# Patient Record
Sex: Female | Born: 1955 | Race: White | Hispanic: No | Smoking: Former smoker
Health system: Southern US, Community
[De-identification: ages and names within clinical notes are randomized; demographics above are authoritative.]

## PROBLEM LIST (undated history)

## (undated) DIAGNOSIS — M1990 Unspecified osteoarthritis, unspecified site: Secondary | ICD-10-CM

## (undated) DIAGNOSIS — R51 Headache: Secondary | ICD-10-CM

## (undated) DIAGNOSIS — E119 Type 2 diabetes mellitus without complications: Secondary | ICD-10-CM

## (undated) DIAGNOSIS — R011 Cardiac murmur, unspecified: Secondary | ICD-10-CM

## (undated) DIAGNOSIS — I2510 Atherosclerotic heart disease of native coronary artery without angina pectoris: Secondary | ICD-10-CM

## (undated) HISTORY — DX: Type 2 diabetes mellitus without complications: E11.9

## (undated) HISTORY — PX: TUBAL LIGATION: SHX77

## (undated) HISTORY — PX: OTHER SURGICAL HISTORY: SHX169

## (undated) HISTORY — DX: Atherosclerotic heart disease of native coronary artery without angina pectoris: I25.10

---

## 1998-08-07 ENCOUNTER — Other Ambulatory Visit: Admission: RE | Admit: 1998-08-07 | Discharge: 1998-08-07 | Payer: Self-pay | Admitting: Obstetrics and Gynecology

## 2003-07-04 ENCOUNTER — Ambulatory Visit (HOSPITAL_COMMUNITY): Admission: RE | Admit: 2003-07-04 | Discharge: 2003-07-04 | Payer: Self-pay | Admitting: Rheumatology

## 2013-01-04 ENCOUNTER — Other Ambulatory Visit: Payer: Self-pay | Admitting: Gastroenterology

## 2013-01-24 ENCOUNTER — Encounter (HOSPITAL_COMMUNITY): Payer: Self-pay | Admitting: *Deleted

## 2013-01-30 ENCOUNTER — Ambulatory Visit (HOSPITAL_COMMUNITY): Payer: BC Managed Care – PPO | Admitting: Anesthesiology

## 2013-01-30 ENCOUNTER — Ambulatory Visit (HOSPITAL_COMMUNITY)
Admission: RE | Admit: 2013-01-30 | Discharge: 2013-01-30 | Disposition: A | Discharge status: Home / Self Care | Payer: BC Managed Care – PPO | Source: Ambulatory Visit | Attending: Gastroenterology | Admitting: Gastroenterology

## 2013-01-30 ENCOUNTER — Encounter (HOSPITAL_COMMUNITY): Payer: Self-pay | Admitting: Anesthesiology

## 2013-01-30 ENCOUNTER — Encounter (HOSPITAL_COMMUNITY): Payer: Self-pay | Admitting: *Deleted

## 2013-01-30 ENCOUNTER — Encounter (HOSPITAL_COMMUNITY)
Admission: RE | Disposition: A | Discharge status: Home / Self Care | Payer: Self-pay | Source: Ambulatory Visit | Attending: Gastroenterology

## 2013-01-30 DIAGNOSIS — D126 Benign neoplasm of colon, unspecified: Secondary | ICD-10-CM | POA: Insufficient documentation

## 2013-01-30 DIAGNOSIS — K5730 Diverticulosis of large intestine without perforation or abscess without bleeding: Secondary | ICD-10-CM | POA: Insufficient documentation

## 2013-01-30 DIAGNOSIS — Z1211 Encounter for screening for malignant neoplasm of colon: Secondary | ICD-10-CM | POA: Insufficient documentation

## 2013-01-30 HISTORY — DX: Cardiac murmur, unspecified: R01.1

## 2013-01-30 HISTORY — DX: Unspecified osteoarthritis, unspecified site: M19.90

## 2013-01-30 HISTORY — PX: COLONOSCOPY WITH PROPOFOL: SHX5780

## 2013-01-30 HISTORY — DX: Headache: R51

## 2013-01-30 SURGERY — COLONOSCOPY WITH PROPOFOL
Anesthesia: Monitor Anesthesia Care

## 2013-01-30 MED ORDER — SODIUM CHLORIDE 0.9 % IV SOLN: INTRAVENOUS | Status: DC

## 2013-01-30 MED ORDER — KETAMINE HCL 10 MG/ML IJ SOLN
INTRAMUSCULAR | Status: DC | PRN
  Administered 2013-01-30 (×4): 10 mg via INTRAVENOUS

## 2013-01-30 MED ORDER — PROPOFOL 10 MG/ML IV EMUL
INTRAVENOUS | Status: DC | PRN
  Administered 2013-01-30: 100 ug/kg/min via INTRAVENOUS

## 2013-01-30 MED ORDER — MIDAZOLAM HCL 5 MG/5ML IJ SOLN
INTRAMUSCULAR | Status: DC | PRN
  Administered 2013-01-30: 2 mg via INTRAVENOUS
  Administered 2013-01-30 (×2): 1 mg via INTRAVENOUS

## 2013-01-30 MED ORDER — LACTATED RINGERS IV SOLN
INTRAVENOUS | Status: DC
  Administered 2013-01-30: 12:00:00 via INTRAVENOUS

## 2013-01-30 SURGICAL SUPPLY — 21 items

## 2013-01-30 NOTE — Anesthesia Preprocedure Evaluation (Addendum)
Anesthesia Evaluation  Patient identified by MRN, date of birth, ID band Patient awake    Reviewed: Allergy & Precautions, H&P , NPO status , Patient's Chart, lab work & pertinent test results  Airway Mallampati: II TM Distance: >3 FB Neck ROM: Full    Dental  (+) Teeth Intact and Dental Advisory Given   Pulmonary neg pulmonary ROS,  breath sounds clear to auscultation  Pulmonary exam normal       Cardiovascular negative cardio ROS  + Valvular Problems/Murmurs Rhythm:Regular Rate:Normal + Systolic murmurs    Neuro/Psych  Headaches, negative psych ROS   GI/Hepatic negative GI ROS, Neg liver ROS,   Endo/Other  Morbid obesity  Renal/GU negative Renal ROS     Musculoskeletal negative musculoskeletal ROS (+)   Abdominal   Peds  Hematology negative hematology ROS (+)   Anesthesia Other Findings   Reproductive/Obstetrics negative OB ROS                         Anesthesia Physical Anesthesia Plan  ASA: II  Anesthesia Plan: MAC   Post-op Pain Management:    Induction: Intravenous  Airway Management Planned: Nasal Cannula and Simple Face Mask  Additional Equipment:   Intra-op Plan:   Post-operative Plan:   Informed Consent: I have reviewed the patients History and Physical, chart, labs and discussed the procedure including the risks, benefits and alternatives for the proposed anesthesia with the patient or authorized representative who has indicated his/her understanding and acceptance.   Dental advisory given  Plan Discussed with: CRNA  Anesthesia Plan Comments:         Anesthesia Quick Evaluation

## 2013-01-30 NOTE — Anesthesia Postprocedure Evaluation (Signed)
Anesthesia Post Note  Patient: Susan Daniels  Procedure(s) Performed: Procedure(s) (LRB): COLONOSCOPY WITH PROPOFOL (N/A)  Anesthesia type: MAC  Patient location: PACU  Post pain: Pain level controlled  Post assessment: Post-op Vital signs reviewed  Last Vitals: BP 153/75  Pulse 77  Temp(Src) 36.4 C (Oral)  Resp 17  Ht 5\' 1"  (1.549 m)  Wt 217 lb (98.431 kg)  BMI 41.02 kg/m2  SpO2 94%  Post vital signs: Reviewed  Level of consciousness: awake  Complications: No apparent anesthesia complications

## 2013-01-30 NOTE — H&P (Signed)
  Procedure: Baseline screening colonoscopy  History: The patient is a 57 year old female born July 09, 1956 who is scheduled to undergo her first screening colonoscopy polypectomy to prevent colon cancer.  Current medications: Plaquenil. Nizoral cream.  Past medical history: Connective-tissue disease with positive ANA. Osteoarthritis. Ocular migraine syndrome. Allergic rhinitis. Bilateral tubal ligation. D&C following spontaneous abortion.  Habits: The patient has never smoked cigarettes. She consumes alcohol in moderation  Allergies: Aspirin causes increased heart rate. Epinephrine causes increased heart rate and presyncopal type symptoms.  Plan: Proceed with baseline screening colonoscopy with polypectomy to prevent colon cancer.

## 2013-01-30 NOTE — Op Note (Signed)
Procedure: Baseline screening colonoscopy  Endoscopist: Danise Edge  Premedication: Propofol administered by anesthesia  Procedure: The patient was placed in the left lateral decubitus position. Anal inspection and digital rectal exam were normal. The Pentax pediatric colonoscope was introduced into the rectum and advanced to the cecum. A normal-appearing ileocecal valve and appendiceal orifice were identified. Colonic preparation for the exam today was good.  Rectum. Normal. Retroflexed view of the distal rectum normal.  Sigmoid colon and descending colon. At 40 cm from the anal verge a 3 mm sessile polyp was removed with the cold biopsy forceps and submitted for pathologic interpretation. Left colonic diverticulosis noted.  Splenic flexure. Normal.  Transverse colon. Normal.  Hepatic flexure. Normal.  Ascending colon. Normal.  Cecum and ileocecal valve. Normal.  Assessment: From the sigmoid colon at 40 cm from the anal verge a 3 mm sessile polyp was removed with the cold biopsy forceps and submitted for pathologic interpretation. Screening proctocolonoscopy to the cecum was otherwise normal.  Recommendations: If colon polyp returns neoplastic pathologically, the patient should undergo a surveillance colonoscopy in 5 years. If the colon polyp returns nonneoplastic pathologically, the patient should undergo a repeat screening colonoscopy in 10 years.

## 2013-01-30 NOTE — Transfer of Care (Signed)
Immediate Anesthesia Transfer of Care Note  Patient: Susan Daniels  Procedure(s) Performed: Procedure(s): COLONOSCOPY WITH PROPOFOL (N/A)  Patient Location: PACU and Endoscopy Unit  Anesthesia Type:MAC  Level of Consciousness: awake, alert  and patient cooperative  Airway & Oxygen Therapy: Patient Spontanous Breathing and Patient connected to face mask oxygen  Post-op Assessment: Report given to PACU RN and Post -op Vital signs reviewed and stable  Post vital signs: Reviewed and stable  Complications: No apparent anesthesia complications

## 2013-01-31 ENCOUNTER — Encounter (HOSPITAL_COMMUNITY): Payer: Self-pay | Admitting: Gastroenterology

## 2013-07-17 ENCOUNTER — Encounter: Payer: BC Managed Care – PPO | Attending: Internal Medicine

## 2013-07-17 VITALS — Ht 62.0 in | Wt 217.6 lb

## 2013-07-17 DIAGNOSIS — E119 Type 2 diabetes mellitus without complications: Secondary | ICD-10-CM

## 2013-07-17 DIAGNOSIS — Z713 Dietary counseling and surveillance: Secondary | ICD-10-CM | POA: Insufficient documentation

## 2013-07-18 NOTE — Progress Notes (Signed)
Patient was seen on 07/17/13 for the first of a series of three diabetes self-management courses at the [facility].   Current HbA1c: patient states over 11%  The following learning objectives were met by the patient during this course:   Defines the role of glucose and insulin  Identifies type of diabetes and pathophysiology  Defines the diagnostic criteria for diabetes and prediabetes  States the risk factors for Type 2 Diabetes  States the symptoms of Type 2 Diabetes  Defines Type 2 Diabetes treatment goals  Defines Type 2 Diabetes treatment options  States the rationale for glucose monitoring  Identifies A1C, glucose targets, and testing times  Identifies proper sharps disposal  Defines the purpose of a diabetes food plan  Identifies carbohydrate food groups  Defines effects of carbohydrate foods on glucose levels  Identifies carbohydrate choices/grams/food labels  States benefits of physical activity and effect on glucose  Review of suggested activity guidelines  Handouts given during class include:  Type 2 Diabetes: Basics Book  My Food Plan Book  Food and Activity Log  Your patient has identified their diabetes self-care support plan as:  [facility] support group  Continued diabetes education  Follow-Up Plan: Attend core 2 and core 3

## 2013-07-18 NOTE — Patient Instructions (Signed)
Goals:  Follow Diabetes Meal Plan as instructed  Eat 3 meals and 2 snacks, every 3-5 hrs  Limit carbohydrate intake to 30-45 grams carbohydrate/meal  Limit carbohydrate intake to 0-30 grams carbohydrate/snack  Add lean protein foods to meals/snacks  Monitor glucose levels as instructed by your doctor  Aim for 15-30 mins of physical activity daily  Bring food record and glucose log to your next nutrition visit

## 2013-08-07 DIAGNOSIS — E119 Type 2 diabetes mellitus without complications: Secondary | ICD-10-CM

## 2013-08-07 NOTE — Progress Notes (Signed)
  Patient was seen on 08/07/13 for the second of a series of three diabetes self-management courses at the [facility]. The following learning objectives were met by the patient during this course:   Explain basic nutrition maintenance and quality assurance  Describe causes, symptoms and treatment of hypoglycemia and hyperglycemia  Explain how to manage diabetes during illness  Describe the importance of good nutrition for health and healthy eating strategies  List strategies to follow meal plan when dining out  Describe the effects of alcohol on glucose and how to use it safely  Describe problem solving skills for day-to-day glucose challenges  Describe strategies to use when treatment plan needs to change  Identify important factors involved in successful weight loss  Describe ways to remain physically active  Describe the impact of regular activity on insulin resistance   Follow-Up Plan: Patient will attend the final class of the ADA Diabetes Self-Care Education.    

## 2013-09-18 ENCOUNTER — Encounter: Payer: BC Managed Care – PPO | Attending: Internal Medicine

## 2013-09-18 DIAGNOSIS — E119 Type 2 diabetes mellitus without complications: Secondary | ICD-10-CM | POA: Insufficient documentation

## 2013-09-18 DIAGNOSIS — Z713 Dietary counseling and surveillance: Secondary | ICD-10-CM | POA: Insufficient documentation

## 2013-09-19 NOTE — Progress Notes (Signed)
Patient was seen on 09/19/23 for the third of a series of three diabetes self-management courses at the [facility]. The following learning objectives were met by the patient during this class:    State the amount of activity recommended for healthy living   Describe activities suitable for individual needs   Identify ways to regularly incorporate activity into daily life   Identify barriers to activity and ways to over come these barriers  Identify diabetes medications being personally used and their primary action for lowering glucose and possible side effects   Describe role of stress on blood glucose and develop strategies to address psychosocial issues   Identify diabetes complications and ways to prevent them  Explain how to manage diabetes during illness   Evaluate success in meeting personal goal   Establish 2-3 goals that they will plan to diligently work on until they return for the free 51-month follow-up visit  Your patient has established the following 4 month goals in their individualized success plan: I will count my carb choices at most meals and snacks I will increase my activity  20 minutes at least 3 days a week  Your patient has identified these potential barriers to change:  These behavior goals are very important to me on a scale of 10/10  I am moderately confident that I can accomplish them on a scale of 5/10 due to "life"  Your patient has identified their diabetes self-care support plan as  None stated, Support group available

## 2014-01-15 ENCOUNTER — Ambulatory Visit: Payer: BC Managed Care – PPO | Admitting: *Deleted

## 2016-05-24 DIAGNOSIS — E782 Mixed hyperlipidemia: Secondary | ICD-10-CM | POA: Diagnosis not present

## 2016-05-24 DIAGNOSIS — E1165 Type 2 diabetes mellitus with hyperglycemia: Secondary | ICD-10-CM | POA: Diagnosis not present

## 2016-05-24 DIAGNOSIS — Z79899 Other long term (current) drug therapy: Secondary | ICD-10-CM | POA: Diagnosis not present

## 2016-05-24 DIAGNOSIS — E559 Vitamin D deficiency, unspecified: Secondary | ICD-10-CM | POA: Diagnosis not present

## 2016-12-03 DIAGNOSIS — Z6837 Body mass index (BMI) 37.0-37.9, adult: Secondary | ICD-10-CM | POA: Diagnosis not present

## 2016-12-03 DIAGNOSIS — Z124 Encounter for screening for malignant neoplasm of cervix: Secondary | ICD-10-CM | POA: Diagnosis not present

## 2016-12-03 DIAGNOSIS — Z01419 Encounter for gynecological examination (general) (routine) without abnormal findings: Secondary | ICD-10-CM | POA: Diagnosis not present

## 2017-02-02 DIAGNOSIS — L249 Irritant contact dermatitis, unspecified cause: Secondary | ICD-10-CM | POA: Diagnosis not present

## 2017-02-15 DIAGNOSIS — L249 Irritant contact dermatitis, unspecified cause: Secondary | ICD-10-CM | POA: Diagnosis not present

## 2017-03-04 DIAGNOSIS — H524 Presbyopia: Secondary | ICD-10-CM | POA: Diagnosis not present

## 2017-03-07 DIAGNOSIS — E782 Mixed hyperlipidemia: Secondary | ICD-10-CM | POA: Diagnosis not present

## 2017-03-07 DIAGNOSIS — M1711 Unilateral primary osteoarthritis, right knee: Secondary | ICD-10-CM | POA: Diagnosis not present

## 2017-03-07 DIAGNOSIS — Z79899 Other long term (current) drug therapy: Secondary | ICD-10-CM | POA: Diagnosis not present

## 2017-03-07 DIAGNOSIS — E559 Vitamin D deficiency, unspecified: Secondary | ICD-10-CM | POA: Diagnosis not present

## 2017-03-07 DIAGNOSIS — E1165 Type 2 diabetes mellitus with hyperglycemia: Secondary | ICD-10-CM | POA: Diagnosis not present

## 2017-03-07 DIAGNOSIS — Z0000 Encounter for general adult medical examination without abnormal findings: Secondary | ICD-10-CM | POA: Diagnosis not present

## 2017-03-07 DIAGNOSIS — M359 Systemic involvement of connective tissue, unspecified: Secondary | ICD-10-CM | POA: Diagnosis not present

## 2017-03-25 DIAGNOSIS — M1711 Unilateral primary osteoarthritis, right knee: Secondary | ICD-10-CM | POA: Diagnosis not present

## 2017-06-01 NOTE — Progress Notes (Signed)
Office Visit Note  Patient: Susan Daniels             Date of Birth: [birth year]           MRN: [id number]             PCP: Josetta Huddle, MD Referring: Josetta Huddle, MD Visit Date: 06/03/2017 Occupation: _0 @    Subjective:  Pain in joints   History of Present Illness: Susan Daniels is a 61 y.o. female seen in consultation per request of Dr. Josetta Huddle. According to patient her symptoms started age 32 at the time she was under care of Dr. Rockwell Alexandria. He diagnosed her with rheumatoid arthritis at the time she was having right knee joint pain and pain in all other joints. She was started on Plaquenil. Once a retired she was under care of Dr. Lenna Gilford who diagnosed her with autoimmune disease. Plaquenil was continued. After that Dr. Lenna Gilford moved on and patient continued to get refill on Plaquenil through Dr. Inda Merlin. She believes that she is doing quite well on Plaquenil without any joint swelling. She's had some discomfort in her right knee joint for which she had a cortisone injection about a month ago by Dr. Ricki Rodriguez.  Her knee joint is doing better. She has some discomfort in her left CMC joint.  Activities of Daily Living:  Patient reports morning stiffness for 0 minute.   Patient Reports nocturnal pain.  Difficulty dressing/grooming: Denies Difficulty climbing stairs: Denies Difficulty getting out of chair: Denies Difficulty using hands for taps, buttons, cutlery, and/or writing: Denies   Review of Systems  Constitutional: Negative.  Negative for fatigue.  HENT: Negative for mouth dryness.   Eyes: Negative for dryness.  Respiratory: Negative.  Negative for cough, shortness of breath and difficulty breathing.   Cardiovascular: Negative.  Negative for palpitations, hypertension and irregular heartbeat.  Gastrointestinal: Negative.  Negative for blood in stool, constipation and diarrhea.  Endocrine: Negative.   Genitourinary: Negative.  Negative for nocturia.    Musculoskeletal: Positive for arthralgias, joint pain and joint swelling. Negative for myalgias, muscle weakness, morning stiffness and myalgias.  Skin: Positive for hair loss and sensitivity to sunlight. Negative for color change, rash and ulcers.  Neurological: Negative.  Negative for headaches.  Hematological: Negative.  Negative for swollen glands.  Psychiatric/Behavioral: Negative.  Negative for depressed mood and sleep disturbance. The patient is not nervous/anxious.     PMFS History:  Patient Active Problem List   Diagnosis Date Noted  . Autoimmune disease ([place]) 06/03/2017  . High risk medication use 06/03/2017  . Primary osteoarthritis of both hands 06/03/2017  . Primary osteoarthritis of both knees 06/03/2017  . Primary osteoarthritis of both feet 06/03/2017  . Heart murmur 06/03/2017  . History of hypothyroidism 06/03/2017  . Dyslipidemia 06/03/2017  . History of diabetes mellitus 06/03/2017  . Vitamin D deficiency 06/03/2017    Past Medical History:  Diagnosis Date  . Arthritis    Rheumatoid  . Diabetes mellitus without complication ([place])   . Headache(784.0)    migraines  . Heart murmur     History reviewed. No pertinent family history. Past Surgical History:  Procedure Laterality Date  . COLONOSCOPY WITH PROPOFOL N/A 01/30/2013   Procedure: COLONOSCOPY WITH PROPOFOL;  Surgeon: Garlan Fair, MD;  Location: [facility];  Service: Endoscopy;  Laterality: N/A;  . TUBAL LIGATION     Social History   Social History Narrative  . No narrative on file     Objective: Vital  Signs: BP 133/69   Pulse 79   Resp 14   Ht 5' 2" (1.575 m)   Wt 199 lb (90.3 kg)   BMI 36.40 kg/m    Physical Exam  Constitutional: She is oriented to person, place, and time. She appears well-developed and well-nourished.  HENT:  Head: Normocephalic and atraumatic.  Eyes: Conjunctivae and EOM are normal.  Neck: Normal range of motion.  Cardiovascular: Normal rate, regular rhythm,  normal heart sounds and intact distal pulses.   Pulmonary/Chest: Effort normal and breath sounds normal.  Abdominal: Soft. Bowel sounds are normal.  Lymphadenopathy:    She has no cervical adenopathy.  Neurological: She is alert and oriented to person, place, and time.  Skin: Skin is warm and dry. Capillary refill takes less than 2 seconds.  Psychiatric: She has a normal mood and affect. Her behavior is normal.  Nursing note and vitals reviewed.    Musculoskeletal Exam: C-spine and thoracic lumbar spine good range of motion. Shoulder joints elbow joints wrist joints are good range of motion. She is some thickening of CMC PIP/DIP joints with tenderness over left CMC joint. No synovitis was noted. Hip joints are good range of motion. Her right knee joint had limited extension, without effusion. Ankle joints are good range of motion. She has bilateral MTP PIP/DIP thickening consistent with osteoarthritis no synovitis was noted.  CDAI Exam: CDAI Homunculus Exam:   Tenderness:  LUE: carpometacarpal RLE: tibiofemoral  Joint Counts:  CDAI Tender Joint count: 1 CDAI Swollen Joint count: 0  Global Assessments:  Patient Global Assessment: 1 Provider Global Assessment: 1  CDAI Calculated Score: 3    Investigation: Findings:  03/08/2017 CBC normal, CMP normal except GFR 57, hemoglobin A1c 7.6 lipid panel normal, TSH normal, UA positive for bacteria treated with doxycycline vitamin D 34.6,   01/26/2010 ANA positive no titer or pattern available, C3-C4 normal RF negative, anti-CCP negative, ESR normal, CRP normal    Imaging: Xr Foot 2 Views Left  Result Date: 06/03/2017 Left first MTP narrowing and subluxation noted PIP/DIP narrowing was noted that of the other MTP narrowing was noted. No erosive changes were noted. No intertarsal joint space narrowing was noted. A small calcaneal spur was noted. Impression: These findings are consistent with osteoarthritis of the foot.  Xr Foot 2  Views Right  Result Date: 06/03/2017 First MTP narrowing and subluxation was noted. All PIP/DIP narrowing was noted. Possible erosion was noted over right fifth MTP joint. No intertarsal joint space narrowing was noted. A small calcaneal spur was noted. Impression: These findings are consistent with osteoarthritis and possible inflammatory arthritis  Xr Hand 2 View Left  Result Date: 06/03/2017 Left second and third MCP narrowing was noted. Juxta articular osteopenia was noted. She also has PIP/DIP narrowing and left CMC severe narrowing. No erosive changes were noted.  These findings are consistent with inflammatory arthritis and osteoarthritis overlap.  Xr Hand 2 View Right  Result Date: 06/03/2017 Right second and third MCP narrowing was noted. Juxta articular osteopenia was noted. She also has PIP/DIP narrowing and left CMC severe narrowing. No erosive changes were noted.  These findings are consistent with inflammatory arthritis and osteoarthritis overlap   Speciality Comments: No specialty comments available.    Procedures:  No procedures performed Allergies: Aspirin; Epinephrine; and Hydrocodone   Assessment / Plan:     Visit Diagnoses: Autoimmune disease ([place]) - History of rash, arthralgias, positive ANA, for sensitivity, treated by Dr. Trudie Reed from October 2016 to April 2017 Plaquenil -  patient has continued Plaquenil through Dr. Inda Merlin. She states she's been tolerating the medication well and her arthritis is been very well-controlled on Plaquenil. She has no synovitis on examination today. The plan is to continue her on Plaquenil. Her dose will be Plaquenil 200 mg by mouth twice a day Monday to Friday based on her height. I will check following labs to look for any underlying autoimmune antibodies. Plan: Urinalysis, Routine w reflex microscopic, Sedimentation rate, Rheumatoid factor, Cyclic citrul peptide antibody, IgG, 14-3-3 eta Protein, ANA, C3 and C4, [id number] ENA PANEL  High  risk medication use - Plan: Serum protein electrophoresis with reflex, CBC with Differential/Platelet, COMPLETE METABOLIC PANEL WITH GFR. Her next labs will be in October. She should get yearly eye exam. She states her last eye exam was within 3 months. I've given her a form so she can fill and send it to [place].  Pain in both hands - Plan: XR Hand 2 View Right, XR Hand 2 View Left  Primary osteoarthritis of both hands: She is been having chronic pain in her hands. A list of natural anti-inflammatories were given. She's been having discomfort in her left CMC joint. Given her a prescription for Voltaren gel that can be applied topically and also prescription for left CMC brace.  Primary osteoarthritis of both knees: She had increased right knee joint pain and recently which responded to cortisone injections she  has limited extension of her right knee. Patient will bring x-rays of her knee joints next visit.  Pain in both feet - Plan: XR Foot 2 Views Right, XR Foot 2 Views Left  Primary osteoarthritis of both feet: Proper fitting shoes were discussed.  Vitamin D deficiency: She is on supplement.  Her other medical problems are listed as follows:  History of diabetes mellitus  Dyslipidemia  History of hypothyroidism  Heart murmur    Orders: Orders Placed This Encounter  Procedures  . XR Hand 2 View Right  . XR Hand 2 View Left  . XR Foot 2 Views Right  . XR Foot 2 Views Left  . Urinalysis, Routine w reflex microscopic  . Sedimentation rate  . Rheumatoid factor  . Cyclic citrul peptide antibody, IgG  . 14-3-3 eta Protein  . Serum protein electrophoresis with reflex  . ANA  . C3 and C4  . [id number] ENA PANEL  . CBC with Differential/Platelet  . COMPLETE METABOLIC PANEL WITH GFR   Meds ordered this encounter  Medications  . diclofenac sodium (VOLTAREN) 1 % GEL    Sig: Apply 3 gm to 3 large joints up to 3 times a day.Dispense 3 tubes with 3 refills.    Dispense:  3 Tube     Refill:  1    Face-to-face time spent with patient was 50 minutes. Greater than 50% of time was spent in counseling and coordination of care.  Follow-Up Instructions: Return in about 3 months (around 09/03/2017) for Autoimmune disease.   Bo Merino, MD  Note - This record has been created using Editor, commissioning.  Chart creation errors have been sought, but may not always  have been located. Such creation errors do not reflect on  the standard of medical care.

## 2017-06-03 ENCOUNTER — Encounter: Payer: Self-pay | Admitting: Rheumatology

## 2017-06-03 ENCOUNTER — Ambulatory Visit (INDEPENDENT_AMBULATORY_CARE_PROVIDER_SITE_OTHER): Payer: Self-pay

## 2017-06-03 ENCOUNTER — Ambulatory Visit (INDEPENDENT_AMBULATORY_CARE_PROVIDER_SITE_OTHER): Payer: BLUE CROSS/BLUE SHIELD | Admitting: Rheumatology

## 2017-06-03 VITALS — BP 133/69 | HR 79 | Resp 14 | Ht 62.0 in | Wt 199.0 lb

## 2017-06-03 DIAGNOSIS — M79671 Pain in right foot: Secondary | ICD-10-CM

## 2017-06-03 DIAGNOSIS — M19041 Primary osteoarthritis, right hand: Secondary | ICD-10-CM

## 2017-06-03 DIAGNOSIS — Z79899 Other long term (current) drug therapy: Secondary | ICD-10-CM

## 2017-06-03 DIAGNOSIS — M19042 Primary osteoarthritis, left hand: Secondary | ICD-10-CM

## 2017-06-03 DIAGNOSIS — M19071 Primary osteoarthritis, right ankle and foot: Secondary | ICD-10-CM | POA: Diagnosis not present

## 2017-06-03 DIAGNOSIS — M79641 Pain in right hand: Secondary | ICD-10-CM

## 2017-06-03 DIAGNOSIS — D8989 Other specified disorders involving the immune mechanism, not elsewhere classified: Secondary | ICD-10-CM

## 2017-06-03 DIAGNOSIS — Z8639 Personal history of other endocrine, nutritional and metabolic disease: Secondary | ICD-10-CM | POA: Insufficient documentation

## 2017-06-03 DIAGNOSIS — R011 Cardiac murmur, unspecified: Secondary | ICD-10-CM | POA: Diagnosis not present

## 2017-06-03 DIAGNOSIS — E559 Vitamin D deficiency, unspecified: Secondary | ICD-10-CM | POA: Diagnosis not present

## 2017-06-03 DIAGNOSIS — M79642 Pain in left hand: Secondary | ICD-10-CM | POA: Diagnosis not present

## 2017-06-03 DIAGNOSIS — M170 Bilateral primary osteoarthritis of knee: Secondary | ICD-10-CM | POA: Diagnosis not present

## 2017-06-03 DIAGNOSIS — M79672 Pain in left foot: Secondary | ICD-10-CM

## 2017-06-03 DIAGNOSIS — E785 Hyperlipidemia, unspecified: Secondary | ICD-10-CM | POA: Insufficient documentation

## 2017-06-03 DIAGNOSIS — M19072 Primary osteoarthritis, left ankle and foot: Secondary | ICD-10-CM

## 2017-06-03 DIAGNOSIS — M359 Systemic involvement of connective tissue, unspecified: Secondary | ICD-10-CM

## 2017-06-03 MED ORDER — DICLOFENAC SODIUM 1 % TD GEL: TRANSDERMAL | 1 refills | Status: DC

## 2017-06-03 NOTE — Patient Instructions (Signed)
Standing Labs We placed an order today for your standing lab work.    Please come back and get your standing labs in October  We have open lab Monday through Friday from 8:30-11:30 AM and 1:30-4 PM at the office of Dr. Bo Merino.   The office is located at [address], [place], [place], [place] [address] No appointment is necessary.   Labs are drawn by [facility].  You may receive a bill from [facility] for your lab work. If you have any questions regarding directions or hours of operation,  please call [phone].   Natural anti-inflammatories  You can purchase these at [employer], [employer] or online.  . Turmeric (capsules)  . Ginger (ginger root or capsules)  . Omega 3 (Fish, flax seeds, chia seeds, walnuts, almonds)  . Tart cherry (dried or extract)   Patient should be under the care of a physician while taking these supplements. This may not be reproduced without the permission of Dr. Bo Merino.

## 2017-06-04 LAB — URINALYSIS, ROUTINE W REFLEX MICROSCOPIC
Bilirubin Urine: NEGATIVE
Hgb urine dipstick: NEGATIVE
KETONES UR: NEGATIVE
NITRITE: NEGATIVE
PH: 5 (ref 5.0–8.0)
Protein, ur: NEGATIVE
SPECIFIC GRAVITY, URINE: 1.013 (ref 1.001–1.035)

## 2017-06-04 LAB — URINALYSIS, MICROSCOPIC ONLY
Casts: NONE SEEN [LPF]
Crystals: NONE SEEN [HPF]
RBC / HPF: NONE SEEN RBC/HPF (ref <=2)
SQUAMOUS EPITHELIAL / LPF: NONE SEEN [HPF] (ref <=5)
Yeast: NONE SEEN [HPF]

## 2017-06-04 LAB — SEDIMENTATION RATE: Sed Rate: 6 mm/hr (ref 0–30)

## 2017-06-06 LAB — ANTI-NUCLEAR AB-TITER (ANA TITER)

## 2017-06-06 LAB — CP5000020 ENA PANEL
ENA SM Ab Ser-aCnc: <1
Ribonucleic Protein(ENA) Antibody, IgG: <1
SSA (Ro) (ENA) Antibody, IgG: <1
SSB (La) (ENA) Antibody, IgG: <1
Scleroderma (Scl-70) (ENA) Antibody, IgG: <1
ds DNA Ab: <1 IU/mL

## 2017-06-06 LAB — RHEUMATOID FACTOR

## 2017-06-06 LAB — C3 AND C4
C3 Complement: 159 mg/dL (ref 83–193)
C4 COMPLEMENT: 30 mg/dL (ref 15–57)

## 2017-06-06 LAB — CYCLIC CITRUL PEPTIDE ANTIBODY, IGG

## 2017-06-06 LAB — ANA: ANA: POSITIVE — AB

## 2017-06-07 LAB — 14-3-3 ETA PROTEIN: 14-3-3 eta Protein: <0.2 ng/mL (ref <0.2)

## 2017-06-08 LAB — PROTEIN ELECTROPHORESIS, SERUM, WITH REFLEX
ALBUMIN ELP: 4.1 g/dL (ref 3.8–4.8)
Alpha-1-Globulin: 0.3 g/dL (ref 0.2–0.3)
Alpha-2-Globulin: 0.7 g/dL (ref 0.5–0.9)
BETA GLOBULIN: 0.5 g/dL (ref 0.4–0.6)
Beta 2: 0.4 g/dL (ref 0.2–0.5)
Gamma Globulin: 0.8 g/dL (ref 0.8–1.7)
TOTAL PROTEIN, SERUM ELECTROPHOR: 6.6 g/dL (ref 6.1–8.1)

## 2017-06-08 LAB — IFE INTERPRETATION: Immunofix Electr Int: NOT DETECTED

## 2017-06-08 NOTE — Progress Notes (Signed)
Please add anti-cardiolipin, beta-2, lupus anticoagulant

## 2017-06-09 ENCOUNTER — Other Ambulatory Visit: Payer: Self-pay | Admitting: Radiology

## 2017-06-09 ENCOUNTER — Telehealth: Payer: Self-pay

## 2017-06-09 MED ORDER — DICLOFENAC SODIUM 1 % TD GEL: TRANSDERMAL | 3 refills | Status: DC

## 2017-06-09 NOTE — Telephone Encounter (Signed)
Resent Rx per [facility] the sig states 3 refills, but only 1 refill showed in the refill spot. Have resent to correctly reflect the 3 refills indicated on Rx

## 2017-06-09 NOTE — Telephone Encounter (Signed)
Received a prior authorization request for Voltaren Gel though cover my meds. The request asks if patient has tried two different prescription strength generic NSAIDs for her condition. Called patient to verify what she has used in the past. Left message for patient to call back. Will continue with authorization once we hear back from her.   Inocente Krach, [place], CPhT 1:37 PM

## 2017-06-10 ENCOUNTER — Other Ambulatory Visit: Payer: Self-pay | Admitting: *Deleted

## 2017-06-10 DIAGNOSIS — M2550 Pain in unspecified joint: Secondary | ICD-10-CM

## 2017-06-10 NOTE — Telephone Encounter (Signed)
Patient returned call. She states that she has not taken any NSAIDs or Rx strength medications in the past for her OA. Will will contact her once we receive a response. Patient voiced understanding and denied any questions at this time.    The authorization was submitted to her insurance via cover my meds. Will update once we receive a response.  Antigone Crowell, [place], CPhT 4:18 PM

## 2017-06-13 NOTE — Telephone Encounter (Signed)
Received a fax from [place] regarding a prior authorization DENIAL for Voltaren Gel. Patients plan requires her to try two different prescription strength NSAIDs first.    Reference [id number] Phone number: [phone]  Will scan document into epic.  Raelan Burgoon, [place], CPhT 10:35 AM

## 2017-06-14 DIAGNOSIS — L237 Allergic contact dermatitis due to plants, except food: Secondary | ICD-10-CM | POA: Diagnosis not present

## 2017-06-27 ENCOUNTER — Other Ambulatory Visit: Payer: Self-pay | Admitting: Radiology

## 2017-06-27 DIAGNOSIS — Z79899 Other long term (current) drug therapy: Secondary | ICD-10-CM | POA: Diagnosis not present

## 2017-06-27 DIAGNOSIS — M2550 Pain in unspecified joint: Secondary | ICD-10-CM

## 2017-06-27 LAB — CBC WITH DIFFERENTIAL/PLATELET
BASOS ABS: 59 {cells}/uL (ref 0–200)
BASOS PCT: 1 %
EOS ABS: 354 {cells}/uL (ref 15–500)
Eosinophils Relative: 6 %
HEMATOCRIT: 39.4 % (ref 35.0–45.0)
Hemoglobin: 12.9 g/dL (ref 11.7–15.5)
LYMPHS PCT: 23 %
Lymphs Abs: 1357 cells/uL (ref 850–3900)
MCH: 30.3 pg (ref 27.0–33.0)
MCHC: 32.7 g/dL (ref 32.0–36.0)
MCV: 92.5 fL (ref 80.0–100.0)
MONO ABS: 413 {cells}/uL (ref 200–950)
MPV: 11.2 fL (ref 7.5–12.5)
Monocytes Relative: 7 %
NEUTROS PCT: 63 %
Neutro Abs: 3717 cells/uL (ref 1500–7800)
Platelets: 244 10*3/uL (ref 140–400)
RBC: 4.26 MIL/uL (ref 3.80–5.10)
RDW: 13.6 % (ref 11.0–15.0)
WBC: 5.9 10*3/uL (ref 3.8–10.8)

## 2017-06-28 LAB — COMPLETE METABOLIC PANEL WITH GFR
ALT: 17 U/L (ref 6–29)
AST: 12 U/L (ref 10–35)
Albumin: 4.2 g/dL (ref 3.6–5.1)
Alkaline Phosphatase: 53 U/L (ref 33–130)
BUN: 13 mg/dL (ref 7–25)
CALCIUM: 9.3 mg/dL (ref 8.6–10.4)
CHLORIDE: 103 mmol/L (ref 98–110)
CO2: 27 mmol/L (ref 20–32)
CREATININE: 1.03 mg/dL — AB (ref 0.50–0.99)
GFR, Est African American: 68 mL/min (ref >=60)
GFR, Est Non African American: 59 mL/min — ABNORMAL LOW (ref >=60)
GLUCOSE: 260 mg/dL — AB (ref 65–99)
Potassium: 4.4 mmol/L (ref 3.5–5.3)
Sodium: 139 mmol/L (ref 135–146)
TOTAL PROTEIN: 6.4 g/dL (ref 6.1–8.1)
Total Bilirubin: 0.5 mg/dL (ref 0.2–1.2)

## 2017-06-28 LAB — BETA-2 GLYCOPROTEIN ANTIBODIES
Beta-2 Glyco I IgG: <9 SGU (ref <=20)
Beta-2-Glycoprotein I IgM: <9 SMU (ref <=20)

## 2017-06-29 LAB — RFX PTT-LA W/RFX TO HEX PHASE CONF: PTT-LA Screen: 40 s (ref <=40)

## 2017-06-29 LAB — RFX DRVVT SCR W/RFLX CONF 1:1 MIX: DRVVT SCREEN: 44 s (ref <=45)

## 2017-06-29 LAB — LUPUS ANTICOAGULANT PANEL

## 2017-06-30 LAB — CARDIOLIPIN ANTIBODIES, IGG, IGM, IGA

## 2017-06-30 NOTE — Progress Notes (Signed)
Will discuss labs at the follow-up visit

## 2017-07-01 ENCOUNTER — Ambulatory Visit: Payer: Self-pay | Admitting: Rheumatology

## 2017-09-03 NOTE — Progress Notes (Signed)
Office Visit Note  Patient: Susan Daniels             Date of Birth: 1955-12-24           MRN: [id number]             PCP: Josetta Huddle, MD Referring: Josetta Huddle, MD Visit Date: 09/14/2017 Occupation: _0 @    Subjective:  Right knee pain   History of Present Illness: Susan Daniels is a 61 y.o. female with history of autoimmune disease and osteoarthritis. She continues to have some discomfort in her right knee joint. She's been tolerating Plaquenil well. She denies any joint swelling.  Activities of Daily Living:  Patient reports morning stiffness for 2 minutes.   Patient Denies nocturnal pain.  Difficulty dressing/grooming: Denies Difficulty climbing stairs: Reports Difficulty getting out of chair: Denies Difficulty using hands for taps, buttons, cutlery, and/or writing: Denies   Review of Systems  Constitutional: Negative for fatigue, night sweats, weight gain, weight loss and weakness.  HENT: Negative for mouth sores, trouble swallowing, trouble swallowing, mouth dryness and nose dryness.   Eyes: Negative for pain, redness, visual disturbance and dryness.  Respiratory: Negative for cough, shortness of breath and difficulty breathing.   Cardiovascular: Negative for chest pain, palpitations, hypertension, irregular heartbeat and swelling in legs/feet.  Gastrointestinal: Negative for blood in stool, constipation and diarrhea.  Endocrine: Negative for increased urination.  Genitourinary: Negative for vaginal dryness.  Musculoskeletal: Positive for arthralgias, joint pain and morning stiffness. Negative for joint swelling, myalgias, muscle weakness, muscle tenderness and myalgias.  Skin: Negative for color change, rash, hair loss, skin tightness, ulcers and sensitivity to sunlight.  Allergic/Immunologic: Negative for susceptible to infections.  Neurological: Negative for dizziness, memory loss and night sweats.  Hematological: Negative for swollen glands.    Psychiatric/Behavioral: Negative for depressed mood and sleep disturbance. The patient is not nervous/anxious.     PMFS History:  Patient Active Problem List   Diagnosis Date Noted  . Autoimmune disease ([place]) 06/03/2017  . High risk medication use 06/03/2017  . Primary osteoarthritis of both hands 06/03/2017  . Primary osteoarthritis of both knees 06/03/2017  . Primary osteoarthritis of both feet 06/03/2017  . Heart murmur 06/03/2017  . History of hypothyroidism 06/03/2017  . Dyslipidemia 06/03/2017  . History of diabetes mellitus 06/03/2017  . Vitamin D deficiency 06/03/2017    Past Medical History:  Diagnosis Date  . Arthritis    Rheumatoid  . Diabetes mellitus without complication ([place])   . Headache(784.0)    migraines  . Heart murmur     Family History  Problem Relation Age of Onset  . Heart disease Father   . Rheumatic fever Father   . Hypertension Sister   . Rheumatic fever Sister    Past Surgical History:  Procedure Laterality Date  . TUBAL LIGATION     Social History   Social History Narrative  . Not on file     Objective: Vital Signs: BP (!) 141/66 (BP Location: Left Arm, Patient Position: Sitting, Cuff Size: Normal)   Pulse 81   Resp 15   Ht _1  (1.575 m)   Wt 206 lb (93.4 kg)   BMI 37.68 kg/m    Physical Exam  Constitutional: She is oriented to person, place, and time. She appears well-developed and well-nourished.  HENT:  Head: Normocephalic and atraumatic.  Eyes: Conjunctivae and EOM are normal.  Neck: Normal range of motion.  Cardiovascular: Normal rate, regular rhythm, normal heart sounds and  intact distal pulses.  Pulmonary/Chest: Effort normal and breath sounds normal.  Abdominal: Soft. Bowel sounds are normal.  Lymphadenopathy:    She has no cervical adenopathy.  Neurological: She is alert and oriented to person, place, and time.  Skin: Skin is warm and dry. Capillary refill takes less than 2 seconds.  Psychiatric: She has a  normal mood and affect. Her behavior is normal.  Nursing note and vitals reviewed.    Musculoskeletal Exam: c-spine and thoracic lumbar spine good range of motion. Shoulder joints elbow joints wrist joints good range of motion. She has DIP PIP thickening in her bilateral hands consistent with osteoarthritis. No synovitis was noted. Sheet crepitus in her bilateral knee joints without any warmth swelling or effusion. She is some osteoarthritic changes in her feet. All the joints are good range of motion.  CDAI Exam: No CDAI exam completed.    Investigation: Findings:  Patient brought x-rays of her bilateral knee joints from August 2016 there were done at [facility]. Right knee joint showed severe medial compartment narrowing and medial and lateral osteophytes. These findings are consistent with severe osteoarthritis of the right knee joint. Left knee joints showed moderate medial compartment narrowing consistent moderate osteoarthritis. PLQ eye exam11/05/2017 per patient. Results are pending. CBC Latest Ref Rng & Units 06/27/2017  WBC 3.8 - 10.8 K/uL 5.9  Hemoglobin 11.7 - 15.5 g/dL 12.9  Hematocrit 35.0 - 45.0 % 39.4  Platelets 140 - 400 K/uL 244   CMP Latest Ref Rng & Units 06/27/2017  Glucose 65 - 99 mg/dL 260(H)  BUN 7 - 25 mg/dL 13  Creatinine 0.50 - 0.99 mg/dL 1.03(H)  Sodium 135 - 146 mmol/L 139  Potassium 3.5 - 5.3 mmol/L 4.4  Chloride 98 - 110 mmol/L 103  CO2 20 - 32 mmol/L 27  Calcium 8.6 - 10.4 mg/dL 9.3  Total Protein 6.1 - 8.1 g/dL 6.4  Total Bilirubin 0.2 - 1.2 mg/dL 0.5  Alkaline Phos 33 - 130 U/L 53  AST 10 - 35 U/L 12  ALT 6 - 29 U/L 17  ANA 1:320 homogeneous, ENA negative, beta 2 negative, anticardiolipin negative, lupus anticoagulant negative, IFE negative,RF negative, anti-CCP negative, 14 33 eta negative ESR 6, C3-C4 normal  Imaging: No results found.  Speciality Comments: No specialty comments available.    Procedures:  No procedures  performed Allergies: Aspirin; Epinephrine; and Hydrocodone   Assessment / Plan:     Visit Diagnoses: Autoimmune disease ([place]) - History of rash, arthralgias, positive ANA, for sensitivity, treated by Dr. Trudie Reed from October 2016 to April 2017 Plaquenil -patient has continued Plaquenil . She denies any joint swelling or inflammation  High risk medication use - PLQ 200 mg bid M-F - Plan: CBC with Differential/Platelet, COMPLETE METABOLIC PANEL WITH GFRtoday and then every 5 months. She does have low creatinine. I do not have any previous values to compare. Patient states she had an eye exam today. I do not have results available at this point.  Primary osteoarthritis of both hands: Joint protection and muscle strengthening discussed.  Primary osteoarthritis of both knees - Right knee severe, left knee moderate. Patient brought x-rays from [place] which were reviewed. Weight loss diet and exercise were discussed. Lower extremity muscle strengthening exercises were discussed.  Primary osteoarthritis of both feet: Proper fitting shoes were discussed.  Her other medical problems are listed as follows:  History of diabetes mellitus  History of vitamin D deficiency - She is on supplement.  History of hypothyroidism  Heart murmur  Dyslipidemia    Orders: Orders Placed This Encounter  Procedures  . CBC with Differential/Platelet  . COMPLETE METABOLIC PANEL WITH GFR   No orders of the defined types were placed in this encounter.   Face-to-face time spent with patient was 30 minutes.greater than 50% of time was spent in counseling and coordination of care.  Follow-Up Instructions: Return in about 6 months (around 03/14/2018) for Autoimmune disease, Osteoarthritis.   Bo Merino, MD  Note - This record has been created using Editor, commissioning.  Chart creation errors have been sought, but may not always  have been located. Such creation errors do not reflect on  the  standard of medical care.

## 2017-09-14 ENCOUNTER — Encounter (INDEPENDENT_AMBULATORY_CARE_PROVIDER_SITE_OTHER): Payer: Self-pay

## 2017-09-14 ENCOUNTER — Ambulatory Visit: Payer: BLUE CROSS/BLUE SHIELD | Admitting: Rheumatology

## 2017-09-14 ENCOUNTER — Encounter: Payer: Self-pay | Admitting: Rheumatology

## 2017-09-14 VITALS — BP 141/66 | HR 81 | Resp 15 | Ht 62.0 in | Wt 206.0 lb

## 2017-09-14 DIAGNOSIS — M19071 Primary osteoarthritis, right ankle and foot: Secondary | ICD-10-CM | POA: Diagnosis not present

## 2017-09-14 DIAGNOSIS — M19041 Primary osteoarthritis, right hand: Secondary | ICD-10-CM | POA: Diagnosis not present

## 2017-09-14 DIAGNOSIS — D8989 Other specified disorders involving the immune mechanism, not elsewhere classified: Secondary | ICD-10-CM | POA: Diagnosis not present

## 2017-09-14 DIAGNOSIS — E785 Hyperlipidemia, unspecified: Secondary | ICD-10-CM | POA: Diagnosis not present

## 2017-09-14 DIAGNOSIS — M19042 Primary osteoarthritis, left hand: Secondary | ICD-10-CM | POA: Diagnosis not present

## 2017-09-14 DIAGNOSIS — M19072 Primary osteoarthritis, left ankle and foot: Secondary | ICD-10-CM | POA: Diagnosis not present

## 2017-09-14 DIAGNOSIS — Z8639 Personal history of other endocrine, nutritional and metabolic disease: Secondary | ICD-10-CM | POA: Diagnosis not present

## 2017-09-14 DIAGNOSIS — Z79899 Other long term (current) drug therapy: Secondary | ICD-10-CM

## 2017-09-14 DIAGNOSIS — M170 Bilateral primary osteoarthritis of knee: Secondary | ICD-10-CM | POA: Diagnosis not present

## 2017-09-14 DIAGNOSIS — M359 Systemic involvement of connective tissue, unspecified: Secondary | ICD-10-CM

## 2017-09-14 DIAGNOSIS — R011 Cardiac murmur, unspecified: Secondary | ICD-10-CM | POA: Diagnosis not present

## 2017-09-14 LAB — COMPLETE METABOLIC PANEL WITH GFR
AG Ratio: 1.7 (calc) (ref 1.0–2.5)
ALT: 14 U/L (ref 6–29)
AST: 12 U/L (ref 10–35)
Albumin: 4.2 g/dL (ref 3.6–5.1)
Alkaline phosphatase (APISO): 53 U/L (ref 33–130)
BUN/Creatinine Ratio: 14 (calc) (ref 6–22)
BUN: 17 mg/dL (ref 7–25)
CALCIUM: 9.1 mg/dL (ref 8.6–10.4)
CO2: 29 mmol/L (ref 20–32)
CREATININE: 1.23 mg/dL — AB (ref 0.50–0.99)
Chloride: 102 mmol/L (ref 98–110)
GFR, EST AFRICAN AMERICAN: 55 mL/min/{1.73_m2} — AB (ref >=60)
GFR, EST NON AFRICAN AMERICAN: 47 mL/min/{1.73_m2} — AB (ref >=60)
Globulin: 2.5 g/dL (calc) (ref 1.9–3.7)
Glucose, Bld: 207 mg/dL — ABNORMAL HIGH (ref 65–99)
Potassium: 4.2 mmol/L (ref 3.5–5.3)
Sodium: 139 mmol/L (ref 135–146)
TOTAL PROTEIN: 6.7 g/dL (ref 6.1–8.1)
Total Bilirubin: 0.4 mg/dL (ref 0.2–1.2)

## 2017-09-14 LAB — CBC WITH DIFFERENTIAL/PLATELET
BASOS PCT: 1.3 %
Basophils Absolute: 90 cells/uL (ref 0–200)
EOS PCT: 4.4 %
Eosinophils Absolute: 304 cells/uL (ref 15–500)
HEMATOCRIT: 37.3 % (ref 35.0–45.0)
Hemoglobin: 12.9 g/dL (ref 11.7–15.5)
LYMPHS ABS: 1573 {cells}/uL (ref 850–3900)
MCH: 30.9 pg (ref 27.0–33.0)
MCHC: 34.6 g/dL (ref 32.0–36.0)
MCV: 89.2 fL (ref 80.0–100.0)
MPV: 12 fL (ref 7.5–12.5)
Monocytes Relative: 9.3 %
NEUTROS PCT: 62.2 %
Neutro Abs: 4292 cells/uL (ref 1500–7800)
PLATELETS: 244 10*3/uL (ref 140–400)
RBC: 4.18 10*6/uL (ref 3.80–5.10)
RDW: 11.8 % (ref 11.0–15.0)
TOTAL LYMPHOCYTE: 22.8 %
WBC: 6.9 10*3/uL (ref 3.8–10.8)
WBCMIX: 642 {cells}/uL (ref 200–950)

## 2017-09-14 NOTE — Patient Instructions (Signed)
Standing Labs We placed an order today for your standing lab work.    Please come back and get your standing labs in April  We have open lab Monday through Friday from 8:30-11:30 AM and 1:30-4 PM at the office of Dr. Bo Merino.   The office is located at [address], [place], [place], [place] [address] No appointment is necessary.   Labs are drawn by [facility].  You may receive a bill from [facility] for your lab work. If you have any questions regarding directions or hours of operation,  please call [phone].

## 2017-09-15 NOTE — Progress Notes (Signed)
Please make sure the patient is not taking any NSAIDs. Advised her to discontinue Voltaren gel.

## 2017-10-14 ENCOUNTER — Telehealth: Payer: Self-pay | Admitting: Rheumatology

## 2017-10-14 NOTE — Telephone Encounter (Signed)
Patient called stating that she is wanting to get a cortisone injection in her right knee.  CB#[phone].  Thank you.

## 2017-10-14 NOTE — Telephone Encounter (Signed)
Patient offered an appointment for 10/14/17 and patient declined stating she was leaving town. Patient advised would contact her next week with availability. Patient verbalized understanding.

## 2018-01-06 NOTE — Progress Notes (Addendum)
Office Visit Note  Patient: Susan Daniels             Date of Birth: 1955-12-24           MRN: [id number]             PCP: Josetta Huddle, MD Referring: Josetta Huddle, MD Visit Date: 01/10/2018 Occupation: @GUAROCC @    Subjective:  Right knee pain   History of Present Illness: Susan Daniels is a 62 y.o. female with history of autoimmune disease and osteoarthritis.  Patient states she has been having increased right knee pain.  She states her right knee has been swelling and is she has been having increased joint stiffness.  She denies any recent injuries or falls.  She denies any mechanical symptoms.  States her left knee has been doing well.  She states that over the weekend she was having increased pain in her right ankle, bilateral wrists, and bilateral hips.  She took Aleve and Tylenol for pain relief.  She ports the pain in her right ankle bilateral wrist and bilateral hips has since resolved.  She states her hands and feet are doing well.    Activities of Daily Living:  Patient reports morning stiffness for 24 hours.   Patient Denies nocturnal pain.  Difficulty dressing/grooming: Denies Difficulty climbing stairs: Reports Difficulty getting out of chair: Denies Difficulty using hands for taps, buttons, cutlery, and/or writing: Denies   Review of Systems  Constitutional: Positive for activity change and weakness. Negative for fatigue.  HENT: Negative for mouth sores, trouble swallowing, trouble swallowing, mouth dryness and nose dryness.   Eyes: Negative for pain, redness, visual disturbance and dryness.  Respiratory: Negative for cough and shortness of breath.   Cardiovascular: Negative for chest pain, palpitations, hypertension and swelling in legs/feet.  Gastrointestinal: Negative for blood in stool, constipation and diarrhea.  Endocrine: Negative for cold intolerance and increased urination.  Genitourinary: Negative for difficulty urinating.  Musculoskeletal:  Positive for joint swelling and morning stiffness. Negative for arthralgias, joint pain, myalgias, muscle weakness, muscle tenderness and myalgias.  Skin: Positive for sensitivity to sunlight. Negative for color change, rash, hair loss, nodules/bumps, redness, skin tightness and ulcers.  Allergic/Immunologic: Negative for susceptible to infections.  Neurological: Negative for dizziness, numbness and headaches.  Hematological: Negative for bruising/bleeding tendency and swollen glands.  Psychiatric/Behavioral: Positive for sleep disturbance. Negative for depressed mood. The patient is not nervous/anxious.     PMFS History:  Patient Active Problem List   Diagnosis Date Noted  . Autoimmune disease ([place]) 06/03/2017  . High risk medication use 06/03/2017  . Primary osteoarthritis of both hands 06/03/2017  . Primary osteoarthritis of both knees 06/03/2017  . Primary osteoarthritis of both feet 06/03/2017  . Heart murmur 06/03/2017  . History of hypothyroidism 06/03/2017  . Dyslipidemia 06/03/2017  . History of diabetes mellitus 06/03/2017  . Vitamin D deficiency 06/03/2017    Past Medical History:  Diagnosis Date  . Arthritis    Rheumatoid  . Diabetes mellitus without complication ([place])   . Headache(784.0)    migraines  . Heart murmur     Family History  Problem Relation Age of Onset  . Heart disease Father   . Rheumatic fever Father   . Hypertension Sister   . Rheumatic fever Sister    Past Surgical History:  Procedure Laterality Date  . COLONOSCOPY WITH PROPOFOL N/A 01/30/2013   Procedure: COLONOSCOPY WITH PROPOFOL;  Surgeon: Garlan Fair, MD;  Location: [facility];  Service:  Endoscopy;  Laterality: N/A;  . TUBAL LIGATION     Social History   Social History Narrative  . Not on file     Objective: Vital Signs: BP 132/81 (BP Location: Left Arm, Patient Position: Sitting, Cuff Size: Normal)   Pulse 76   Resp 16   Ht 5\' 2"  (1.575 m)   Wt 206 lb (93.4 kg)   BMI  37.68 kg/m    Physical Exam  Constitutional: She is oriented to person, place, and time. She appears well-developed and well-nourished.  HENT:  Head: Normocephalic and atraumatic.  Eyes: Conjunctivae and EOM are normal.  Neck: Normal range of motion.  Cardiovascular: Normal rate, regular rhythm and intact distal pulses.  Murmur heard. Pulmonary/Chest: Effort normal and breath sounds normal.  Abdominal: Soft. Bowel sounds are normal.  Lymphadenopathy:    She has no cervical adenopathy.  Neurological: She is alert and oriented to person, place, and time.  Skin: Skin is warm and dry. Capillary refill takes less than 2 seconds.  Psychiatric: She has a normal mood and affect. Her behavior is normal.  Nursing note and vitals reviewed.    Musculoskeletal Exam: C-spine, thoracic spine, lumbar spine good range of motion.  No midline spinal tenderness.  No SI joint tenderness. Shoulder joints, elbow joints, wrist joints, MCPs, PIPs,and DIPs good ROM with no synovitis.  Hip joints good range of motion.  No tenderness trochanteric bursa bilaterally.  Right knee warmth she has limited extension.  Bilateral knee crepitus.  Good range of motion of her left knee with no warmth or effusion.  Ankle joints, MTPs, PIPs, DIPs good range of motion with no synovitis.  CDAI Exam: No CDAI exam completed.    Investigation: No additional findings.  PLQ eye exam: 09/15/2017 Normal. Dr. Dyke Maes. Follow up in 1 year. CBC Latest Ref Rng & Units 09/14/2017 06/27/2017  WBC 3.8 - 10.8 Thousand/uL 6.9 5.9  Hemoglobin 11.7 - 15.5 g/dL 12.9 12.9  Hematocrit 35.0 - 45.0 % 37.3 39.4  Platelets 140 - 400 Thousand/uL 244 244   CMP Latest Ref Rng & Units 09/14/2017 06/27/2017  Glucose 65 - 99 mg/dL 207(H) 260(H)  BUN 7 - 25 mg/dL 17 13  Creatinine 0.50 - 0.99 mg/dL 1.23(H) 1.03(H)  Sodium 135 - 146 mmol/L 139 139  Potassium 3.5 - 5.3 mmol/L 4.2 4.4  Chloride 98 - 110 mmol/L 102 103  CO2 20 - 32 mmol/L 29 27    Calcium 8.6 - 10.4 mg/dL 9.1 9.3  Total Protein 6.1 - 8.1 g/dL 6.7 6.4  Total Bilirubin 0.2 - 1.2 mg/dL 0.4 0.5  Alkaline Phos 33 - 130 U/L - 53  AST 10 - 35 U/L 12 12  ALT 6 - 29 U/L 14 17    Imaging: No results found.  Speciality Comments: PLQ eye exam: 09/15/2017 Normal. Dr. Dyke Maes. Follow up in 1 year.    Procedures:  Large Joint Inj: R knee on 01/10/2018 8:40 AM Indications: pain Details: 27 G 1.5 in needle, medial approach  Arthrogram: No  Medications: 1.5 mL lidocaine 1 %; 40 mg triamcinolone acetonide 40 MG/ML Aspirate: 0 mL Outcome: tolerated well, no immediate complications Procedure, treatment alternatives, risks and benefits explained, specific risks discussed. Consent was given by the patient. Immediately prior to procedure a time out was called to verify the correct patient, procedure, equipment, support staff and site/side marked as required. Patient was prepped and draped in the usual sterile fashion.     Allergies: Aspirin; Epinephrine; and Hydrocodone  Assessment / Plan:     Visit Diagnoses: Autoimmune disease ([place])  She has no clinical features of autoimmune disease at this time.  No oral or nasal ulcers, no lymphadenopathy, no recent fevers, chills, or fatigue.  She denies any rashes. No synovitis was noted on exam. She is on Plaquenil 200 mg BID Monday-Friday. She does not need any refills. She is due for her next eye exam in November 2019.  Autoimmune labs were ordered today.- Plan: CBC with Differential/Platelet, COMPLETE METABOLIC PANEL WITH GFR, Urinalysis, Routine w reflex microscopic, ANA, Anti-DNA antibody, double-stranded, C3 and C4, VITAMIN D 25 Hydroxy (Vit-D Deficiency, Fractures), Sedimentation rate  High risk medication use - PLQ 200 mg bid M-F. PLQ eye exam 09/15/2017 Normal. She is followed by Dr. Dyke Maes. Follow up in 1 year for eye exam. CBC/CMP:09/14/17.  CBC and CMP were ordered today to monitor for drug toxicity.   Primary  osteoarthritis of both hands: She has no synovitis on exam.  She has complete fist formation bilaterally.  She has no discomfort.  Joint protection and muscle strengthening were discussed.   Primary osteoarthritis of both knees: She has bilateral knee crepitus.  Right knee pain with ROM.  She has limited extension.  Right knee warmth with no effusion.  She requested a cortisone injection today.  She tolerated the procedure well.  She was given a handout of knee exercises she can perform at home.   Chronic pain of right knee -She has warmth of her right knee and limited extension.  She has right knee crepitus.  Her right knee has been causing more discomfort since December 2018.  She denies any injuries, falls, or mechanical symptoms. She requested a cortisone injection of her right knee.  She tolerated the procedure well.  She was advised to monitor her blood pressure and blood glucose.She was given a handout of knee exercises she can do at home. Plan: Large Joint Inj: R knee   Primary osteoarthritis of both feet: She has no discomfort at this time.  She wears proper fitting shoes.  History of diabetes mellitus: She was advised to monitor her blood glucose levels.  If she has elevation in her blood glucose she was advised to follow up with her PCP.  History of vitamin D deficiency -Vitamin D level was ordered today. Plan: VITAMIN D 25 Hydroxy (Vit-D Deficiency, Fractures)  Other medical conditions are listed as follows:   History of hypothyroidism  Heart murmur  Dyslipidemia     Orders: Orders Placed This Encounter  Procedures  . Large Joint Inj: R knee  . CBC with Differential/Platelet  . COMPLETE METABOLIC PANEL WITH GFR  . Urinalysis, Routine w reflex microscopic  . ANA  . Anti-DNA antibody, double-stranded  . C3 and C4  . VITAMIN D 25 Hydroxy (Vit-D Deficiency, Fractures)  . Sedimentation rate   No orders of the defined types were placed in this encounter.   Face-to-face  time spent with patient was 30 minutes. >50% of time was spent in counseling and coordination of care.  Follow-Up Instructions: Return for Autoimmune Disease, Osteoarthritis.   Ofilia Neas, PA-C   I examined and evaluated the patient with Hazel Sams PA. The plan of care was discussed as noted above.  Bo Merino, MD  Note - This record has been created using Editor, commissioning.  Chart creation errors have been sought, but may not always  have been located. Such creation errors do not reflect on  the standard of medical care.

## 2018-01-10 ENCOUNTER — Encounter: Payer: Self-pay | Admitting: Physician Assistant

## 2018-01-10 ENCOUNTER — Ambulatory Visit: Payer: BLUE CROSS/BLUE SHIELD | Admitting: Physician Assistant

## 2018-01-10 ENCOUNTER — Encounter (INDEPENDENT_AMBULATORY_CARE_PROVIDER_SITE_OTHER): Payer: Self-pay

## 2018-01-10 VITALS — BP 132/81 | HR 76 | Resp 16 | Ht 62.0 in | Wt 206.0 lb

## 2018-01-10 DIAGNOSIS — D8989 Other specified disorders involving the immune mechanism, not elsewhere classified: Secondary | ICD-10-CM | POA: Diagnosis not present

## 2018-01-10 DIAGNOSIS — Z79899 Other long term (current) drug therapy: Secondary | ICD-10-CM

## 2018-01-10 DIAGNOSIS — M359 Systemic involvement of connective tissue, unspecified: Secondary | ICD-10-CM

## 2018-01-10 DIAGNOSIS — G8929 Other chronic pain: Secondary | ICD-10-CM

## 2018-01-10 DIAGNOSIS — M25561 Pain in right knee: Secondary | ICD-10-CM | POA: Diagnosis not present

## 2018-01-10 DIAGNOSIS — M170 Bilateral primary osteoarthritis of knee: Secondary | ICD-10-CM | POA: Diagnosis not present

## 2018-01-10 DIAGNOSIS — M19072 Primary osteoarthritis, left ankle and foot: Secondary | ICD-10-CM

## 2018-01-10 DIAGNOSIS — E785 Hyperlipidemia, unspecified: Secondary | ICD-10-CM

## 2018-01-10 DIAGNOSIS — R011 Cardiac murmur, unspecified: Secondary | ICD-10-CM | POA: Diagnosis not present

## 2018-01-10 DIAGNOSIS — M19041 Primary osteoarthritis, right hand: Secondary | ICD-10-CM | POA: Diagnosis not present

## 2018-01-10 DIAGNOSIS — M19071 Primary osteoarthritis, right ankle and foot: Secondary | ICD-10-CM

## 2018-01-10 DIAGNOSIS — M19042 Primary osteoarthritis, left hand: Secondary | ICD-10-CM

## 2018-01-10 DIAGNOSIS — Z8639 Personal history of other endocrine, nutritional and metabolic disease: Secondary | ICD-10-CM | POA: Diagnosis not present

## 2018-01-10 MED ORDER — LIDOCAINE HCL 1 % IJ SOLN
1.5000 mL | INTRAMUSCULAR | Status: AC | PRN
  Administered 2018-01-10: 1.5 mL

## 2018-01-10 MED ORDER — TRIAMCINOLONE ACETONIDE 40 MG/ML IJ SUSP
40.0000 mg | INTRAMUSCULAR | Status: AC | PRN
  Administered 2018-01-10: 40 mg via INTRA_ARTICULAR

## 2018-01-10 NOTE — Patient Instructions (Signed)

## 2018-01-12 LAB — CBC WITH DIFFERENTIAL/PLATELET
BASOS PCT: 1.3 %
Basophils Absolute: 72 cells/uL (ref 0–200)
Eosinophils Absolute: 556 cells/uL — ABNORMAL HIGH (ref 15–500)
Eosinophils Relative: 10.1 %
HEMATOCRIT: 39 % (ref 35.0–45.0)
HEMOGLOBIN: 13.3 g/dL (ref 11.7–15.5)
LYMPHS ABS: 1227 {cells}/uL (ref 850–3900)
MCH: 30.4 pg (ref 27.0–33.0)
MCHC: 34.1 g/dL (ref 32.0–36.0)
MCV: 89 fL (ref 80.0–100.0)
MPV: 11.5 fL (ref 7.5–12.5)
Monocytes Relative: 8 %
NEUTROS ABS: 3207 {cells}/uL (ref 1500–7800)
Neutrophils Relative %: 58.3 %
Platelets: 226 10*3/uL (ref 140–400)
RBC: 4.38 10*6/uL (ref 3.80–5.10)
RDW: 12.1 % (ref 11.0–15.0)
Total Lymphocyte: 22.3 %
WBC mixed population: 440 cells/uL (ref 200–950)
WBC: 5.5 10*3/uL (ref 3.8–10.8)

## 2018-01-12 LAB — URINALYSIS, ROUTINE W REFLEX MICROSCOPIC
BACTERIA UA: NONE SEEN /HPF
Bilirubin Urine: NEGATIVE
Glucose, UA: NEGATIVE
HYALINE CAST: NONE SEEN /LPF
Hgb urine dipstick: NEGATIVE
Ketones, ur: NEGATIVE
Nitrite: NEGATIVE
Protein, ur: NEGATIVE
RBC / HPF: NONE SEEN /HPF (ref 0–2)
SQUAMOUS EPITHELIAL / LPF: NONE SEEN /HPF (ref <=5)
Specific Gravity, Urine: 1.015 (ref 1.001–1.03)

## 2018-01-12 LAB — COMPLETE METABOLIC PANEL WITH GFR
AG RATIO: 2 (calc) (ref 1.0–2.5)
ALBUMIN MSPROF: 4.5 g/dL (ref 3.6–5.1)
ALT: 20 U/L (ref 6–29)
AST: 12 U/L (ref 10–35)
Alkaline phosphatase (APISO): 58 U/L (ref 33–130)
BUN / CREAT RATIO: 12 (calc) (ref 6–22)
BUN: 13 mg/dL (ref 7–25)
CHLORIDE: 102 mmol/L (ref 98–110)
CO2: 30 mmol/L (ref 20–32)
Calcium: 9.5 mg/dL (ref 8.6–10.4)
Creat: 1.05 mg/dL — ABNORMAL HIGH (ref 0.50–0.99)
GFR, EST AFRICAN AMERICAN: 66 mL/min/{1.73_m2} (ref >=60)
GFR, EST NON AFRICAN AMERICAN: 57 mL/min/{1.73_m2} — AB (ref >=60)
Globulin: 2.2 g/dL (calc) (ref 1.9–3.7)
Glucose, Bld: 250 mg/dL — ABNORMAL HIGH (ref 65–99)
POTASSIUM: 4.5 mmol/L (ref 3.5–5.3)
Sodium: 139 mmol/L (ref 135–146)
TOTAL PROTEIN: 6.7 g/dL (ref 6.1–8.1)
Total Bilirubin: 0.6 mg/dL (ref 0.2–1.2)

## 2018-01-12 LAB — ANTI-DNA ANTIBODY, DOUBLE-STRANDED

## 2018-01-12 LAB — SEDIMENTATION RATE: SED RATE: 9 mm/h (ref 0–30)

## 2018-01-12 LAB — C3 AND C4
C3 COMPLEMENT: 152 mg/dL (ref 83–193)
C4 COMPLEMENT: 28 mg/dL (ref 15–57)

## 2018-01-12 LAB — ANTI-NUCLEAR AB-TITER (ANA TITER)

## 2018-01-12 LAB — VITAMIN D 25 HYDROXY (VIT D DEFICIENCY, FRACTURES): Vit D, 25-Hydroxy: 39 ng/mL (ref 30–100)

## 2018-01-12 LAB — ANA: Anti Nuclear Antibody(ANA): POSITIVE — AB

## 2018-01-13 NOTE — Progress Notes (Signed)
Glucose is elevated.  Please notify patient.   ANA titer increased.  All other autoimmune labs are WNL.  All other labs are stable.

## 2018-03-10 DIAGNOSIS — E119 Type 2 diabetes mellitus without complications: Secondary | ICD-10-CM | POA: Diagnosis not present

## 2018-03-20 ENCOUNTER — Ambulatory Visit: Payer: BLUE CROSS/BLUE SHIELD | Admitting: Rheumatology

## 2018-03-29 DIAGNOSIS — E039 Hypothyroidism, unspecified: Secondary | ICD-10-CM | POA: Diagnosis not present

## 2018-03-29 DIAGNOSIS — E559 Vitamin D deficiency, unspecified: Secondary | ICD-10-CM | POA: Diagnosis not present

## 2018-03-29 DIAGNOSIS — Z1231 Encounter for screening mammogram for malignant neoplasm of breast: Secondary | ICD-10-CM | POA: Diagnosis not present

## 2018-03-29 DIAGNOSIS — Z79899 Other long term (current) drug therapy: Secondary | ICD-10-CM | POA: Diagnosis not present

## 2018-03-29 DIAGNOSIS — E1165 Type 2 diabetes mellitus with hyperglycemia: Secondary | ICD-10-CM | POA: Diagnosis not present

## 2018-03-29 DIAGNOSIS — M359 Systemic involvement of connective tissue, unspecified: Secondary | ICD-10-CM | POA: Diagnosis not present

## 2018-03-29 DIAGNOSIS — E782 Mixed hyperlipidemia: Secondary | ICD-10-CM | POA: Diagnosis not present

## 2018-03-29 DIAGNOSIS — Z0000 Encounter for general adult medical examination without abnormal findings: Secondary | ICD-10-CM | POA: Diagnosis not present

## 2018-04-06 DIAGNOSIS — E119 Type 2 diabetes mellitus without complications: Secondary | ICD-10-CM | POA: Diagnosis not present

## 2018-04-17 DIAGNOSIS — E119 Type 2 diabetes mellitus without complications: Secondary | ICD-10-CM | POA: Diagnosis not present

## 2018-04-17 NOTE — Progress Notes (Signed)
Office Visit Note  Patient: Susan Daniels             Date of Birth: 1956-04-26           MRN: [id number]             PCP: Josetta Huddle, MD Referring: Josetta Huddle, MD Visit Date: 04/28/2018 Occupation: @GUAROCC @    Subjective:  Knee pain  History of Present Illness: Susan Daniels is a 62 y.o. female history of autoimmune disease and osteoarthritis.  She states she has been tolerating Plaquenil well without any side effects.  She had cortisone injection to her right knee joint in March for osteoarthritis and had good response to that.  Currently she is not having much discomfort in her joints.  Activities of Daily Living:  Patient reports morning stiffness for 0 minutes.   Patient Denies nocturnal pain.  Difficulty dressing/grooming: Denies Difficulty climbing stairs: Reports Difficulty getting out of chair: Denies Difficulty using hands for taps, buttons, cutlery, and/or writing: Denies   Review of Systems  Constitutional: Negative for fatigue, night sweats, weight gain and weight loss.  HENT: Negative for mouth sores, trouble swallowing, trouble swallowing, mouth dryness and nose dryness.   Eyes: Negative for pain, redness, visual disturbance and dryness.  Respiratory: Negative for cough, shortness of breath and difficulty breathing.   Cardiovascular: Negative for chest pain, palpitations, hypertension, irregular heartbeat and swelling in legs/feet.  Gastrointestinal: Negative for abdominal pain, blood in stool, constipation and diarrhea.  Endocrine: Negative for increased urination.  Genitourinary: Negative for pelvic pain and vaginal dryness.  Musculoskeletal: Positive for arthralgias and joint pain. Negative for joint swelling, myalgias, muscle weakness, morning stiffness, muscle tenderness and myalgias.  Skin: Positive for hair loss. Negative for color change, rash, skin tightness, ulcers and sensitivity to sunlight.  Allergic/Immunologic: Negative for  susceptible to infections.  Neurological: Negative for dizziness, light-headedness, headaches, memory loss, night sweats and weakness.  Hematological: Negative for bruising/bleeding tendency and swollen glands.  Psychiatric/Behavioral: Negative for depressed mood, confusion and sleep disturbance. The patient is not nervous/anxious.     PMFS History:  Patient Active Problem List   Diagnosis Date Noted  . Autoimmune disease ([place]) 06/03/2017  . High risk medication use 06/03/2017  . Primary osteoarthritis of both hands 06/03/2017  . Primary osteoarthritis of both knees 06/03/2017  . Primary osteoarthritis of both feet 06/03/2017  . Heart murmur 06/03/2017  . History of hypothyroidism 06/03/2017  . Dyslipidemia 06/03/2017  . History of diabetes mellitus 06/03/2017  . Vitamin D deficiency 06/03/2017    Past Medical History:  Diagnosis Date  . Arthritis    Rheumatoid  . Diabetes mellitus without complication ([place])   . Headache(784.0)    migraines  . Heart murmur     Family History  Problem Relation Age of Onset  . Healthy Mother   . Heart disease Father   . Rheumatic fever Father   . Hypertension Sister   . Rheumatic fever Sister   . Healthy Son   . Healthy Daughter    Past Surgical History:  Procedure Laterality Date  . COLONOSCOPY WITH PROPOFOL N/A 01/30/2013   Procedure: COLONOSCOPY WITH PROPOFOL;  Surgeon: Garlan Fair, MD;  Location: [facility];  Service: Endoscopy;  Laterality: N/A;  . TUBAL LIGATION     Social History   Social History Narrative  . Not on file     Objective: Vital Signs: BP 129/81 (BP Location: Left Arm, Patient Position: Sitting, Cuff Size: Normal)  Pulse 66   Resp 15   Ht 5\' 2"  (1.575 m)   Wt 205 lb (93 kg)   BMI 37.49 kg/m    Physical Exam  Constitutional: She is oriented to person, place, and time. She appears well-developed and well-nourished.  HENT:  Head: Normocephalic and atraumatic.  Eyes: Conjunctivae and EOM are  normal.  Neck: Normal range of motion.  Cardiovascular: Normal rate, regular rhythm, normal heart sounds and intact distal pulses.  Pulmonary/Chest: Effort normal and breath sounds normal.  Abdominal: Soft. Bowel sounds are normal.  Lymphadenopathy:    She has no cervical adenopathy.  Neurological: She is alert and oriented to person, place, and time.  Skin: Skin is warm and dry. Capillary refill takes less than 2 seconds.  Psychiatric: She has a normal mood and affect. Her behavior is normal.  Nursing note and vitals reviewed.    Musculoskeletal Exam: C-spine thoracic lumbar spine good range of motion.  Shoulder joints elbow joints wrist joint MCPs PIPs DIPs were in good range of motion.  She has DIP PIP thickening in her hands and feet.  She is crepitus in her knee joints without any warmth swelling or effusion.  No synovitis was noted.  CDAI Exam: No CDAI exam completed.    Investigation: No additional findings. PLQ eye exam: 09/15/2017 CBC Latest Ref Rng & Units 01/10/2018 09/14/2017 06/27/2017  WBC 3.8 - 10.8 Thousand/uL 5.5 6.9 5.9  Hemoglobin 11.7 - 15.5 g/dL 13.3 12.9 12.9  Hematocrit 35.0 - 45.0 % 39.0 37.3 39.4  Platelets 140 - 400 Thousand/uL 226 244 244   CMP Latest Ref Rng & Units 01/10/2018 09/14/2017 06/27/2017  Glucose 65 - 99 mg/dL 250(H) 207(H) 260(H)  BUN 7 - 25 mg/dL 13 17 13   Creatinine 0.50 - 0.99 mg/dL 1.05(H) 1.23(H) 1.03(H)  Sodium 135 - 146 mmol/L 139 139 139  Potassium 3.5 - 5.3 mmol/L 4.5 4.2 4.4  Chloride 98 - 110 mmol/L 102 102 103  CO2 20 - 32 mmol/L 30 29 27   Calcium 8.6 - 10.4 mg/dL 9.5 9.1 9.3  Total Protein 6.1 - 8.1 g/dL 6.7 6.7 6.4  Total Bilirubin 0.2 - 1.2 mg/dL 0.6 0.4 0.5  Alkaline Phos 33 - 130 U/L - - 53  AST 10 - 35 U/L 12 12 12   ALT 6 - 29 U/L 20 14 17     Imaging: No results found.  Speciality Comments: PLQ eye exam: 09/15/2017 Normal. Dr. Dyke Maes. Follow up in 1 year.    Procedures:  No procedures performed Allergies:  Aspirin; Epinephrine; and Hydrocodone   Assessment / Plan:     Visit Diagnoses: Autoimmune disease ([facility]CC)-history of rash, arthralgias, positive ANA(ENA, C3-C4, anticardiolipin, beta-2, lupus anticoagulant was negative.  ANA was 1: 320 speckled), photosensitivity treated by Dr. Rockwell Alexandria and then by Dr. Trudie Reed with Plaquenil since 2006.  Patient has no clinical features of active disease.  We had detailed discussion regarding it.  I have advised her to reduce her Plaquenil to twice daily Monday to Friday only.  If she does well we can try to reduce Plaquenil further.  High risk medication use - PLQ 200 mg po bid, eye exam:03/2018 patient.  Patient will get labs today and then every 5 months.  Primary osteoarthritis of both hands she has minimal stiffness.-  Primary osteoarthritis of both knees-doing well.  She had cortisone injection to her right knee joint and March and had good response to it.  Primary osteoarthritis of both feet-proper fitting shoes were discussed.  Dyslipidemia  Heart murmur  History of diabetes mellitus  History of vitamin D deficiency- she is on vitamin D supplement. History of hypothyroidism    Orders: Orders Placed This Encounter  Procedures  . CBC with Differential/Platelet  . COMPLETE METABOLIC PANEL WITH GFR  . Urinalysis, Routine w reflex microscopic  . C3 and C4  . Anti-DNA antibody, double-stranded  . Sedimentation rate   No orders of the defined types were placed in this encounter.    Follow-Up Instructions: Return in about 5 months (around 09/28/2018) for Autoimmune disease, Osteoarthritis.   Bo Merino, MD  Note - This record has been created using Editor, commissioning.  Chart creation errors have been sought, but may not always  have been located. Such creation errors do not reflect on  the standard of medical care.

## 2018-04-28 ENCOUNTER — Encounter: Payer: Self-pay | Admitting: Rheumatology

## 2018-04-28 ENCOUNTER — Ambulatory Visit: Payer: BLUE CROSS/BLUE SHIELD | Admitting: Rheumatology

## 2018-04-28 VITALS — BP 129/81 | HR 66 | Resp 15 | Ht 62.0 in | Wt 205.0 lb

## 2018-04-28 DIAGNOSIS — R011 Cardiac murmur, unspecified: Secondary | ICD-10-CM | POA: Diagnosis not present

## 2018-04-28 DIAGNOSIS — Z79899 Other long term (current) drug therapy: Secondary | ICD-10-CM | POA: Diagnosis not present

## 2018-04-28 DIAGNOSIS — M359 Systemic involvement of connective tissue, unspecified: Secondary | ICD-10-CM

## 2018-04-28 DIAGNOSIS — M170 Bilateral primary osteoarthritis of knee: Secondary | ICD-10-CM

## 2018-04-28 DIAGNOSIS — M19071 Primary osteoarthritis, right ankle and foot: Secondary | ICD-10-CM

## 2018-04-28 DIAGNOSIS — Z8639 Personal history of other endocrine, nutritional and metabolic disease: Secondary | ICD-10-CM

## 2018-04-28 DIAGNOSIS — E785 Hyperlipidemia, unspecified: Secondary | ICD-10-CM | POA: Diagnosis not present

## 2018-04-28 DIAGNOSIS — D8989 Other specified disorders involving the immune mechanism, not elsewhere classified: Secondary | ICD-10-CM | POA: Diagnosis not present

## 2018-04-28 DIAGNOSIS — M19041 Primary osteoarthritis, right hand: Secondary | ICD-10-CM

## 2018-04-28 DIAGNOSIS — M19072 Primary osteoarthritis, left ankle and foot: Secondary | ICD-10-CM

## 2018-04-28 DIAGNOSIS — M19042 Primary osteoarthritis, left hand: Secondary | ICD-10-CM

## 2018-05-01 LAB — CBC WITH DIFFERENTIAL/PLATELET
BASOS PCT: 1.2 %
Basophils Absolute: 78 cells/uL (ref 0–200)
EOS ABS: 338 {cells}/uL (ref 15–500)
Eosinophils Relative: 5.2 %
HCT: 39.5 % (ref 35.0–45.0)
HEMOGLOBIN: 13.6 g/dL (ref 11.7–15.5)
Lymphs Abs: 1482 cells/uL (ref 850–3900)
MCH: 30.4 pg (ref 27.0–33.0)
MCHC: 34.4 g/dL (ref 32.0–36.0)
MCV: 88.2 fL (ref 80.0–100.0)
MPV: 11.9 fL (ref 7.5–12.5)
Monocytes Relative: 8.5 %
NEUTROS ABS: 4050 {cells}/uL (ref 1500–7800)
Neutrophils Relative %: 62.3 %
PLATELETS: 247 10*3/uL (ref 140–400)
RBC: 4.48 10*6/uL (ref 3.80–5.10)
RDW: 12.4 % (ref 11.0–15.0)
TOTAL LYMPHOCYTE: 22.8 %
WBC: 6.5 10*3/uL (ref 3.8–10.8)
WBCMIX: 553 {cells}/uL (ref 200–950)

## 2018-05-01 LAB — COMPLETE METABOLIC PANEL WITH GFR
AG RATIO: 2.1 (calc) (ref 1.0–2.5)
ALT: 24 U/L (ref 6–29)
AST: 16 U/L (ref 10–35)
Albumin: 4.5 g/dL (ref 3.6–5.1)
Alkaline phosphatase (APISO): 50 U/L (ref 33–130)
BUN: 14 mg/dL (ref 7–25)
CALCIUM: 9.5 mg/dL (ref 8.6–10.4)
CO2: 29 mmol/L (ref 20–32)
Chloride: 104 mmol/L (ref 98–110)
Creat: 0.91 mg/dL (ref 0.50–0.99)
GFR, EST AFRICAN AMERICAN: 78 mL/min/{1.73_m2} (ref >=60)
GFR, EST NON AFRICAN AMERICAN: 68 mL/min/{1.73_m2} (ref >=60)
GLOBULIN: 2.1 g/dL (ref 1.9–3.7)
Glucose, Bld: 132 mg/dL — ABNORMAL HIGH (ref 65–99)
POTASSIUM: 4 mmol/L (ref 3.5–5.3)
Sodium: 140 mmol/L (ref 135–146)
Total Bilirubin: 0.5 mg/dL (ref 0.2–1.2)
Total Protein: 6.6 g/dL (ref 6.1–8.1)

## 2018-05-01 LAB — URINALYSIS, ROUTINE W REFLEX MICROSCOPIC
Bilirubin Urine: NEGATIVE
GLUCOSE, UA: NEGATIVE
HGB URINE DIPSTICK: NEGATIVE
Ketones, ur: NEGATIVE
Leukocytes, UA: NEGATIVE
NITRITE: NEGATIVE
Protein, ur: NEGATIVE
Specific Gravity, Urine: 1.015 (ref 1.001–1.03)

## 2018-05-01 LAB — ANTI-DNA ANTIBODY, DOUBLE-STRANDED: ds DNA Ab: <1 IU/mL

## 2018-05-01 LAB — C3 AND C4
C3 Complement: 149 mg/dL (ref 83–193)
C4 Complement: 31 mg/dL (ref 15–57)

## 2018-05-01 LAB — SEDIMENTATION RATE: SED RATE: 6 mm/h (ref 0–30)

## 2018-06-02 DIAGNOSIS — Z01419 Encounter for gynecological examination (general) (routine) without abnormal findings: Secondary | ICD-10-CM | POA: Diagnosis not present

## 2018-06-02 DIAGNOSIS — Z6837 Body mass index (BMI) 37.0-37.9, adult: Secondary | ICD-10-CM | POA: Diagnosis not present

## 2018-06-02 DIAGNOSIS — Z124 Encounter for screening for malignant neoplasm of cervix: Secondary | ICD-10-CM | POA: Diagnosis not present

## 2018-06-30 DIAGNOSIS — E119 Type 2 diabetes mellitus without complications: Secondary | ICD-10-CM | POA: Diagnosis not present

## 2018-06-30 DIAGNOSIS — E1165 Type 2 diabetes mellitus with hyperglycemia: Secondary | ICD-10-CM | POA: Diagnosis not present

## 2018-09-15 NOTE — Progress Notes (Signed)
Office Visit Note  Patient: Susan Daniels             Date of Birth: 08-29-1956           MRN: [id number]             PCP: Josetta Huddle, MD Referring: Josetta Huddle, MD Visit Date: 09/29/2018 Occupation: @GUAROCC @  Subjective:  Right knee pain   History of Present Illness: Susan Daniels is a 62 y.o. female history of autoimmune disease and osteoarthritis.  She has been having increased pain and discomfort in her right knee joint.  The right knee joint feels swollen.  She has discomfort in her bilateral CMC joints off and on.  She denies any discomfort in her feet currently.  She denies any history of any rash.  Activities of Daily Living:  Patient reports morning stiffness for 0 minutes.   Patient Reports nocturnal pain.  Difficulty dressing/grooming: Denies Difficulty climbing stairs: Reports Difficulty getting out of chair: Reports Difficulty using hands for taps, buttons, cutlery, and/or writing: Denies  Review of Systems  Constitutional: Negative for fatigue.  HENT: Negative for mouth sores, mouth dryness and nose dryness.   Eyes: Negative for pain, visual disturbance and dryness.  Respiratory: Negative for cough, hemoptysis, shortness of breath and difficulty breathing.   Cardiovascular: Negative for chest pain, palpitations, hypertension and swelling in legs/feet.  Gastrointestinal: Negative for abdominal pain, blood in stool, constipation and diarrhea.  Endocrine: Negative for increased urination.  Genitourinary: Negative for painful urination, nocturia and pelvic pain.  Musculoskeletal: Positive for arthralgias, gait problem, joint pain, joint swelling and morning stiffness. Negative for myalgias, muscle weakness, muscle tenderness and myalgias.  Skin: Negative for color change, pallor, rash, hair loss, nodules/bumps, skin tightness, ulcers and sensitivity to sunlight.  Allergic/Immunologic: Negative for susceptible to infections.  Neurological: Negative for  dizziness, numbness, headaches and weakness.  Hematological: Negative for swollen glands.  Psychiatric/Behavioral: Negative for depressed mood and sleep disturbance. The patient is not nervous/anxious.     PMFS History:  Patient Active Problem List   Diagnosis Date Noted  . Autoimmune disease ([place]) 06/03/2017  . High risk medication use 06/03/2017  . Primary osteoarthritis of both hands 06/03/2017  . Primary osteoarthritis of both knees 06/03/2017  . Primary osteoarthritis of both feet 06/03/2017  . Heart murmur 06/03/2017  . History of hypothyroidism 06/03/2017  . Dyslipidemia 06/03/2017  . History of diabetes mellitus 06/03/2017  . Vitamin D deficiency 06/03/2017    Past Medical History:  Diagnosis Date  . Arthritis    Rheumatoid  . Diabetes mellitus without complication ([place])   . Headache(784.0)    migraines  . Heart murmur     Family History  Problem Relation Age of Onset  . Healthy Mother   . Heart disease Father   . Rheumatic fever Father   . Hypertension Sister   . Rheumatic fever Sister   . Healthy Son   . Healthy Daughter    Past Surgical History:  Procedure Laterality Date  . COLONOSCOPY WITH PROPOFOL N/A 01/30/2013   Procedure: COLONOSCOPY WITH PROPOFOL;  Surgeon: Garlan Fair, MD;  Location: [facility];  Service: Endoscopy;  Laterality: N/A;  . dental implant  09/29/2018  . TUBAL LIGATION     Social History   Social History Narrative  . Not on file    Objective: Vital Signs: BP (!) 143/94 (BP Location: Right Arm, Patient Position: Sitting, Cuff Size: Normal)   Pulse 70   Resp 14  Ht 5\' 1"  (1.549 m)   Wt 204 lb 12.8 oz (92.9 kg)   BMI 38.70 kg/m    Physical Exam  Constitutional: She is oriented to person, place, and time. She appears well-developed and well-nourished.  HENT:  Head: Normocephalic and atraumatic.  Eyes: Conjunctivae and EOM are normal.  Neck: Normal range of motion.  Cardiovascular: Normal rate, regular rhythm, normal  heart sounds and intact distal pulses.  Pulmonary/Chest: Effort normal and breath sounds normal.  Abdominal: Soft. Bowel sounds are normal.  Lymphadenopathy:    She has no cervical adenopathy.  Neurological: She is alert and oriented to person, place, and time.  Skin: Skin is warm and dry. Capillary refill takes less than 2 seconds.  Psychiatric: She has a normal mood and affect. Her behavior is normal.  Nursing note and vitals reviewed.    Musculoskeletal Exam: C-spine thoracic lumbar spine good range of motion.  Shoulder joints elbow joints wrist joints were in good range of motion.  She has DIP and PIP thickening with no synovitis.  Hip joints were in good range of motion.  Knee joints were in good range of motion.  She has discomfort range of motion of her right knee joint and crepitus.  MTPs and PIPs were in good range of motion with no synovitis.  CDAI Exam: CDAI Score: Not documented Patient Global Assessment: Not documented; Provider Global Assessment: Not documented Swollen: Not documented; Tender: Not documented Joint Exam   Not documented   There is currently no information documented on the homunculus. Go to the Rheumatology activity and complete the homunculus joint exam.  Investigation: No additional findings.  Imaging: Xr Knee 3 View Right  Result Date: 09/29/2018 Severe medial compartment narrowing was noted.  Medial and lateral osteophytes were noted.  Severe patellofemoral narrowing was noted. Impression: These findings are consistent with severe osteoarthritis and severe chondromalacia patella.   Recent Labs: Lab Results  Component Value Date   WBC 6.5 04/28/2018   HGB 13.6 04/28/2018   PLT 247 04/28/2018   NA 140 04/28/2018   K 4.0 04/28/2018   CL 104 04/28/2018   CO2 29 04/28/2018   GLUCOSE 132 (H) 04/28/2018   BUN 14 04/28/2018   CREATININE 0.91 04/28/2018   BILITOT 0.5 04/28/2018   ALKPHOS 53 06/27/2017   AST 16 04/28/2018   ALT 24 04/28/2018    PROT 6.6 04/28/2018   ALBUMIN 4.2 06/27/2017   CALCIUM 9.5 04/28/2018   GFRAA 78 04/28/2018   Component     Latest Ref Rng & Units 04/28/2018  C3 Complement     83 - 193 mg/dL 149  C4 Complement     15 - 57 mg/dL 31  ds DNA Ab     IU/mL <1  Sed Rate     0 - 30 mm/h 6    Speciality Comments: PLQ eye exam: 09/15/2017 Normal. Dr. Dyke Maes. Follow up in 1 year.  Procedures:  No procedures performed Allergies: Aspirin; Epinephrine; and Hydrocodone   Assessment / Plan:     Visit Diagnoses: Autoimmune disease ([place]) - history of rash, arthralgias, positive ANA(ENA, C3-C4, anticardiolipin, beta-2, lupus anticoagulant was negative.  ANA was 1: 320 speckled), photosensitivity.  Patient denies any rash or photosensitivity recently.  She has been experiencing increased pain and discomfort in her right knee joint.  High risk medication use - Current regimen includes Plaquenil 200 mg twice daily Monday through Friday.  Last PLQ eye exam normal on 09/15/17.  Most recent CBC/CMP and autoimmune  labs within normal limits on 04/28/18. Due for CBC/CMP today and then every 5 months. Standing orders are in place.  Recommend flu, Pneumovax 23, Prevnar 13, and Shingrix as indicated. per patient treated by Dr. Rockwell [place] and then Dr. Trudie Reed in 2006 - Plan: CBC with Differential/Platelet, COMPLETE METABOLIC PANEL WITH GFR.  Have advised patient to get eye exam as soon as possible.  Primary osteoarthritis of both hands-joint protection muscle strengthening was discussed.  Chronic pain of right knee-she has been having increased pain and discomfort in her right knee joint.  I will obtain x-ray of right knee joint.  The x-ray revealed severe medial compartment narrowing and patellofemoral narrowing.  I discussed total knee replacement but patient declined.  She is interested in getting cortisone injection.  Topical anti-inflammatories were discussed.  I would avoid cortisone injection which she is requesting due to  her being diabetic.  I would apply for Visco supplement injections.  Primary osteoarthritis of both knees  Primary osteoarthritis of both feet-joint protection and proper fitting shoes were discussed.  Other medical problems are listed as follows:  Dyslipidemia  Heart murmur  History of diabetes mellitus  History of vitamin D deficiency  History of hypothyroidism   Orders: Orders Placed This Encounter  Procedures  . XR KNEE 3 VIEW RIGHT  . CBC with Differential/Platelet  . COMPLETE METABOLIC PANEL WITH GFR  . Urinalysis, Routine w reflex microscopic  . Anti-DNA antibody, double-stranded  . C3 and C4  . Sedimentation rate   No orders of the defined types were placed in this encounter.    Follow-Up Instructions: Return in about 5 months (around 02/28/2019) for Autoimmune Disease, Osteoarthritis.   Bo Merino, MD  Note - This record has been created using Editor, commissioning.  Chart creation errors have been sought, but may not always  have been located. Such creation errors do not reflect on  the standard of medical care.

## 2018-09-29 ENCOUNTER — Encounter: Payer: Self-pay | Admitting: Rheumatology

## 2018-09-29 ENCOUNTER — Telehealth: Payer: Self-pay

## 2018-09-29 ENCOUNTER — Ambulatory Visit: Payer: BLUE CROSS/BLUE SHIELD | Admitting: Rheumatology

## 2018-09-29 ENCOUNTER — Ambulatory Visit (INDEPENDENT_AMBULATORY_CARE_PROVIDER_SITE_OTHER): Payer: Self-pay

## 2018-09-29 VITALS — BP 143/94 | HR 70 | Resp 14 | Ht 61.0 in | Wt 204.8 lb

## 2018-09-29 DIAGNOSIS — M359 Systemic involvement of connective tissue, unspecified: Secondary | ICD-10-CM | POA: Diagnosis not present

## 2018-09-29 DIAGNOSIS — M19041 Primary osteoarthritis, right hand: Secondary | ICD-10-CM

## 2018-09-29 DIAGNOSIS — M25561 Pain in right knee: Secondary | ICD-10-CM | POA: Diagnosis not present

## 2018-09-29 DIAGNOSIS — M19071 Primary osteoarthritis, right ankle and foot: Secondary | ICD-10-CM

## 2018-09-29 DIAGNOSIS — E785 Hyperlipidemia, unspecified: Secondary | ICD-10-CM

## 2018-09-29 DIAGNOSIS — Z79899 Other long term (current) drug therapy: Secondary | ICD-10-CM

## 2018-09-29 DIAGNOSIS — M170 Bilateral primary osteoarthritis of knee: Secondary | ICD-10-CM

## 2018-09-29 DIAGNOSIS — G8929 Other chronic pain: Secondary | ICD-10-CM

## 2018-09-29 DIAGNOSIS — M19042 Primary osteoarthritis, left hand: Secondary | ICD-10-CM

## 2018-09-29 DIAGNOSIS — R011 Cardiac murmur, unspecified: Secondary | ICD-10-CM

## 2018-09-29 DIAGNOSIS — M19072 Primary osteoarthritis, left ankle and foot: Secondary | ICD-10-CM

## 2018-09-29 DIAGNOSIS — Z8639 Personal history of other endocrine, nutritional and metabolic disease: Secondary | ICD-10-CM

## 2018-09-29 HISTORY — PX: OTHER SURGICAL HISTORY: SHX169

## 2018-09-29 MED ORDER — DICLOFENAC SODIUM 1 % TD GEL: TRANSDERMAL | 3 refills | Status: DC

## 2018-09-29 NOTE — Addendum Note (Signed)
Addended by: Bo Merino on: 09/29/2018 12:34 PM   Modules accepted: Orders

## 2018-09-29 NOTE — Telephone Encounter (Signed)
Please apply for right knee visco, per Dr. Deveshwar. Thanks! 

## 2018-10-02 LAB — CBC WITH DIFFERENTIAL/PLATELET
BASOS ABS: 72 {cells}/uL (ref 0–200)
Basophils Relative: 1.2 %
EOS ABS: 198 {cells}/uL (ref 15–500)
Eosinophils Relative: 3.3 %
HEMATOCRIT: 38.9 % (ref 35.0–45.0)
Hemoglobin: 13.5 g/dL (ref 11.7–15.5)
LYMPHS ABS: 1392 {cells}/uL (ref 850–3900)
MCH: 30.5 pg (ref 27.0–33.0)
MCHC: 34.7 g/dL (ref 32.0–36.0)
MCV: 87.8 fL (ref 80.0–100.0)
MPV: 12.1 fL (ref 7.5–12.5)
Monocytes Relative: 8.3 %
NEUTROS PCT: 64 %
Neutro Abs: 3840 cells/uL (ref 1500–7800)
Platelets: 257 10*3/uL (ref 140–400)
RBC: 4.43 10*6/uL (ref 3.80–5.10)
RDW: 12.2 % (ref 11.0–15.0)
Total Lymphocyte: 23.2 %
WBC mixed population: 498 cells/uL (ref 200–950)
WBC: 6 10*3/uL (ref 3.8–10.8)

## 2018-10-02 LAB — COMPLETE METABOLIC PANEL WITH GFR
AG RATIO: 1.8 (calc) (ref 1.0–2.5)
ALBUMIN MSPROF: 4.4 g/dL (ref 3.6–5.1)
ALKALINE PHOSPHATASE (APISO): 53 U/L (ref 33–130)
ALT: 17 U/L (ref 6–29)
AST: 13 U/L (ref 10–35)
BILIRUBIN TOTAL: 0.5 mg/dL (ref 0.2–1.2)
BUN: 15 mg/dL (ref 7–25)
CHLORIDE: 103 mmol/L (ref 98–110)
CO2: 30 mmol/L (ref 20–32)
Calcium: 9.4 mg/dL (ref 8.6–10.4)
Creat: 0.99 mg/dL (ref 0.50–0.99)
GFR, Est African American: 71 mL/min/{1.73_m2} (ref >=60)
GFR, Est Non African American: 61 mL/min/{1.73_m2} (ref >=60)
GLOBULIN: 2.5 g/dL (ref 1.9–3.7)
Glucose, Bld: 136 mg/dL — ABNORMAL HIGH (ref 65–99)
POTASSIUM: 4.3 mmol/L (ref 3.5–5.3)
SODIUM: 140 mmol/L (ref 135–146)
Total Protein: 6.9 g/dL (ref 6.1–8.1)

## 2018-10-02 LAB — URINALYSIS, ROUTINE W REFLEX MICROSCOPIC
BACTERIA UA: NONE SEEN /HPF
Bilirubin Urine: NEGATIVE
Hgb urine dipstick: NEGATIVE
Hyaline Cast: NONE SEEN /LPF
Ketones, ur: NEGATIVE
NITRITE: NEGATIVE
Protein, ur: NEGATIVE
RBC / HPF: NONE SEEN /HPF (ref 0–2)
SPECIFIC GRAVITY, URINE: 1.02 (ref 1.001–1.03)

## 2018-10-02 LAB — ANTI-DNA ANTIBODY, DOUBLE-STRANDED: ds DNA Ab: <1 IU/mL

## 2018-10-02 LAB — C3 AND C4
C3 COMPLEMENT: 149 mg/dL (ref 83–193)
C4 COMPLEMENT: 31 mg/dL (ref 15–57)

## 2018-10-02 LAB — SEDIMENTATION RATE: SED RATE: 9 mm/h (ref 0–30)

## 2018-10-02 NOTE — Telephone Encounter (Signed)
Noted  

## 2018-10-03 ENCOUNTER — Telehealth: Payer: Self-pay | Admitting: *Deleted

## 2018-10-03 ENCOUNTER — Telehealth (INDEPENDENT_AMBULATORY_CARE_PROVIDER_SITE_OTHER): Payer: Self-pay

## 2018-10-03 NOTE — Telephone Encounter (Signed)
Prior Authorization submitted via cover my meds for Voltaren Gel. Will update once response is received.

## 2018-10-03 NOTE — Telephone Encounter (Signed)
Submitted VOB for Synvisc series, right knee. 

## 2018-10-03 NOTE — Progress Notes (Signed)
Labs are stable.  Glucose is mildly elevated.  Probably not fasting.

## 2018-10-04 NOTE — Telephone Encounter (Signed)
Prior Authorization for Voltaren Gel has been denied. Patient advised she can use [email or web address]. Patient verbalized understanding.

## 2018-10-18 ENCOUNTER — Telehealth (INDEPENDENT_AMBULATORY_CARE_PROVIDER_SITE_OTHER): Payer: Self-pay

## 2018-10-18 NOTE — Telephone Encounter (Signed)
Please schedule patient an appointment with Dr. Estanislado Pandy or Lovena Le for gel injection.  Thank You.  Patient is approved for Synvisc series, right knee. Buy & Bill Covered at 100% through her insurance No PA required

## 2018-10-20 NOTE — Telephone Encounter (Signed)
LMOM for patient to call and schedule Synvisc injections for your right knee.

## 2018-11-27 ENCOUNTER — Ambulatory Visit (INDEPENDENT_AMBULATORY_CARE_PROVIDER_SITE_OTHER): Payer: BLUE CROSS/BLUE SHIELD | Admitting: Physician Assistant

## 2018-11-27 DIAGNOSIS — M1711 Unilateral primary osteoarthritis, right knee: Secondary | ICD-10-CM | POA: Diagnosis not present

## 2018-11-27 MED ORDER — HYLAN G-F 20 16 MG/2ML IX SOSY
16.0000 mg | PREFILLED_SYRINGE | INTRA_ARTICULAR | Status: AC | PRN
  Administered 2018-11-27: 16 mg via INTRA_ARTICULAR

## 2018-11-27 MED ORDER — LIDOCAINE HCL 1 % IJ SOLN
1.5000 mL | INTRAMUSCULAR | Status: AC | PRN
  Administered 2018-11-27: 1.5 mL

## 2018-11-27 NOTE — Progress Notes (Signed)
   Procedure Note  Patient: Susan Daniels             Date of Birth: 05-Apr-1956           MRN: [id number]             Visit Date: 11/27/2018  Procedures: Visit Diagnoses: Primary osteoarthritis of right knee - Plan: Large Joint Inj: R knee  Synvisc #1 Right knee joint injection B/B  Large Joint Inj: R knee on 11/27/2018 9:37 AM Indications: pain Details: 25 G 1.5 in needle, medial approach  Arthrogram: No  Medications: 16 mg Hylan 16 MG/2ML; 1.5 mL lidocaine 1 % Aspirate: 0 mL Outcome: tolerated well, no immediate complications Procedure, treatment alternatives, risks and benefits explained, specific risks discussed. Consent was given by the patient. Immediately prior to procedure a time out was called to verify the correct patient, procedure, equipment, support staff and site/side marked as required. Patient was prepped and draped in the usual sterile fashion.     Patient tolerated the procedure well.  Hazel Sams, PA-C

## 2018-12-04 ENCOUNTER — Ambulatory Visit (INDEPENDENT_AMBULATORY_CARE_PROVIDER_SITE_OTHER): Payer: BLUE CROSS/BLUE SHIELD | Admitting: Physician Assistant

## 2018-12-04 ENCOUNTER — Encounter: Payer: Self-pay | Admitting: Physician Assistant

## 2018-12-04 DIAGNOSIS — M1711 Unilateral primary osteoarthritis, right knee: Secondary | ICD-10-CM | POA: Diagnosis not present

## 2018-12-04 MED ORDER — LIDOCAINE HCL 1 % IJ SOLN
1.5000 mL | INTRAMUSCULAR | Status: AC | PRN
  Administered 2018-12-04: 1.5 mL

## 2018-12-04 MED ORDER — HYLAN G-F 20 16 MG/2ML IX SOSY
16.0000 mg | PREFILLED_SYRINGE | INTRA_ARTICULAR | Status: AC | PRN
  Administered 2018-12-04: 16 mg via INTRA_ARTICULAR

## 2018-12-04 NOTE — Progress Notes (Signed)
   Procedure Note  Patient: Susan Daniels             Date of Birth: June 20, 1956           MRN: [id number]             Visit Date: 12/04/2018  Procedures: Visit Diagnoses: Primary osteoarthritis of right knee - Plan: Large Joint Inj: R knee Synvisc #2 right knee B/B Large Joint Inj: R knee on 12/04/2018 10:44 AM Indications: pain Details: 25 G 1.5 in needle, medial approach  Arthrogram: No  Medications: 16 mg Hylan 16 MG/2ML; 1.5 mL lidocaine 1 % Aspirate: 0 mL Outcome: tolerated well, no immediate complications Procedure, treatment alternatives, risks and benefits explained, specific risks discussed. Consent was given by the patient. Immediately prior to procedure a time out was called to verify the correct patient, procedure, equipment, support staff and site/side marked as required. Patient was prepped and draped in the usual sterile fashion.      Patient tolerated the procedure well.  Hazel Sams, PA-C

## 2018-12-11 ENCOUNTER — Ambulatory Visit (INDEPENDENT_AMBULATORY_CARE_PROVIDER_SITE_OTHER): Payer: BLUE CROSS/BLUE SHIELD | Admitting: Physician Assistant

## 2018-12-11 ENCOUNTER — Ambulatory Visit: Payer: BLUE CROSS/BLUE SHIELD | Admitting: Physician Assistant

## 2018-12-11 DIAGNOSIS — M1711 Unilateral primary osteoarthritis, right knee: Secondary | ICD-10-CM | POA: Diagnosis not present

## 2018-12-11 MED ORDER — HYLAN G-F 20 16 MG/2ML IX SOSY
16.0000 mg | PREFILLED_SYRINGE | INTRA_ARTICULAR | Status: AC | PRN
  Administered 2018-12-11: 16 mg via INTRA_ARTICULAR

## 2018-12-11 MED ORDER — LIDOCAINE HCL 1 % IJ SOLN
1.5000 mL | INTRAMUSCULAR | Status: AC | PRN
  Administered 2018-12-11: 1.5 mL

## 2018-12-11 NOTE — Progress Notes (Signed)
   Procedure Note  Patient: Susan Daniels             Date of Birth: 25-Sep-1956           MRN: [id number]             Visit Date: 12/11/2018  Procedures: Visit Diagnoses: Primary osteoarthritis of right knee synvisc #3 right knee B/B Large Joint Inj: R knee on 12/11/2018 8:39 AM Indications: pain Details: 25 G 1.5 in needle, medial approach  Arthrogram: No  Medications: 16 mg Hylan 16 MG/2ML; 1.5 mL lidocaine 1 % Aspirate: 0 mL Outcome: tolerated well, no immediate complications Procedure, treatment alternatives, risks and benefits explained, specific risks discussed. Consent was given by the patient. Immediately prior to procedure a time out was called to verify the correct patient, procedure, equipment, support staff and site/side marked as required. Patient was prepped and draped in the usual sterile fashion.     Patient tolerated the procedure well.  Hazel Sams, PA-C

## 2019-02-14 NOTE — Progress Notes (Signed)
Virtual Visit via Video Note  I connected with Susan Daniels on 02/14/19 at 10:15 AM EDT by a video enabled telemedicine application and verified that I am speaking with the correct person using two identifiers.   I discussed the limitations of evaluation and management by telemedicine and the availability of in person appointments. The patient expressed understanding and agreed to proceed.  CC: Occasional knee joint pain   History of Present Illness: Patient is a 63 year old female with a past medical history of autoimmune disease and osteoarthritis.  She is taking PLQ 200 mg BID M-F. She denies any recent flares.  She denies any sores in mouth or nose.  She denies any sicca symptoms.  She has not had any recent rashes.  She denies any symptoms of Raynaud's. She had visco injections in the right knee joint in January 2020 that has provided good pain relief.  She has some difficulty going down steps.  She has no difficulty getting up from a chair. She denies any other joint pain or joint swelling.   She denies any palpitations or shortness of breath.   Review of Systems  Constitutional: Negative for fever and malaise/fatigue.  HENT:       Denies oral or nasal ulcerations  Denies mouth dryness  Eyes: Negative for photophobia, pain, discharge and redness.       Denies eye dryness  Respiratory: Negative for cough, shortness of breath and wheezing.   Cardiovascular: Negative for chest pain and palpitations.  Gastrointestinal: Negative for blood in stool, constipation and diarrhea.  Genitourinary: Negative for dysuria.  Musculoskeletal: Positive for joint pain. Negative for back pain, myalgias and neck pain.  Skin: Negative for rash.       Denies Raynaud's  Neurological: Negative for dizziness and headaches.  Psychiatric/Behavioral: Negative for depression. The patient has insomnia. The patient is not nervous/anxious.    Observations/Objective:  Physical Exam  Constitutional: She is  oriented to person, place, and time and well-developed, well-nourished, and in no distress.  HENT:  Head: Normocephalic and atraumatic.  Eyes: Conjunctivae are normal.  Pulmonary/Chest: Effort normal.  Neurological: She is alert and oriented to person, place, and time.  Psychiatric: Mood, memory, affect and judgment normal.   Patient reports morning stiffness for 10  minutes.   Patient denies nocturnal pain.  Difficulty dressing/grooming: Denies Difficulty climbing stairs: Reports Difficulty getting out of chair: Denies Difficulty using hands for taps, buttons, cutlery, and/or writing: Denies   Assessment and Plan: Visit Diagnoses: Autoimmune disease ([place]) - history of rash, arthralgias, positive ANA(ENA, C3-C4, anticardiolipin, beta-2, lupus anticoagulant was negative.  ANA was 1: 320 speckled), photosensitivity: She has not had any recent flares. She is clinically doing well on Plaquenil 200 mg 1 tablet BID M-F. She has no oral or nasal ulcerations, hair loss, photosensitivity, Raynaud's, palpitations, shortness of breath, joint pain or joint swelling. Lab results from 09/29/18 were reviewed including: Sed rate WNL, complements WNL, and dsDNA negative. Future orders were placed today.  She will continue on the current treatment regimen.  She does not need any refills at this time. She was advised to notify us if she develops any new or worsening symptoms.  She will follow up in 3-4 months.    High risk medication use - Current regimen includes Plaquenil 200 mg twice daily Monday through Friday.  Last PLQ eye exam normal on 09/15/17.  She has an eye exam scheduled in May 2020. CBC and CMP were WNL on 09/29/18.  She  is due to update CBC and CMP.  Future orders will be placed today.   Primary osteoarthritis of both hands-She has no hand pain or joint swelling at this time. Joint protection and muscle strengthening were discussed.   Primary osteoarthritis of both knees-She has noticed  improvement since having the visco gel injections in January 2020.  She has no joint pain or joint swelling.  She has no joint stiffness.  She has been able to be more mobile. She has some difficulty going down steps. She has not needed to use topical agent.  We discussed the importance of joint protection and muscle strengthening.  We also discussed weight loss.   Primary osteoarthritis of both feet-She has no feet pain or joint swelling at this time.     Follow Up Instructions: She will follow up in 3-4 months  Future orders were placed today.    I discussed the assessment and treatment plan with the patient. The patient was provided an opportunity to ask questions and all were answered. The patient agreed with the plan and demonstrated an understanding of the instructions.   The patient was advised to call back or seek an in-person evaluation if the symptoms worsen or if the condition fails to improve as anticipated.  I provided 22 minutes of non-face-to-face time during this encounter.  Bo Merino, MD  Scribed by-  Hazel Sams, PA-C

## 2019-02-23 ENCOUNTER — Telehealth (INDEPENDENT_AMBULATORY_CARE_PROVIDER_SITE_OTHER): Payer: BLUE CROSS/BLUE SHIELD | Admitting: Rheumatology

## 2019-02-23 ENCOUNTER — Encounter: Payer: Self-pay | Admitting: Rheumatology

## 2019-02-23 ENCOUNTER — Telehealth: Payer: Self-pay | Admitting: Rheumatology

## 2019-02-23 DIAGNOSIS — M170 Bilateral primary osteoarthritis of knee: Secondary | ICD-10-CM

## 2019-02-23 DIAGNOSIS — M19071 Primary osteoarthritis, right ankle and foot: Secondary | ICD-10-CM

## 2019-02-23 DIAGNOSIS — M19042 Primary osteoarthritis, left hand: Secondary | ICD-10-CM | POA: Diagnosis not present

## 2019-02-23 DIAGNOSIS — Z79899 Other long term (current) drug therapy: Secondary | ICD-10-CM

## 2019-02-23 DIAGNOSIS — M359 Systemic involvement of connective tissue, unspecified: Secondary | ICD-10-CM

## 2019-02-23 DIAGNOSIS — E785 Hyperlipidemia, unspecified: Secondary | ICD-10-CM

## 2019-02-23 DIAGNOSIS — M19072 Primary osteoarthritis, left ankle and foot: Secondary | ICD-10-CM

## 2019-02-23 DIAGNOSIS — M19041 Primary osteoarthritis, right hand: Secondary | ICD-10-CM

## 2019-02-23 DIAGNOSIS — R011 Cardiac murmur, unspecified: Secondary | ICD-10-CM

## 2019-02-23 DIAGNOSIS — Z8639 Personal history of other endocrine, nutritional and metabolic disease: Secondary | ICD-10-CM

## 2019-02-23 NOTE — Telephone Encounter (Signed)
-----   Message from [name], LPN sent at 9/73/3125 11:55 AM EDT ----- Please schedule patient for a follow up visit in 3-4 months. Patient was seen for a telemedicine visit today 02/23/19. Thanks!

## 2019-02-23 NOTE — Telephone Encounter (Signed)
I LMOM for patient to call back, and schedule next follow up appt in 3-4 months.

## 2019-04-13 DIAGNOSIS — E119 Type 2 diabetes mellitus without complications: Secondary | ICD-10-CM | POA: Diagnosis not present

## 2019-05-15 ENCOUNTER — Emergency Department (HOSPITAL_COMMUNITY)
Admission: EM | Admit: 2019-05-15 | Discharge: 2019-05-15 | Disposition: A | Discharge status: Home / Self Care | Payer: BC Managed Care – PPO | Attending: Emergency Medicine | Admitting: Emergency Medicine

## 2019-05-15 ENCOUNTER — Other Ambulatory Visit: Payer: Self-pay

## 2019-05-15 DIAGNOSIS — R509 Fever, unspecified: Secondary | ICD-10-CM | POA: Insufficient documentation

## 2019-05-15 DIAGNOSIS — Z20828 Contact with and (suspected) exposure to other viral communicable diseases: Secondary | ICD-10-CM | POA: Diagnosis not present

## 2019-05-15 DIAGNOSIS — Z87891 Personal history of nicotine dependence: Secondary | ICD-10-CM | POA: Diagnosis not present

## 2019-05-15 DIAGNOSIS — Z79899 Other long term (current) drug therapy: Secondary | ICD-10-CM | POA: Insufficient documentation

## 2019-05-15 DIAGNOSIS — Z7984 Long term (current) use of oral hypoglycemic drugs: Secondary | ICD-10-CM | POA: Diagnosis not present

## 2019-05-15 DIAGNOSIS — R51 Headache: Secondary | ICD-10-CM | POA: Insufficient documentation

## 2019-05-15 DIAGNOSIS — Z20822 Contact with and (suspected) exposure to covid-19: Secondary | ICD-10-CM

## 2019-05-15 DIAGNOSIS — E119 Type 2 diabetes mellitus without complications: Secondary | ICD-10-CM | POA: Diagnosis not present

## 2019-05-15 DIAGNOSIS — M069 Rheumatoid arthritis, unspecified: Secondary | ICD-10-CM | POA: Insufficient documentation

## 2019-05-15 NOTE — ED Provider Notes (Signed)
[facility] EMERGENCY DEPARTMENT Provider Note   CSN: [id number] Arrival date & time: 05/15/19  6948     History   Chief Complaint Chief Complaint  Patient presents with  . Fever  . Chills  . Generalized Body Aches    HPI Susan Daniels is a 63 y.o. female.     The history is provided by the patient.  Fever Max temp prior to arrival:  102 Temp source:  Oral Severity:  Moderate Onset quality:  Sudden Duration:  1 day Timing:  Constant Progression:  Unchanged Chronicity:  New Relieved by:  Acetaminophen Worsened by:  Nothing Ineffective treatments:  None tried Associated symptoms: cough, headaches and myalgias   Associated symptoms: no chest pain, no confusion, no congestion, no dysuria, no nausea, no rash, no rhinorrhea and no vomiting   Risk factors comment:  Patient went to be at the end of June and states she usually wears her mask except when she is eating at [employer].   Past Medical History:  Diagnosis Date  . Arthritis    Rheumatoid  . Diabetes mellitus without complication ([place])   . Headache(784.0)    migraines  . Heart murmur     Patient Active Problem List   Diagnosis Date Noted  . Autoimmune disease ([place]) 06/03/2017  . High risk medication use 06/03/2017  . Primary osteoarthritis of both hands 06/03/2017  . Primary osteoarthritis of both knees 06/03/2017  . Primary osteoarthritis of both feet 06/03/2017  . Heart murmur 06/03/2017  . History of hypothyroidism 06/03/2017  . Dyslipidemia 06/03/2017  . History of diabetes mellitus 06/03/2017  . Vitamin D deficiency 06/03/2017    Past Surgical History:  Procedure Laterality Date  . COLONOSCOPY WITH PROPOFOL N/A 01/30/2013   Procedure: COLONOSCOPY WITH PROPOFOL;  Surgeon: Garlan Fair, MD;  Location: [facility];  Service: Endoscopy;  Laterality: N/A;  . dental implant  09/29/2018  . TUBAL LIGATION       OB History   No obstetric history on file.      Home  Medications    Prior to Admission medications   Medication Sig Start Date End Date Taking? Authorizing Provider  Cholecalciferol (VITAMIN D PO) Take by mouth daily.    [provider]  diclofenac sodium (VOLTAREN) 1 % GEL Apply 3 gm to 3 large joints up to 3 times a day.Dispense 3 tubes with 3 refills. 09/29/18   Bo Merino, MD  Fexofenadine HCl (ALLERGY 24-HR PO) Take by mouth as needed.     [provider]  hydroxychloroquine (PLAQUENIL) 200 MG tablet Take by mouth 2 (two) times daily.    [provider]  lisinopril (PRINIVIL,ZESTRIL) 2.5 MG tablet lisinopril 2.5 mg tablet    [provider]  metFORMIN (GLUCOPHAGE) 500 MG tablet Take 500 mg by mouth 2 (two) times daily with a meal.    [provider]  simvastatin (ZOCOR) 10 MG tablet Take 10 mg by mouth daily at 6 PM.  05/17/17   [provider]  Vitamin D, Ergocalciferol, (DRISDOL) 50000 units CAPS capsule Take 50,000 Units by mouth every 7 (seven) days.    [provider]    Family History Family History  Problem Relation Age of Onset  . Healthy Mother   . Heart disease Father   . Rheumatic fever Father   . Hypertension Sister   . Rheumatic fever Sister   . Healthy Son   . Healthy Daughter     Social History Social  History   Tobacco Use  . Smoking status: Former Smoker    Packs/day: 0.50    Years: 7.00    Pack years: 3.50    Types: Cigarettes    Quit date: 11/09/1979    Years since quitting: 39.5  . Smokeless tobacco: Never Used  Substance Use Topics  . Alcohol use: Yes    Comment: occasionally   . Drug use: Never     Allergies   Aspirin, Epinephrine, and Hydrocodone   Review of Systems Review of Systems  Constitutional: Positive for fever.  HENT: Negative for congestion and rhinorrhea.   Respiratory: Positive for cough.   Cardiovascular: Negative for chest pain.  Gastrointestinal: Negative for nausea and vomiting.  Genitourinary: Negative  for dysuria.  Musculoskeletal: Positive for myalgias.  Skin: Negative for rash.  Neurological: Positive for headaches.  Psychiatric/Behavioral: Negative for confusion.  All other systems reviewed and are negative.    Physical Exam Updated Vital Signs BP 139/64   Pulse 87   Temp (S) (!) 100.9 F (38.3 C) (Oral)   Resp (!) 23   SpO2 93%   Physical Exam Vitals signs and nursing note reviewed.  Constitutional:      General: She is not in acute distress.    Appearance: She is well-developed. She is obese.  HENT:     Head: Normocephalic and atraumatic.  Eyes:     Pupils: Pupils are equal, round, and reactive to light.  Cardiovascular:     Rate and Rhythm: Normal rate and regular rhythm.     Heart sounds: Normal heart sounds. No murmur. No friction rub.  Pulmonary:     Effort: Pulmonary effort is normal.     Breath sounds: Normal breath sounds. No wheezing or rales.  Abdominal:     General: Bowel sounds are normal. There is no distension.     Palpations: Abdomen is soft.     Tenderness: There is no abdominal tenderness. There is no guarding or rebound.  Musculoskeletal: Normal range of motion.        General: No tenderness.     Right lower leg: No edema.     Left lower leg: No edema.     Comments: No edema  Skin:    General: Skin is warm and dry.     Capillary Refill: Capillary refill takes less than 2 seconds.     Findings: No rash.  Neurological:     Mental Status: She is alert and oriented to person, place, and time.     Cranial Nerves: No cranial nerve deficit.  Psychiatric:        Mood and Affect: Mood normal.        Behavior: Behavior normal.        Thought Content: Thought content normal.      ED Treatments / Results  Labs (all labs ordered are listed, but only abnormal results are displayed) Labs Reviewed  NOVEL CORONAVIRUS, NAA (HOSPITAL ORDER, SEND-OUT TO REF LAB)    EKG None  Radiology No results found.  Procedures Procedures (including  critical care time)  Medications Ordered in ED Medications - No data to display   Initial Impression / Assessment and Plan / ED Course  I have reviewed the triage vital signs and the nursing notes.  Pertinent labs & imaging results that were available during my care of the patient were reviewed by me and considered in my medical decision making (see chart for details).    Pt with symptoms consistent with viral  process most likely COVID.  Well appearing here.  No signs of breathing difficulty  No signs of pharyngitis, otitis or abnormal abdominal findings.  No meningeal signs or tick exposure concerning for [facility] spotted fever.  COVID testing sent but patient is stable for discharge.    Susan Daniels was evaluated in Emergency Department on 05/15/2019 for the symptoms described in the history of present illness. She was evaluated in the context of the global COVID-19 pandemic, which necessitated consideration that the patient might be at risk for infection with the SARS-CoV-2 virus that causes COVID-19. Institutional protocols and algorithms that pertain to the evaluation of patients at risk for COVID-19 are in a state of rapid change based on information released by regulatory bodies including the CDC and federal and state organizations. These policies and algorithms were followed during the patient's care in the ED.   Final Clinical Impressions(s) / ED Diagnoses   Final diagnoses:  Suspected Covid-19 Virus Infection    ED Discharge Orders    None       Blanchie Dessert, MD 05/15/19 1026

## 2019-05-15 NOTE — ED Triage Notes (Addendum)
Pt c/o headache/ chills / fever that began yesterday afternoon ; denies any cough or sob ; pt states she went to the beach towards the end of June

## 2019-05-17 LAB — NOVEL CORONAVIRUS, NAA (HOSP ORDER, SEND-OUT TO REF LAB; TAT 18-24 HRS): SARS-CoV-2, NAA: NOT DETECTED

## 2019-05-18 ENCOUNTER — Encounter (HOSPITAL_COMMUNITY): Payer: Self-pay

## 2019-05-18 ENCOUNTER — Emergency Department (HOSPITAL_COMMUNITY)
Admission: EM | Admit: 2019-05-18 | Discharge: 2019-05-19 | Disposition: A | Discharge status: Home / Self Care | Payer: BC Managed Care – PPO | Attending: Emergency Medicine | Admitting: Emergency Medicine

## 2019-05-18 ENCOUNTER — Other Ambulatory Visit: Payer: Self-pay | Admitting: Internal Medicine

## 2019-05-18 ENCOUNTER — Ambulatory Visit
Admission: RE | Admit: 2019-05-18 | Discharge: 2019-05-18 | Disposition: A | Discharge status: Home / Self Care | Payer: BC Managed Care – PPO | Source: Ambulatory Visit | Attending: Internal Medicine | Admitting: Internal Medicine

## 2019-05-18 ENCOUNTER — Other Ambulatory Visit: Payer: Self-pay

## 2019-05-18 DIAGNOSIS — Z79899 Other long term (current) drug therapy: Secondary | ICD-10-CM | POA: Insufficient documentation

## 2019-05-18 DIAGNOSIS — D72819 Decreased white blood cell count, unspecified: Secondary | ICD-10-CM | POA: Diagnosis not present

## 2019-05-18 DIAGNOSIS — Z7984 Long term (current) use of oral hypoglycemic drugs: Secondary | ICD-10-CM | POA: Insufficient documentation

## 2019-05-18 DIAGNOSIS — R509 Fever, unspecified: Secondary | ICD-10-CM

## 2019-05-18 DIAGNOSIS — R05 Cough: Secondary | ICD-10-CM | POA: Diagnosis not present

## 2019-05-18 DIAGNOSIS — K820 Obstruction of gallbladder: Secondary | ICD-10-CM | POA: Diagnosis not present

## 2019-05-18 DIAGNOSIS — R945 Abnormal results of liver function studies: Secondary | ICD-10-CM | POA: Insufficient documentation

## 2019-05-18 DIAGNOSIS — Z87891 Personal history of nicotine dependence: Secondary | ICD-10-CM | POA: Diagnosis not present

## 2019-05-18 DIAGNOSIS — E1169 Type 2 diabetes mellitus with other specified complication: Secondary | ICD-10-CM | POA: Diagnosis not present

## 2019-05-18 DIAGNOSIS — E119 Type 2 diabetes mellitus without complications: Secondary | ICD-10-CM | POA: Insufficient documentation

## 2019-05-18 DIAGNOSIS — J9811 Atelectasis: Secondary | ICD-10-CM | POA: Diagnosis not present

## 2019-05-18 DIAGNOSIS — E039 Hypothyroidism, unspecified: Secondary | ICD-10-CM | POA: Diagnosis not present

## 2019-05-18 DIAGNOSIS — R7989 Other specified abnormal findings of blood chemistry: Secondary | ICD-10-CM

## 2019-05-18 LAB — COMPREHENSIVE METABOLIC PANEL
ALT: 109 U/L — ABNORMAL HIGH (ref 0–44)
AST: 79 U/L — ABNORMAL HIGH (ref 15–41)
Albumin: 3.8 g/dL (ref 3.5–5.0)
Alkaline Phosphatase: 180 U/L — ABNORMAL HIGH (ref 38–126)
Anion gap: 11 (ref 5–15)
BUN: 10 mg/dL (ref 8–23)
CO2: 23 mmol/L (ref 22–32)
Calcium: 8.9 mg/dL (ref 8.9–10.3)
Chloride: 97 mmol/L — ABNORMAL LOW (ref 98–111)
Creatinine, Ser: 1.16 mg/dL — ABNORMAL HIGH (ref 0.44–1.00)
GFR calc Af Amer: 58 mL/min — ABNORMAL LOW (ref >60)
GFR calc non Af Amer: 50 mL/min — ABNORMAL LOW (ref >60)
Glucose, Bld: 305 mg/dL — ABNORMAL HIGH (ref 70–99)
Potassium: 3.7 mmol/L (ref 3.5–5.1)
Sodium: 131 mmol/L — ABNORMAL LOW (ref 135–145)
Total Bilirubin: 0.6 mg/dL (ref 0.3–1.2)
Total Protein: 6.8 g/dL (ref 6.5–8.1)

## 2019-05-18 LAB — CBC WITH DIFFERENTIAL/PLATELET
Abs Immature Granulocytes: 0.02 10*3/uL (ref 0.00–0.07)
Basophils Absolute: 0 10*3/uL (ref 0.0–0.1)
Basophils Relative: 1 %
Eosinophils Absolute: 0 10*3/uL (ref 0.0–0.5)
Eosinophils Relative: 1 %
HCT: 39.6 % (ref 36.0–46.0)
Hemoglobin: 13.2 g/dL (ref 12.0–15.0)
Immature Granulocytes: 1 %
Lymphocytes Relative: 34 %
Lymphs Abs: 1 10*3/uL (ref 0.7–4.0)
MCH: 29.9 pg (ref 26.0–34.0)
MCHC: 33.3 g/dL (ref 30.0–36.0)
MCV: 89.6 fL (ref 80.0–100.0)
Monocytes Absolute: 0.2 10*3/uL (ref 0.1–1.0)
Monocytes Relative: 8 %
Neutro Abs: 1.7 10*3/uL (ref 1.7–7.7)
Neutrophils Relative %: 55 %
Platelets: 148 10*3/uL — ABNORMAL LOW (ref 150–400)
RBC: 4.42 MIL/uL (ref 3.87–5.11)
RDW: 12.3 % (ref 11.5–15.5)
WBC: 3 10*3/uL — ABNORMAL LOW (ref 4.0–10.5)
nRBC: 0 % (ref 0.0–0.2)

## 2019-05-18 LAB — URINALYSIS, ROUTINE W REFLEX MICROSCOPIC
Bilirubin Urine: NEGATIVE
Glucose, UA: >=500 mg/dL — AB
Hgb urine dipstick: NEGATIVE
Ketones, ur: 5 mg/dL — AB
Nitrite: NEGATIVE
Protein, ur: 30 mg/dL — AB
Specific Gravity, Urine: 1.018 (ref 1.005–1.030)
pH: 5 (ref 5.0–8.0)

## 2019-05-18 LAB — LACTIC ACID, PLASMA: Lactic Acid, Venous: 1.1 mmol/L (ref 0.5–1.9)

## 2019-05-18 MED ORDER — SODIUM CHLORIDE 0.9% FLUSH: 3.0000 mL | Freq: Once | INTRAVENOUS | Status: DC

## 2019-05-18 NOTE — ED Triage Notes (Signed)
Pt sent here by PCP for abnormal labs, WBC count low, liver enzymes elevated and glucose elevated. Pt denies any pain, c.o dizziness. Pt a.o, nad noted

## 2019-05-19 ENCOUNTER — Emergency Department (HOSPITAL_COMMUNITY): Payer: BC Managed Care – PPO

## 2019-05-19 DIAGNOSIS — K820 Obstruction of gallbladder: Secondary | ICD-10-CM | POA: Diagnosis not present

## 2019-05-19 DIAGNOSIS — R05 Cough: Secondary | ICD-10-CM | POA: Diagnosis not present

## 2019-05-19 LAB — ACETAMINOPHEN LEVEL: Acetaminophen (Tylenol), Serum: <10 ug/mL — ABNORMAL LOW (ref 10–30)

## 2019-05-19 MED ORDER — SODIUM CHLORIDE 0.9 % IV BOLUS
1000.0000 mL | Freq: Once | INTRAVENOUS | Status: AC
  Administered 2019-05-19: 1000 mL via INTRAVENOUS

## 2019-05-19 NOTE — ED Provider Notes (Signed)
[facility] EMERGENCY DEPARTMENT Provider Note   CSN: [id number] Arrival date & time: 05/18/19  2001     History   Chief Complaint Chief Complaint  Patient presents with  . Abnormal Lab  . Fever    HPI Susan Daniels is a 63 y.o. female.     Patient presents to the emergency department with a chief complaint of abnormal labs.  She states that she was sent in by primary care provider, who told her that her white blood cell count was low, and that her liver enzymes were high.  She states that she has had some intermittent fevers over the past few days and had a dry cough.  She was tested for coronavirus, but the test was negative.  She denies any abdominal pain, nausea, vomiting, or dysuria.  She has taken Tylenol for her symptoms.  She denies any other associated symptoms.  The history is provided by the patient. No language interpreter was used.    Past Medical History:  Diagnosis Date  . Arthritis    Rheumatoid  . Diabetes mellitus without complication ([place])   . Headache(784.0)    migraines  . Heart murmur     Patient Active Problem List   Diagnosis Date Noted  . Autoimmune disease ([place]) 06/03/2017  . High risk medication use 06/03/2017  . Primary osteoarthritis of both hands 06/03/2017  . Primary osteoarthritis of both knees 06/03/2017  . Primary osteoarthritis of both feet 06/03/2017  . Heart murmur 06/03/2017  . History of hypothyroidism 06/03/2017  . Dyslipidemia 06/03/2017  . History of diabetes mellitus 06/03/2017  . Vitamin D deficiency 06/03/2017    Past Surgical History:  Procedure Laterality Date  . COLONOSCOPY WITH PROPOFOL N/A 01/30/2013   Procedure: COLONOSCOPY WITH PROPOFOL;  Surgeon: Garlan Fair, MD;  Location: [facility];  Service: Endoscopy;  Laterality: N/A;  . dental implant  09/29/2018  . TUBAL LIGATION       OB History   No obstetric history on file.      Home Medications    Prior to Admission  medications   Medication Sig Start Date End Date Taking? Authorizing Provider  Cholecalciferol (VITAMIN D PO) Take by mouth daily.    [provider]  diclofenac sodium (VOLTAREN) 1 % GEL Apply 3 gm to 3 large joints up to 3 times a day.Dispense 3 tubes with 3 refills. 09/29/18   Bo Merino, MD  Fexofenadine HCl (ALLERGY 24-HR PO) Take by mouth as needed.     [provider]  hydroxychloroquine (PLAQUENIL) 200 MG tablet Take by mouth 2 (two) times daily.    [provider]  lisinopril (PRINIVIL,ZESTRIL) 2.5 MG tablet lisinopril 2.5 mg tablet    [provider]  metFORMIN (GLUCOPHAGE) 500 MG tablet Take 500 mg by mouth 2 (two) times daily with a meal.    [provider]  simvastatin (ZOCOR) 10 MG tablet Take 10 mg by mouth daily at 6 PM.  05/17/17   [provider]  Vitamin D, Ergocalciferol, (DRISDOL) 50000 units CAPS capsule Take 50,000 Units by mouth every 7 (seven) days.    [provider]    Family History Family History  Problem Relation Age of Onset  . Healthy Mother   . Heart disease Father   . Rheumatic fever Father   . Hypertension Sister   . Rheumatic fever Sister   . Healthy Son   . Healthy Daughter     Social History Social History  Tobacco Use  . Smoking status: Former Smoker    Packs/day: 0.50    Years: 7.00    Pack years: 3.50    Types: Cigarettes    Quit date: 11/09/1979    Years since quitting: 39.5  . Smokeless tobacco: Never Used  Substance Use Topics  . Alcohol use: Yes    Comment: occasionally   . Drug use: Never     Allergies   Aspirin, Epinephrine, and Hydrocodone   Review of Systems Review of Systems  All other systems reviewed and are negative.    Physical Exam Updated Vital Signs BP 138/73 (BP Location: Right Arm)   Pulse 84   Temp 98.6 F (37 C) (Oral)   Resp (!) 22   SpO2 99%   Physical Exam Vitals signs and nursing note reviewed.  Constitutional:       General: She is not in acute distress.    Appearance: She is well-developed.  HENT:     Head: Normocephalic and atraumatic.  Eyes:     Conjunctiva/sclera: Conjunctivae normal.  Neck:     Musculoskeletal: Neck supple.  Cardiovascular:     Rate and Rhythm: Normal rate and regular rhythm.     Heart sounds: No murmur.  Pulmonary:     Effort: Pulmonary effort is normal. No respiratory distress.     Breath sounds: Normal breath sounds.     Comments: Lung sounds are clear to auscultation Abdominal:     Palpations: Abdomen is soft.     Tenderness: There is no abdominal tenderness.     Comments: Abdomen is soft and nontender, no right upper quadrant tenderness  Skin:    General: Skin is warm and dry.  Neurological:     Mental Status: She is alert and oriented to person, place, and time.  Psychiatric:        Mood and Affect: Mood normal.        Behavior: Behavior normal.        Thought Content: Thought content normal.        Judgment: Judgment normal.      ED Treatments / Results  Labs (all labs ordered are listed, but only abnormal results are displayed) Labs Reviewed  COMPREHENSIVE METABOLIC PANEL - Abnormal; Notable for the following components:      Result Value   Sodium 131 (*)    Chloride 97 (*)    Glucose, Bld 305 (*)    Creatinine, Ser 1.16 (*)    AST 79 (*)    ALT 109 (*)    Alkaline Phosphatase 180 (*)    GFR calc non Af Amer 50 (*)    GFR calc Af Amer 58 (*)    All other components within normal limits  CBC WITH DIFFERENTIAL/PLATELET - Abnormal; Notable for the following components:   WBC 3.0 (*)    Platelets 148 (*)    All other components within normal limits  URINALYSIS, ROUTINE W REFLEX MICROSCOPIC - Abnormal; Notable for the following components:   Glucose, UA >=500 (*)    Ketones, ur 5 (*)    Protein, ur 30 (*)    Leukocytes,Ua TRACE (*)    Bacteria, UA RARE (*)    All other components within normal limits  LACTIC ACID, PLASMA  LACTIC ACID, PLASMA     EKG None  Radiology Dg Chest 2 View  Result Date: 05/18/2019 CLINICAL DATA:  Fever for 5 days, tested negative for COVID-19 on 05/17/2019, history diabetes mellitus EXAM: CHEST - 2 VIEW  COMPARISON:  None FINDINGS: Normal heart size, mediastinal contours, and pulmonary vascularity. Minimal RIGHT basilar atelectasis. Lungs otherwise clear. No infiltrate, pleural effusion or pneumothorax. No acute osseous findings. IMPRESSION: Minimal RIGHT basilar atelectasis. Electronically Signed   By: Lavonia Dana M.D.   On: 05/18/2019 16:09   US Abdomen Limited  Result Date: 05/19/2019 CLINICAL DATA:  Elevated LFT EXAM: ULTRASOUND ABDOMEN LIMITED RIGHT UPPER QUADRANT COMPARISON:  None. FINDINGS: Gallbladder: Contracted gallbladder. Upper normal wall thickness. No shadowing stone. Negative sonographic Murphy Common bile duct: Diameter: 4.3 mm Liver: Increased hepatic echogenicity without focal abnormality. Portal vein is patent on color Doppler imaging with normal direction of blood flow towards the liver. IMPRESSION: 1. Contracted gallbladder. Negative for stones or biliary dilatation 2. Echogenic liver consistent with steatosis and or hepatocellular disease Electronically Signed   By: Donavan Foil M.D.   On: 05/19/2019 01:05   Dg Chest Port 1 View  Result Date: 05/19/2019 CLINICAL DATA:  Cough EXAM: PORTABLE CHEST 1 VIEW COMPARISON:  05/18/2019 FINDINGS: The heart size and mediastinal contours are within normal limits. No focal airspace disease or effusion. Linear scar or atelectasis at the right base. The visualized skeletal structures are unremarkable. IMPRESSION: No active disease. Electronically Signed   By: Donavan Foil M.D.   On: 05/19/2019 01:04    Procedures Procedures (including critical care time)  Medications Ordered in ED Medications  sodium chloride flush (NS) 0.9 % injection 3 mL (has no administration in time range)     Initial Impression / Assessment and Plan / ED Course  I have  reviewed the triage vital signs and the nursing notes.  Pertinent labs & imaging results that were available during my care of the patient were reviewed by me and considered in my medical decision making (see chart for details).        Patient sent to emergency department over abnormal labs of leukopenia and elevated LFTs.  On my exam, the patient is in no acute distress.  Her abdomen is soft and nontender.  She looks well appearing.  She does endorse using Tylenol frequently, but has been taking it as directed.  Her Tylenol level is normal here.  Discussed case with Dr. Wyvonnia Dusky, who recommends checking right upper quadrant ultrasound.  Ultrasound shows hepatic steatosis versus hepatocellular disease.  I discussed these results with the patient, and recommend that she follow-up with her primary care doctor.  I also ordered a hepatitis panel which is pending.  Patient is stable for discharge and continued outpatient follow-up.  Final Clinical Impressions(s) / ED Diagnoses   Final diagnoses:  Elevated LFTs  Leukopenia, unspecified type    ED Discharge Orders    None       Montine Circle, PA-C 05/19/19 0224    Ezequiel Essex, MD 05/19/19 [phone]

## 2019-05-19 NOTE — ED Notes (Signed)
Patient transported to Ultrasound 

## 2019-05-19 NOTE — Discharge Instructions (Signed)
You were evaluated in the emergency department tonight due to your white blood cell count being low and your liver function tests being elevated.  At this time, no acute process requiring admission to the hospital has been found.  We believe you to be stable to follow-up with your primary care provider.  You had a right upper quadrant ultrasound performed which showed no evidence of gallbladder infection, but did show hepatic steatosis versus hepatocellular disease.  Please discuss these findings with your doctor.  A hepatitis panel was ordered, but will not result for a few days.  Your Tylenol level is nonelevated.  Please limit your Tylenol use.  If your symptoms change or worsen please return to the emergency department.

## 2019-05-20 LAB — HEPATITIS PANEL, ACUTE
HCV Ab: <0.1 s/co ratio (ref 0.0–0.9)
Hep A IgM: NEGATIVE
Hep B C IgM: NEGATIVE
Hepatitis B Surface Ag: NEGATIVE

## 2019-05-21 ENCOUNTER — Telehealth: Payer: Self-pay | Admitting: Rheumatology

## 2019-05-21 DIAGNOSIS — Z79899 Other long term (current) drug therapy: Secondary | ICD-10-CM

## 2019-05-21 NOTE — Telephone Encounter (Signed)
Patient stating having new symptoms last week, fever, chills, nauseated one day. Patient's fever got up to 102, but has not had a fever since Saturday. Patient was sent for a Covid test which was negative. Patient had labs drawn, and had a high liver function. Patient states doctor questioned Plaquenil as cause. Patient would  Like to discuss. Please call to advise.

## 2019-05-21 NOTE — Telephone Encounter (Signed)
Returned patient call.  Instructed patient to decreased Plaquenil dose.  She is currently taking 200 mg twice daily Monday through Friday.  Instructed her to take 1 tablet daily and will recheck WBC in 1 month.  Patient verbalized understanding.  Patient concerned about elevated LFT's.  Plaquenil is not typically associated with liver injury and more associated with results of fatty liver which was seen on the ultrasound in the ED.  Patient verbalized understanding.  All questions encouraged and answered.  Instructed patient to call with any further questions or concerns.  Mariella Saa, PharmD, [facility] Clinical Pharmacist  05/21/2019 1:55 PM

## 2019-05-21 NOTE — Telephone Encounter (Signed)
Yes she should be on reduced dose of Plaquenil 500 mg twice daily Monday through Friday.

## 2019-05-23 DIAGNOSIS — E1165 Type 2 diabetes mellitus with hyperglycemia: Secondary | ICD-10-CM | POA: Diagnosis not present

## 2019-05-23 DIAGNOSIS — R0602 Shortness of breath: Secondary | ICD-10-CM | POA: Diagnosis not present

## 2019-05-23 DIAGNOSIS — M069 Rheumatoid arthritis, unspecified: Secondary | ICD-10-CM | POA: Diagnosis not present

## 2019-05-23 DIAGNOSIS — N3090 Cystitis, unspecified without hematuria: Secondary | ICD-10-CM | POA: Diagnosis not present

## 2019-05-23 DIAGNOSIS — R791 Abnormal coagulation profile: Secondary | ICD-10-CM | POA: Diagnosis not present

## 2019-05-23 DIAGNOSIS — R7989 Other specified abnormal findings of blood chemistry: Secondary | ICD-10-CM | POA: Diagnosis not present

## 2019-05-23 DIAGNOSIS — I200 Unstable angina: Secondary | ICD-10-CM | POA: Diagnosis not present

## 2019-05-23 DIAGNOSIS — I2510 Atherosclerotic heart disease of native coronary artery without angina pectoris: Secondary | ICD-10-CM | POA: Diagnosis not present

## 2019-05-23 DIAGNOSIS — R748 Abnormal levels of other serum enzymes: Secondary | ICD-10-CM | POA: Diagnosis not present

## 2019-05-23 DIAGNOSIS — Z794 Long term (current) use of insulin: Secondary | ICD-10-CM | POA: Diagnosis not present

## 2019-05-23 DIAGNOSIS — N3000 Acute cystitis without hematuria: Secondary | ICD-10-CM | POA: Diagnosis not present

## 2019-05-23 DIAGNOSIS — I214 Non-ST elevation (NSTEMI) myocardial infarction: Secondary | ICD-10-CM | POA: Diagnosis not present

## 2019-05-23 DIAGNOSIS — R001 Bradycardia, unspecified: Secondary | ICD-10-CM | POA: Diagnosis not present

## 2019-05-23 DIAGNOSIS — Z7984 Long term (current) use of oral hypoglycemic drugs: Secondary | ICD-10-CM | POA: Diagnosis not present

## 2019-05-23 DIAGNOSIS — I361 Nonrheumatic tricuspid (valve) insufficiency: Secondary | ICD-10-CM | POA: Diagnosis not present

## 2019-05-23 DIAGNOSIS — E782 Mixed hyperlipidemia: Secondary | ICD-10-CM | POA: Diagnosis not present

## 2019-05-23 DIAGNOSIS — E1169 Type 2 diabetes mellitus with other specified complication: Secondary | ICD-10-CM | POA: Diagnosis not present

## 2019-05-23 DIAGNOSIS — I340 Nonrheumatic mitral (valve) insufficiency: Secondary | ICD-10-CM | POA: Diagnosis not present

## 2019-05-23 DIAGNOSIS — I10 Essential (primary) hypertension: Secondary | ICD-10-CM | POA: Diagnosis not present

## 2019-05-23 DIAGNOSIS — R0789 Other chest pain: Secondary | ICD-10-CM | POA: Diagnosis not present

## 2019-05-23 DIAGNOSIS — R079 Chest pain, unspecified: Secondary | ICD-10-CM | POA: Diagnosis not present

## 2019-05-23 DIAGNOSIS — I25119 Atherosclerotic heart disease of native coronary artery with unspecified angina pectoris: Secondary | ICD-10-CM | POA: Diagnosis not present

## 2019-05-23 DIAGNOSIS — Z79899 Other long term (current) drug therapy: Secondary | ICD-10-CM | POA: Diagnosis not present

## 2019-05-31 ENCOUNTER — Telehealth: Payer: Self-pay | Admitting: Rheumatology

## 2019-05-31 NOTE — Telephone Encounter (Signed)
Patient scheduled a virtual appt for 7/27 with Dr. Estanislado Pandy. In the mean time patient would like to know if Lovena Le has any recommendations that she can try to help ease her hand pain? Patient uses pain patches, ice packs, and Blue Emu with no relief.

## 2019-05-31 NOTE — Telephone Encounter (Signed)
She can use voltaren gel topically 4 times daily as needed for pain relief.

## 2019-05-31 NOTE — Telephone Encounter (Signed)
Patient advised she can use Voltaren gel topically 4 times daily as needed for pain relief. Patient verbalized understanding.

## 2019-06-01 DIAGNOSIS — I10 Essential (primary) hypertension: Secondary | ICD-10-CM | POA: Diagnosis not present

## 2019-06-01 DIAGNOSIS — I2510 Atherosclerotic heart disease of native coronary artery without angina pectoris: Secondary | ICD-10-CM | POA: Diagnosis not present

## 2019-06-01 DIAGNOSIS — E782 Mixed hyperlipidemia: Secondary | ICD-10-CM | POA: Diagnosis not present

## 2019-06-04 ENCOUNTER — Ambulatory Visit (INDEPENDENT_AMBULATORY_CARE_PROVIDER_SITE_OTHER): Payer: BC Managed Care – PPO | Admitting: Rheumatology

## 2019-06-04 ENCOUNTER — Other Ambulatory Visit: Payer: Self-pay

## 2019-06-04 ENCOUNTER — Encounter: Payer: Self-pay | Admitting: Rheumatology

## 2019-06-04 DIAGNOSIS — R7989 Other specified abnormal findings of blood chemistry: Secondary | ICD-10-CM

## 2019-06-04 DIAGNOSIS — M170 Bilateral primary osteoarthritis of knee: Secondary | ICD-10-CM

## 2019-06-04 DIAGNOSIS — Z8639 Personal history of other endocrine, nutritional and metabolic disease: Secondary | ICD-10-CM

## 2019-06-04 DIAGNOSIS — M19041 Primary osteoarthritis, right hand: Secondary | ICD-10-CM | POA: Diagnosis not present

## 2019-06-04 DIAGNOSIS — E785 Hyperlipidemia, unspecified: Secondary | ICD-10-CM

## 2019-06-04 DIAGNOSIS — M19072 Primary osteoarthritis, left ankle and foot: Secondary | ICD-10-CM

## 2019-06-04 DIAGNOSIS — Z79899 Other long term (current) drug therapy: Secondary | ICD-10-CM

## 2019-06-04 DIAGNOSIS — M19071 Primary osteoarthritis, right ankle and foot: Secondary | ICD-10-CM

## 2019-06-04 DIAGNOSIS — R011 Cardiac murmur, unspecified: Secondary | ICD-10-CM

## 2019-06-04 DIAGNOSIS — M19042 Primary osteoarthritis, left hand: Secondary | ICD-10-CM

## 2019-06-04 DIAGNOSIS — M359 Systemic involvement of connective tissue, unspecified: Secondary | ICD-10-CM | POA: Diagnosis not present

## 2019-06-04 DIAGNOSIS — R945 Abnormal results of liver function studies: Secondary | ICD-10-CM

## 2019-06-04 NOTE — Progress Notes (Signed)
Virtual Visit via Video Note  I connected with NARYAH CLENNEY on 06/04/19 at  1:00 PM EDT by a video enabled telemedicine application and verified that I am speaking with the correct person using two identifiers.  Location: Patient:At her home Provider: In the office This service was conducted via virtual visit.  Both audio tools were used.  The patient was located at home. I was located in my office.  Consent was obtained prior to the virtual visit and is aware of possible charges through their insurance for this visit.  The patient is an established patient.  Dr. Estanislado Pandy, MD conducted the virtual visit .  Office staff helped with scheduling follow up visits after the service was conducted.    I discussed the limitations of evaluation and management by telemedicine and the availability of in person appointments. The patient expressed understanding and agreed to proceed.   CC: History of Present Illness: Patient is a 63 year old female with a past medical history of autoimmune disease and osteoarthritis.  Patient states that about 2 weeks ago she had mild  heart attack for which she was in the hospital.  She had blood work there which showed elevated LFTs.  She was advised to reduce Plaquenil to once a day.  She was also advised to reduce simvastatin.  She states last Monday she started having increased pain and swelling in her right hand.  She states she started applying diclofenac gel and also started taking Tylenol over-the-counter.  Eventually the symptoms have improved.  None of the other joints are painful or swollen at this point.  Review of Systems  Constitutional: Negative for fever and malaise/fatigue.  Eyes: Negative for photophobia, pain, discharge and redness.  Respiratory: Negative for cough, shortness of breath and wheezing.   Cardiovascular: Negative for chest pain and palpitations.  Gastrointestinal: Negative for blood in stool, constipation and diarrhea.  Genitourinary:  Negative for dysuria.  Musculoskeletal: Positive for joint pain. Negative for back pain, myalgias and neck pain.  Skin: Negative for rash.  Neurological: Negative for dizziness and headaches.  Psychiatric/Behavioral: Negative for depression. The patient is not nervous/anxious and does not have insomnia.       Observations/Objective: Physical Exam  Constitutional: She is oriented to person, place, and time and well-developed, well-nourished, and in no distress.  HENT:  Head: Normocephalic and atraumatic.  Eyes: Conjunctivae are normal.  Pulmonary/Chest: Effort normal.  Neurological: She is alert and oriented to person, place, and time.  Psychiatric: Mood, memory, affect and judgment normal.    Patient reports morning stiffness for 10 minutes.   Patient denies nocturnal pain.  Difficulty dressing/grooming: Denies Difficulty climbing stairs: Denies Difficulty getting out of chair: Denies Difficulty using hands for taps, buttons, cutlery, and/or writing: Reports  Assessment and Plan:  Autoimmune disease-history of rash, arthralgias, positive ANA(ENA, C3-C4, anticardiolipin, beta-2, lupus anticoagulant was negative.  ANA was 1: 320 speckled), photosensitivity: Patient has done well on Plaquenil 200 mg twice daily.  Her Plaquenil dose was reduced recently.  She states she had a flare with increased pain and swelling in her right hand for which she took Tylenol and applied topical diclofenac gel.  The dose of Plaquenil was reduced due to elevated LFTs.  Have discouraged the use of any anti-inflammatories, Tylenol and diclofenac gel.  If she has recurrence of swelling she may consider increasing the dose of Plaquenil which would be safer than the other options.  Elevated LFTs-I am uncertain about the true etiology of elevated LFTs.  She was on a statins which were recently discontinued.  Her LFTs need to be repeated.  Patient states that she will schedule an appointment with her PCP.  Her  autoimmune labs need to be repeated as well.  Patient include ANA double-stranded DNA, C3-C4, ESR, UA, CBC and CMP.  If patient does not get through her PCPs then she can come in and get these through our office.  Osteoarthritis of both hands-she is some stiffness but not having any much discomfort currently except for recent flare with increased joint swelling.  Osteoarthritis of both knees-currently not having much discomfort.  Osteoarthritis of both feet-patient denies any discomfort currently.  History of vitamin D deficiency-she is on vitamin D supplement.  She will need repeat vitamin D level with the next labs.  Other medical problems include history of hyperlipidemia, history of diabetes mellitus, heart murmur and hypothyroidism.  Patient states she had recent MI.   Follow Up Instructions: She will follow up in 2 months   I discussed the assessment and treatment plan with the patient. The patient was provided an opportunity to ask questions and all were answered. The patient agreed with the plan and demonstrated an understanding of the instructions.   The patient was advised to call back or seek an in-person evaluation if the symptoms worsen or if the condition fails to improve as anticipated.  I provided 25 minutes of non-face-to-face time during this encounter.   Bo Merino, MD

## 2019-06-07 DIAGNOSIS — I219 Acute myocardial infarction, unspecified: Secondary | ICD-10-CM | POA: Diagnosis not present

## 2019-06-07 DIAGNOSIS — N390 Urinary tract infection, site not specified: Secondary | ICD-10-CM | POA: Diagnosis not present

## 2019-06-07 DIAGNOSIS — E119 Type 2 diabetes mellitus without complications: Secondary | ICD-10-CM | POA: Diagnosis not present

## 2019-07-09 DIAGNOSIS — E039 Hypothyroidism, unspecified: Secondary | ICD-10-CM | POA: Diagnosis not present

## 2019-07-09 DIAGNOSIS — E119 Type 2 diabetes mellitus without complications: Secondary | ICD-10-CM | POA: Diagnosis not present

## 2019-07-09 DIAGNOSIS — I219 Acute myocardial infarction, unspecified: Secondary | ICD-10-CM | POA: Diagnosis not present

## 2019-07-09 DIAGNOSIS — N390 Urinary tract infection, site not specified: Secondary | ICD-10-CM | POA: Diagnosis not present

## 2019-07-10 NOTE — Progress Notes (Signed)
Office Visit Note  Patient: Susan Daniels             Date of Birth: June 25, 1956           MRN: [id number]             PCP: Josetta Huddle, MD Referring: Josetta Huddle, MD Visit Date: 07/24/2019 Occupation: @GUAROCC @  Subjective:  Right foot pain   History of Present Illness: Susan Daniels is a 63 y.o. female with history of autoimmune disease and osteoarthritis.  Patient is on Plaquenil 200 mg 1 tablet by mouth twice daily.  She denies any signs or symptoms of a flare.  She denies any recent rashes, symptoms of Raynaud's, oral nasal ulcerations, sicca symptoms, shortness of breath, or chest pain.  She reports ongoing hair loss and photosensitivity.  She states 1 week ago she developed right foot pain after eating shrimp the night before.  She states she developed diffuse pain and swelling.  She states the pain was severe and she had difficulty bearing weight.  She was unable to wear shoe due to the discomfort and has diffuse swelling.  She was diagnosed with gout by her PCP.  According to the patient she had a uric acid level checked but she is unsure what the lab value was.  She was prescribed a 7-day course of medication but is unsure what the name of it was.  Tomorrow is her last day of the prescription.  She continues to have some residual pain and swelling in the right foot but her pain has subsided substantially. Patient states that she had Visco gel injections in the right knee joint in January 2020.  She states that these injections were very effective and she has not had any discomfort or joint swelling since.  She denies any other joint pain or joint swelling at this time.   Activities of Daily Living:  Patient reports morning stiffness for 0 none.   Patient Denies nocturnal pain.  Difficulty dressing/grooming: Denies Difficulty climbing stairs: Reports Difficulty getting out of chair: Denies Difficulty using hands for taps, buttons, cutlery, and/or writing: Denies   Review of Systems  Constitutional: Positive for fatigue.  HENT: Negative for mouth sores, mouth dryness and nose dryness.   Eyes: Negative for pain, visual disturbance and dryness.  Respiratory: Negative for cough, hemoptysis, shortness of breath and difficulty breathing.   Cardiovascular: Negative for chest pain, palpitations, hypertension and swelling in legs/feet.  Gastrointestinal: Negative for blood in stool, constipation and diarrhea.  Endocrine: Negative for cold intolerance and increased urination.  Genitourinary: Negative for difficulty urinating and painful urination.  Musculoskeletal: Positive for arthralgias, joint pain and joint swelling. Negative for myalgias, muscle weakness, morning stiffness, muscle tenderness and myalgias.  Skin: Negative for color change, pallor, rash, hair loss, nodules/bumps, skin tightness, ulcers and sensitivity to sunlight.  Neurological: Negative for dizziness, numbness, headaches and weakness.  Hematological: Negative for bruising/bleeding tendency and swollen glands.  Psychiatric/Behavioral: Positive for sleep disturbance. Negative for depressed mood. The patient is not nervous/anxious.     PMFS History:  Patient Active Problem List   Diagnosis Date Noted  . Autoimmune disease ([place]) 06/03/2017  . High risk medication use 06/03/2017  . Primary osteoarthritis of both hands 06/03/2017  . Primary osteoarthritis of both knees 06/03/2017  . Primary osteoarthritis of both feet 06/03/2017  . Heart murmur 06/03/2017  . History of hypothyroidism 06/03/2017  . Dyslipidemia 06/03/2017  . History of diabetes mellitus 06/03/2017  . Vitamin D deficiency  06/03/2017    Past Medical History:  Diagnosis Date  . Arthritis    Rheumatoid  . Diabetes mellitus without complication ([place])   . Headache(784.0)    migraines  . Heart murmur     Family History  Problem Relation Age of Onset  . Healthy Mother   . Heart disease Father   . Rheumatic fever Father    . Hypertension Sister   . Rheumatic fever Sister   . Healthy Son   . Healthy Daughter    Past Surgical History:  Procedure Laterality Date  . COLONOSCOPY WITH PROPOFOL N/A 01/30/2013   Procedure: COLONOSCOPY WITH PROPOFOL;  Surgeon: Garlan Fair, MD;  Location: [facility];  Service: Endoscopy;  Laterality: N/A;  . dental implant  09/29/2018  . HEART STENT    . TUBAL LIGATION     Social History   Social History Narrative  . Not on file    There is no immunization history on file for this patient.   Objective: Vital Signs: BP 108/61 (BP Location: Left Arm, Patient Position: Sitting, Cuff Size: Normal)   Pulse 72   Resp 16   Ht 5\' 2"  (1.575 m)   Wt 189 lb 10.1 oz (86 kg)   BMI 34.68 kg/m    Physical Exam Vitals signs and nursing note reviewed.  Constitutional:      Appearance: She is well-developed.  HENT:     Head: Normocephalic and atraumatic.  Eyes:     Conjunctiva/sclera: Conjunctivae normal.  Neck:     Musculoskeletal: Normal range of motion.  Cardiovascular:     Rate and Rhythm: Normal rate and regular rhythm.     Heart sounds: Normal heart sounds.  Pulmonary:     Effort: Pulmonary effort is normal.     Breath sounds: Normal breath sounds.  Abdominal:     General: Bowel sounds are normal.     Palpations: Abdomen is soft.  Lymphadenopathy:     Cervical: No cervical adenopathy.  Skin:    General: Skin is warm and dry.     Capillary Refill: Capillary refill takes less than 2 seconds.  Neurological:     Mental Status: She is alert and oriented to person, place, and time.  Psychiatric:        Behavior: Behavior normal.      Musculoskeletal Exam: C-spine, thoracic spine, lumbar spine good range of motion.  No midline spinal tenderness.  No SI joint tenderness.  Shoulder joints, elbow joints, wrist joints, MCPs, PIPs, DIPs good range of motion with no synovitis.  She has PIP and DIP synovial thickening consistent with osteoarthritis of bilateral hands.   Hip joints, knee joints, ankle joints have good range of motion no synovitis.  No warmth or effusion of bilateral knee joints.  No tenderness or swelling of ankle joints.  Tenderness, erythema, and swelling overlying there right 1st MTP joint.  Diffuse soft tissue swelling overlying right MTP joints.    CDAI Exam: CDAI Score: - Patient Global: -; Provider Global: - Swollen: -; Tender: - Joint Exam   No joint exam has been documented for this visit   There is currently no information documented on the homunculus. Go to the Rheumatology activity and complete the homunculus joint exam.  Investigation: No additional findings.  Imaging: No results found.  Recent Labs: Lab Results  Component Value Date   WBC 3.0 (L) 05/18/2019   HGB 13.2 05/18/2019   PLT 148 (L) 05/18/2019   NA 131 (L) 05/18/2019  K 3.7 05/18/2019   CL 97 (L) 05/18/2019   CO2 23 05/18/2019   GLUCOSE 305 (H) 05/18/2019   BUN 10 05/18/2019   CREATININE 1.16 (H) 05/18/2019   BILITOT 0.6 05/18/2019   ALKPHOS 180 (H) 05/18/2019   AST 79 (H) 05/18/2019   ALT 109 (H) 05/18/2019   PROT 6.8 05/18/2019   ALBUMIN 3.8 05/18/2019   CALCIUM 8.9 05/18/2019   GFRAA 58 (L) 05/18/2019    Speciality Comments: PLQ eye exam: 09/15/2017 Normal. Dr. Dyke Maes. Follow up in 1 year.  Procedures:  No procedures performed Allergies: Aspirin, Epinephrine, and Hydrocodone   Assessment / Plan:     Visit Diagnoses: Autoimmune disease ([place]) - history of rash, arthralgias, positive ANA(ENA, C3-C4, anticardiolipin, beta-2, lupus anticoagulant was negative.  ANA was 1: 320 speckled), photosensitivity: She has not had any signs or symptoms of a flare recently.  She is clinically doing well on Plaquenil 200 mg 1 tablet by mouth twice daily.  No Malar rash was noted.  She has not had any recent rashes but continues to have photosensitivity and wears sunscreen on a regular basis.  She has not had any symptoms of Raynaud's.  No digital  ulcerations or signs of gangrene were noted.  She has ongoing hair loss but does not feel as though her symptoms have been worsening.  She denies any shortness of breath, palpitations, or chest pain.  She denies any worsening fatigue, swollen lymph nodes, or fever.  She has not had any oral nasal ulcerations.  She denies any sicca symptoms.  She will continue taking Plaquenil as prescribed.  She does not need any refills at this time.  We will check autoimmune lab work today.  She was advised to notify us if she develops increased joint pain or joint swelling.  She will follow-up in the office in 1 month. - Plan: C3 and C4, Anti-DNA antibody, double-stranded, Sedimentation rate, Urinalysis, Routine w reflex microscopic, COMPLETE METABOLIC PANEL WITH GFR, CBC with Differential/Platelet  High risk medication use -Plaquenil 200 mg 1 tablet by mouth twice daily.  CBC and CMP will be drawn today to monitor for drug toxicity.  She has an upcoming Plaquenil eye exam scheduled.  She has had to reschedule several times due to COVID-19 pandemic.  She was given a Plaquenil eye exam form to take with her to her upcoming appointment.- Plan: COMPLETE METABOLIC PANEL WITH GFR, CBC with Differential/Platelet  Primary osteoarthritis of both hands: She has PIP and DIP synovial thickening assist with osteoarthritis of bilateral hands.  She has complete fist formation bilaterally.  Joint protection and muscle strengthening were discussed.  Primary osteoarthritis of both knees: She has good range of motion with no discomfort. No warmth or effusion of knee joints noted. She had Visco gel injections in the right knee joint in January 2020 which provided significant relief.  She does not want to reapply for the injections yet.  She will notify us if she develops increased joint pain or joint swelling.  Primary osteoarthritis of both feet: She has PIP and DIP synovial thickening consistent with osteoarthritis of bilateral feet.   Pain in right foot: She reports that about 1 week ago she developed severe right foot pain.  She had tenderness, erythema, and diffuse swelling on the dorsal aspect of the right foot.  She states the pain was so severe she was unable to bear full weight.  She was evaluated by her PCP and had lab work performed.  She says she  was diagnosed with gout and was treated with a seven-day course of medication but is unsure what the medication was.  She states it was not prednisone.  She was advised to call her PCP and have the results faxed to our office.  She will follow-up in the office in 1 month to further discuss recent lab results.  In the meantime she was advised to avoid red meat, shellfish, and alcohol.  She is advised to notify us if the pain and swelling returns before the upcoming office visit.   Other medical conditions are listed as follows:   History of vitamin D deficiency  History of diabetes mellitus  History of hypothyroidism  Dyslipidemia  Heart murmur  Elevated LFTs  Orders: Orders Placed This Encounter  Procedures  . C3 and C4  . Anti-DNA antibody, double-stranded  . Sedimentation rate  . Urinalysis, Routine w reflex microscopic  . COMPLETE METABOLIC PANEL WITH GFR  . CBC with Differential/Platelet   No orders of the defined types were placed in this encounter.     Follow-Up Instructions: Return in about 4 weeks (around 08/21/2019) for Autoimmune Disease, Osteoarthritis.   Ofilia Neas, PA-C   I examined and evaluated the patient with Hazel Sams PA.  Patient had recent episode of foot swelling.  She was treated by her PCP with the most likely prednisone taper.  Patient is unclear about the medication she took.  The swelling has come down.  She had uric acid test recently.  I do not have those results available.  Have advised patient to forward those results to [place].  We would like to see her back in a month to restart her on medications depending on her uric acid  levels.  The plan of care was discussed as noted above.  Bo Merino, MD  Note - This record has been created using Editor, commissioning.  Chart creation errors have been sought, but may not always  have been located. Such creation errors do not reflect on  the standard of medical care.

## 2019-07-19 ENCOUNTER — Ambulatory Visit: Payer: BC Managed Care – PPO | Admitting: Rheumatology

## 2019-07-19 DIAGNOSIS — M21611 Bunion of right foot: Secondary | ICD-10-CM | POA: Diagnosis not present

## 2019-07-19 DIAGNOSIS — M79674 Pain in right toe(s): Secondary | ICD-10-CM | POA: Diagnosis not present

## 2019-07-24 ENCOUNTER — Ambulatory Visit: Payer: BC Managed Care – PPO | Admitting: Rheumatology

## 2019-07-24 ENCOUNTER — Other Ambulatory Visit: Payer: Self-pay

## 2019-07-24 ENCOUNTER — Encounter: Payer: Self-pay | Admitting: Rheumatology

## 2019-07-24 VITALS — BP 108/61 | HR 72 | Resp 16 | Ht 62.0 in | Wt 189.6 lb

## 2019-07-24 DIAGNOSIS — M170 Bilateral primary osteoarthritis of knee: Secondary | ICD-10-CM | POA: Diagnosis not present

## 2019-07-24 DIAGNOSIS — M79671 Pain in right foot: Secondary | ICD-10-CM

## 2019-07-24 DIAGNOSIS — R7989 Other specified abnormal findings of blood chemistry: Secondary | ICD-10-CM

## 2019-07-24 DIAGNOSIS — M19071 Primary osteoarthritis, right ankle and foot: Secondary | ICD-10-CM

## 2019-07-24 DIAGNOSIS — Z8639 Personal history of other endocrine, nutritional and metabolic disease: Secondary | ICD-10-CM

## 2019-07-24 DIAGNOSIS — M19041 Primary osteoarthritis, right hand: Secondary | ICD-10-CM | POA: Diagnosis not present

## 2019-07-24 DIAGNOSIS — M359 Systemic involvement of connective tissue, unspecified: Secondary | ICD-10-CM

## 2019-07-24 DIAGNOSIS — Z79899 Other long term (current) drug therapy: Secondary | ICD-10-CM

## 2019-07-24 DIAGNOSIS — R945 Abnormal results of liver function studies: Secondary | ICD-10-CM

## 2019-07-24 DIAGNOSIS — R011 Cardiac murmur, unspecified: Secondary | ICD-10-CM

## 2019-07-24 DIAGNOSIS — E785 Hyperlipidemia, unspecified: Secondary | ICD-10-CM

## 2019-07-24 DIAGNOSIS — M19072 Primary osteoarthritis, left ankle and foot: Secondary | ICD-10-CM

## 2019-07-24 DIAGNOSIS — M19042 Primary osteoarthritis, left hand: Secondary | ICD-10-CM

## 2019-07-25 LAB — CBC WITH DIFFERENTIAL/PLATELET
Absolute Monocytes: 510 cells/uL (ref 200–950)
Basophils Absolute: 59 cells/uL (ref 0–200)
Basophils Relative: 1.2 %
Eosinophils Absolute: 108 cells/uL (ref 15–500)
Eosinophils Relative: 2.2 %
HCT: 36.6 % (ref 35.0–45.0)
Hemoglobin: 12.1 g/dL (ref 11.7–15.5)
Lymphs Abs: 1343 cells/uL (ref 850–3900)
MCH: 29.4 pg (ref 27.0–33.0)
MCHC: 33.1 g/dL (ref 32.0–36.0)
MCV: 88.8 fL (ref 80.0–100.0)
MPV: 11.8 fL (ref 7.5–12.5)
Monocytes Relative: 10.4 %
Neutro Abs: 2881 cells/uL (ref 1500–7800)
Neutrophils Relative %: 58.8 %
Platelets: 240 10*3/uL (ref 140–400)
RBC: 4.12 10*6/uL (ref 3.80–5.10)
RDW: 12.8 % (ref 11.0–15.0)
Total Lymphocyte: 27.4 %
WBC: 4.9 10*3/uL (ref 3.8–10.8)

## 2019-07-25 LAB — COMPLETE METABOLIC PANEL WITH GFR
AG Ratio: 1.7 (calc) (ref 1.0–2.5)
ALT: 27 U/L (ref 6–29)
AST: 22 U/L (ref 10–35)
Albumin: 3.8 g/dL (ref 3.6–5.1)
Alkaline phosphatase (APISO): 60 U/L (ref 37–153)
BUN: 8 mg/dL (ref 7–25)
CO2: 25 mmol/L (ref 20–32)
Calcium: 8.7 mg/dL (ref 8.6–10.4)
Chloride: 108 mmol/L (ref 98–110)
Creat: 0.83 mg/dL (ref 0.50–0.99)
GFR, Est African American: 87 mL/min/{1.73_m2} (ref >=60)
GFR, Est Non African American: 75 mL/min/{1.73_m2} (ref >=60)
Globulin: 2.2 g/dL (calc) (ref 1.9–3.7)
Glucose, Bld: 156 mg/dL — ABNORMAL HIGH (ref 65–99)
Potassium: 3.5 mmol/L (ref 3.5–5.3)
Sodium: 141 mmol/L (ref 135–146)
Total Bilirubin: 0.5 mg/dL (ref 0.2–1.2)
Total Protein: 6 g/dL — ABNORMAL LOW (ref 6.1–8.1)

## 2019-07-25 LAB — URINALYSIS, ROUTINE W REFLEX MICROSCOPIC
Bacteria, UA: NONE SEEN /HPF
Bilirubin Urine: NEGATIVE
Glucose, UA: NEGATIVE
Hgb urine dipstick: NEGATIVE
Ketones, ur: NEGATIVE
Nitrite: NEGATIVE
Protein, ur: NEGATIVE
Specific Gravity, Urine: 1.011 (ref 1.001–1.03)
pH: 5.5 (ref 5.0–8.0)

## 2019-07-25 LAB — ANTI-DNA ANTIBODY, DOUBLE-STRANDED: ds DNA Ab: <1 IU/mL

## 2019-07-25 LAB — C3 AND C4
C3 Complement: 121 mg/dL (ref 83–193)
C4 Complement: 25 mg/dL (ref 15–57)

## 2019-07-25 LAB — SEDIMENTATION RATE: Sed Rate: 9 mm/h (ref 0–30)

## 2019-07-25 NOTE — Progress Notes (Signed)
Sed rate WNL. UA revealed findings consistent with UTI.  Please notify patient and forward labs to PCP.  Glucose elevated-156.  CBC WNL.

## 2019-07-25 NOTE — Progress Notes (Signed)
Complements WNL.  DsDNA negative.

## 2019-08-01 NOTE — Progress Notes (Signed)
Office Visit Note  Patient: Susan Daniels             Date of Birth: 04-12-1956           MRN: [id number]             PCP: Josetta Huddle, MD Referring: Josetta Huddle, MD Visit Date: 08/08/2019 Occupation: @GUAROCC @  Subjective:  Right great toe pain   History of Present Illness: Susan Daniels is a 63 y.o. female with history of autoimmune disease and osteoarthritis.  She is taking plaquenil 200 mg 1 tablet by mouth twice daily.  She denies any signs or symptoms of an autoimmune flare recently.  She has not had any recent rashes, photosensitivity, symptoms of Raynaud's, oral nasal ulcerations, sicca symptoms, or hair loss.  She denies any shortness of breath, chest pain, or palpitations.  She has not had any worsening fatigue, fevers or swollen lymph nodes recently. She presents today with tenderness, warmth, swelling, and erythema of the right first MTP joint.  She states the pain started about 4 days ago.  She states the pain was severe last night and she had difficulty sleeping due to discomfort.  She is having difficulty bearing weight and wearing shoes.  She states that it is very sensitive to touch and she had difficulty applying ice due to the discomfort.  She states that she did apply blue emu which provided temporary relief.  She states that she was evaluated by her PCP on 07/29/2019 and was treated for gout at that time.  She took indomethacin for 7 days which resolved her symptoms.  She states that about 1 week later her symptoms returned and she took indomethacin for several days.  She states that her last dose of indomethacin was on Thursday and then her symptoms started on Sunday.  She has not taken any indomethacin since then.  She states that on Saturday night she had a glass of wine and she occasionally has ground beef.  She denies eating any shellfish recently.  Activities of Daily Living:  Patient reports morning stiffness for 0  minutes.   Patient Reports nocturnal pain.   Difficulty dressing/grooming: Denies Difficulty climbing stairs: Reports Difficulty getting out of chair: Denies Difficulty using hands for taps, buttons, cutlery, and/or writing: Denies  Review of Systems  Constitutional: Negative for fatigue.  HENT: Negative for mouth sores, mouth dryness and nose dryness.   Eyes: Negative for pain, visual disturbance and dryness.  Respiratory: Negative for cough, hemoptysis, shortness of breath and difficulty breathing.   Cardiovascular: Negative for chest pain, palpitations, hypertension and swelling in legs/feet.  Gastrointestinal: Negative for blood in stool, constipation and diarrhea.  Endocrine: Negative for increased urination.  Genitourinary: Negative for painful urination.  Musculoskeletal: Positive for arthralgias, joint pain and joint swelling. Negative for myalgias, muscle weakness, morning stiffness, muscle tenderness and myalgias.  Skin: Negative for color change, pallor, rash, hair loss, nodules/bumps, skin tightness, ulcers and sensitivity to sunlight.  Allergic/Immunologic: Negative for susceptible to infections.  Neurological: Negative for dizziness, numbness, headaches and weakness.  Hematological: Negative for swollen glands.  Psychiatric/Behavioral: Negative for depressed mood and sleep disturbance. The patient is not nervous/anxious.     PMFS History:  Patient Active Problem List   Diagnosis Date Noted   Autoimmune disease ([place]) 06/03/2017   High risk medication use 06/03/2017   Primary osteoarthritis of both hands 06/03/2017   Primary osteoarthritis of both knees 06/03/2017   Primary osteoarthritis of both feet 06/03/2017  Heart murmur 06/03/2017   History of hypothyroidism 06/03/2017   Dyslipidemia 06/03/2017   History of diabetes mellitus 06/03/2017   Vitamin D deficiency 06/03/2017    Past Medical History:  Diagnosis Date   Arthritis    Rheumatoid   Diabetes mellitus without complication (HCC)     KGYJEHUD(149.7)    migraines   Heart murmur     Family History  Problem Relation Age of Onset   Healthy Mother    Heart disease Father    Rheumatic fever Father    Hypertension Sister    Rheumatic fever Sister    Healthy Son    Healthy Daughter    Past Surgical History:  Procedure Laterality Date   COLONOSCOPY WITH PROPOFOL N/A 01/30/2013   Procedure: COLONOSCOPY WITH PROPOFOL;  Surgeon: Garlan Fair, MD;  Location: [facility];  Service: Endoscopy;  Laterality: N/A;   dental implant  09/29/2018   HEART STENT     TUBAL LIGATION     Social History   Social History Narrative   Not on file    There is no immunization history on file for this patient.   Objective: Vital Signs: BP (!) 145/81 (BP Location: Left Arm, Patient Position: Sitting, Cuff Size: Normal)    Pulse 73    Resp 13    Ht 5' 2"  (1.575 m)    Wt 185 lb 3.2 oz (84 kg)    BMI 33.87 kg/m    Physical Exam Vitals signs and nursing note reviewed.  Constitutional:      Appearance: She is well-developed.  HENT:     Head: Normocephalic and atraumatic.  Eyes:     Conjunctiva/sclera: Conjunctivae normal.  Neck:     Musculoskeletal: Normal range of motion.  Cardiovascular:     Rate and Rhythm: Normal rate and regular rhythm.     Heart sounds: Normal heart sounds.  Pulmonary:     Effort: Pulmonary effort is normal.     Breath sounds: Normal breath sounds.  Abdominal:     General: Bowel sounds are normal.     Palpations: Abdomen is soft.  Lymphadenopathy:     Cervical: No cervical adenopathy.  Skin:    General: Skin is warm and dry.     Capillary Refill: Capillary refill takes less than 2 seconds.  Neurological:     Mental Status: She is alert and oriented to person, place, and time.  Psychiatric:        Behavior: Behavior normal.      Musculoskeletal Exam: C-spine, thoracic spine, and lumbar spine good ROM.  No midline spinal tenderness.  No SI joint tenderness.  Shoulder joints, elbow  joints, wrist joints, MCPs, PIPs, and DIPs good ROM with no synovitis.  PIP and DIP synovial thickening consistent with osteoarthritis of both hands.  CMC joint synovial thickening bilaterally.  Hip joints, knee joints, ankle joints good ROM with no synovitis.  Diffuse swelling on the dorsal aspect of the right foot.  Right 1st MTP joint tenderness, erythema, and inflammation on exam.    CDAI Exam: CDAI Score: -- Patient Global: --; Provider Global: -- Swollen: --; Tender: -- Joint Exam   No joint exam has been documented for this visit   There is currently no information documented on the homunculus. Go to the Rheumatology activity and complete the homunculus joint exam.  Investigation: No additional findings.  Imaging: Xr Foot 2 Views Right  Result Date: 08/08/2019 PIP and DIP narrowing was noted.  First MTP narrowing and valgus  deformity was noted.  Some cystic versus erosive changes were noted in the first MTP which could be consistent with osteoarthritis or inflammatory arthritis. Impression: Osteoarthritis of the foot with first MTP erosive versus cystic changes which could be consistent with gouty arthropathy.   Recent Labs: Lab Results  Component Value Date   WBC 4.9 07/24/2019   HGB 12.1 07/24/2019   PLT 240 07/24/2019   NA 141 07/24/2019   K 3.5 07/24/2019   CL 108 07/24/2019   CO2 25 07/24/2019   GLUCOSE 156 (H) 07/24/2019   BUN 8 07/24/2019   CREATININE 0.83 07/24/2019   BILITOT 0.5 07/24/2019   ALKPHOS 180 (H) 05/18/2019   AST 22 07/24/2019   ALT 27 07/24/2019   PROT 6.0 (L) 07/24/2019   ALBUMIN 3.8 05/18/2019   CALCIUM 8.7 07/24/2019   GFRAA 87 07/24/2019    Speciality Comments: PLQ eye exam: 09/15/2017 Normal. Dr. Dyke Maes. Follow up in 1 year.  Procedures:  No procedures performed Allergies: Aspirin, Epinephrine, and Hydrocodone    Assessment / Plan:     Visit Diagnoses: Autoimmune disease ([place]) - history of rash, arthralgias, positive ANA(ENA,  C3-C4, anticardiolipin, beta-2, lupus anticoagulant was negative.  ANA was 1: 320 speckled), photosensitivity: She has not had any signs or symptoms of an autoimmune flare recently.  She is clinically doing well on Plaquenil 200 mg 1 tablet twice daily.  She has not had any since [place] rash, photosensitivity, symptoms of raynaud's, oral or nasal ulcerations, hair loss, chest pain, shortness of breath, or palpitations recently.  She presents today with erythema, tenderness, and inflammation of the right first MTP joint.  The clinical findings are suspicious for a gout flare.  The management plan was discussed below.  She has no other joint inflammation at this time.  She will continue taking Plaquenil as prescribed.  She does not need any refills at this time.  Autoimmune lab work was checked on 07/22/2019 sed rate 9, double-stranded DNA negative, complements within normal limits.  She was advised to notify us if she develops signs or symptoms of a flare.  She will follow-up in the office in 1 month  High risk medication use - Plaquenil 200 mg 1 tablet by mouth twice daily.  Last Plaquenil eye exam normal on 09/15/2017. Most recent CBC/CMP within normal limits except for elevated alk phos on 07/24/2019.  Primary osteoarthritis of both hands: She has PIP and DIP synovial thickening consistent with osteoarthritis of both hands.  She has bilateral CMC joint synovial thickening.  No tenderness or synovitis noted.  She has no discomfort in her hands at this time.  She has complete fist formation bilaterally.  Joint protection and muscle strengthening were discussed.  Primary osteoarthritis of both knees: She had right knee Visco gel injections in January to February 2020 which provided significant relief.  She has very mild discomfort intermittently in the right knee.  She has no warmth or effusion at this time.  She has no left knee joint pain at this time.  Pain in right foot -She presents today with right first  MTP joint tenderness and inflammation.  X-rays of the right foot were obtained today.  She has right first MTP joint narrowing and cystic versus erosive changes were noted.  Plan: XR Foot 2 Views Right  Great toe pain, right - She presents today with right first MTP joint tenderness, erythema, and inflammation.  Initially on 07/19/2019 she was having tenderness and inflammation of the right great toe  and was evaluated by her PCP.  Her PCP had a clinical suspicion that it was gout and has a uric acid which was 5.4 at that time.  She is started on indomethacin which she took for 7 days which resolved her symptoms.  She states that about 1 week after finishing a course of indomethacin the symptoms returned so she restarted on indomethacin.  She discontinued indomethacin on Thursday and her symptoms returned on Sunday.  She has tenderness of the right first MTP joint and diffuse swelling on the dorsal aspect of the right foot.  She is having difficulty bearing weight and has had significant nocturnal pain. x-rays of the right foot were obtained today.  The first MTP joint revealed narrowing and valgus deformity.  Cystic versus erosive changes were noted in the first MTP consistent with osteoarthritis or inflammatory arthritis.  We discussed the diagnosis of gout today.  Patient education was also provided.  She was advised to avoid red meat, shellfish, and beer.  We will recheck a uric acid level today.  She will be starting on colchicine 0.6 mg 1 tablet by mouth daily.  She was advised to notify us if she cannot tolerate taking colchicine.  She will follow-up in the office in 1 month.  We will recheck uric acid level at that time.  Plan: Uric acid   Primary osteoarthritis of both feet: She has been having pain in the right foot primarily in the right first MTP joint for the past 4 days.  There is a high suspicion for gout.  Uric acid was 5.4 on 07/19/2019.  We will recheck uric acid level today.  X-rays of the right  foot were obtained today which revealed findings consistent with osteoarthritis and possible cystic versus erosive changes at the first MTP joint.   She has been having difficulty bearing weight on the right foot and has been having nocturnal pain.  She has bunions bilaterally. She has not having any discomfort in the left foot at this time.    History of vitamin D deficiency: She is taking a vitamin D supplement.   Other medical conditions are listed as follows:  History of diabetes mellitus  History of hypothyroidism  Dyslipidemia  Heart murmur  Elevated LFTs   Orders: Orders Placed This Encounter  Procedures   XR Foot 2 Views Right   Uric acid   No orders of the defined types were placed in this encounter.   Face-to-face time spent with patient was 30 minutes. Greater than 50% of time was spent in counseling and coordination of care.  Follow-Up Instructions: Return in 1 month (on 09/07/2019) for Autoimmune Disease.   Ofilia Neas, PA-C   I examined and evaluated the patient with Hazel Sams PA.  Jessey had painful swollen right first MTP today.  The x-rays obtained today showed some cystic versus erosive changes consistent with gouty arthropathy.  We will try colchicine for right now.  We will also check her uric acid level.  Sometimes the uric acid level could be normal in the face of acute gout flare.  Dietary modifications and abstinence from alcohol were discussed at length.  The plan of care was discussed as noted above.  Bo Merino, MD Note - This record has been created using Editor, commissioning.  Chart creation errors have been sought, but may not always  have been located. Such creation errors do not reflect on  the standard of medical care.

## 2019-08-07 ENCOUNTER — Ambulatory Visit: Payer: BC Managed Care – PPO | Admitting: Rheumatology

## 2019-08-07 DIAGNOSIS — I2510 Atherosclerotic heart disease of native coronary artery without angina pectoris: Secondary | ICD-10-CM | POA: Diagnosis not present

## 2019-08-08 ENCOUNTER — Ambulatory Visit: Payer: Self-pay

## 2019-08-08 ENCOUNTER — Other Ambulatory Visit: Payer: Self-pay

## 2019-08-08 ENCOUNTER — Ambulatory Visit: Payer: BC Managed Care – PPO | Admitting: Rheumatology

## 2019-08-08 ENCOUNTER — Encounter: Payer: Self-pay | Admitting: Rheumatology

## 2019-08-08 VITALS — BP 145/81 | HR 73 | Resp 13 | Ht 62.0 in | Wt 185.2 lb

## 2019-08-08 DIAGNOSIS — M170 Bilateral primary osteoarthritis of knee: Secondary | ICD-10-CM

## 2019-08-08 DIAGNOSIS — M79674 Pain in right toe(s): Secondary | ICD-10-CM | POA: Diagnosis not present

## 2019-08-08 DIAGNOSIS — Z8639 Personal history of other endocrine, nutritional and metabolic disease: Secondary | ICD-10-CM

## 2019-08-08 DIAGNOSIS — Z79899 Other long term (current) drug therapy: Secondary | ICD-10-CM | POA: Diagnosis not present

## 2019-08-08 DIAGNOSIS — M359 Systemic involvement of connective tissue, unspecified: Secondary | ICD-10-CM | POA: Diagnosis not present

## 2019-08-08 DIAGNOSIS — E785 Hyperlipidemia, unspecified: Secondary | ICD-10-CM

## 2019-08-08 DIAGNOSIS — R945 Abnormal results of liver function studies: Secondary | ICD-10-CM

## 2019-08-08 DIAGNOSIS — M19072 Primary osteoarthritis, left ankle and foot: Secondary | ICD-10-CM

## 2019-08-08 DIAGNOSIS — M19042 Primary osteoarthritis, left hand: Secondary | ICD-10-CM

## 2019-08-08 DIAGNOSIS — M79671 Pain in right foot: Secondary | ICD-10-CM

## 2019-08-08 DIAGNOSIS — R7989 Other specified abnormal findings of blood chemistry: Secondary | ICD-10-CM

## 2019-08-08 DIAGNOSIS — R011 Cardiac murmur, unspecified: Secondary | ICD-10-CM

## 2019-08-08 DIAGNOSIS — M19041 Primary osteoarthritis, right hand: Secondary | ICD-10-CM | POA: Diagnosis not present

## 2019-08-08 DIAGNOSIS — M19071 Primary osteoarthritis, right ankle and foot: Secondary | ICD-10-CM

## 2019-08-08 MED ORDER — COLCHICINE 0.6 MG PO TABS: 0.6000 mg | ORAL_TABLET | Freq: Every day | ORAL | 1 refills | Status: DC

## 2019-08-09 ENCOUNTER — Telehealth: Payer: Self-pay | Admitting: Rheumatology

## 2019-08-09 LAB — URIC ACID: Uric Acid, Serum: 6.3 mg/dL (ref 2.5–7.0)

## 2019-08-09 NOTE — Telephone Encounter (Signed)
Spoke with patient and advised patient the Colchicine is the medication at the pharmacy she is to pick up and start. Patient advised that Allopurinol is the medication that she advised the providers she did not want to start at this time. Patient advised if she changes her mind about the Allopurinol then to contact the office.

## 2019-08-09 NOTE — Progress Notes (Signed)
Uric acid is 6.3.  Please advise patient to take colchicine 0.6 mg po daily.   Please advise the patient to notify us if she cannot tolerate taking colchicine.

## 2019-08-09 NOTE — Telephone Encounter (Signed)
Patient calling in reference to Allopurinol, and Colchicine. Patient confused about which one she is suppose to be getting. She that she was to get Allopurinol, but Colchicine was sent in. Please call to confirm.

## 2019-08-20 ENCOUNTER — Ambulatory Visit: Payer: BC Managed Care – PPO | Admitting: Rheumatology

## 2019-08-22 DIAGNOSIS — Z79899 Other long term (current) drug therapy: Secondary | ICD-10-CM | POA: Diagnosis not present

## 2019-08-29 NOTE — Progress Notes (Signed)
Office Visit Note  Patient: Susan Daniels             Date of Birth: 1956-08-13           MRN: [id number]             PCP: Josetta Huddle, MD Referring: Josetta Huddle, MD Visit Date: 09/12/2019 Occupation: @GUAROCC @  Subjective:  Medication monitoring    History of Present Illness: Susan Daniels is a 63 y.o. female with history of autoimmune disease and osteoarthritis.  Patient is taking Plaquenil 200 mg 1 tablet by mouth twice daily.  She denies any recent autoimmune disease flares.  She was started on colchicine 0.6 mg 1 tablet by mouth daily after her office visit on 08/08/2019.  She is tolerating colchicine without any side effects. She denies any recent gout flares.  She has no pain in the right great toe since starting on colchicine.    Activities of Daily Living:  Patient reports morning stiffness for 0 none.   Patient Denies nocturnal pain.  Difficulty dressing/grooming: Denies Difficulty climbing stairs: Reports Difficulty getting out of chair: Denies Difficulty using hands for taps, buttons, cutlery, and/or writing: Denies  Review of Systems  Constitutional: Negative for fatigue.  HENT: Negative for mouth sores, mouth dryness and nose dryness.   Eyes: Negative for pain, visual disturbance and dryness.  Respiratory: Negative for cough, hemoptysis, shortness of breath and difficulty breathing.   Cardiovascular: Negative for chest pain, palpitations and hypertension.  Gastrointestinal: Negative for blood in stool, constipation and diarrhea.  Endocrine: Negative for excessive thirst and increased urination.  Genitourinary: Negative for difficulty urinating and painful urination.  Musculoskeletal: Positive for arthralgias and joint pain. Negative for joint swelling, myalgias, muscle weakness, morning stiffness, muscle tenderness and myalgias.  Skin: Negative for color change, pallor, rash, hair loss, nodules/bumps, skin tightness, ulcers and sensitivity to sunlight.   Allergic/Immunologic: Negative for susceptible to infections.  Neurological: Negative for dizziness, numbness, headaches and weakness.  Hematological: Negative for bruising/bleeding tendency and swollen glands.  Psychiatric/Behavioral: Positive for sleep disturbance. Negative for depressed mood. The patient is not nervous/anxious.     PMFS History:  Patient Active Problem List   Diagnosis Date Noted  . Autoimmune disease ([place]) 06/03/2017  . High risk medication use 06/03/2017  . Primary osteoarthritis of both hands 06/03/2017  . Primary osteoarthritis of both knees 06/03/2017  . Primary osteoarthritis of both feet 06/03/2017  . Heart murmur 06/03/2017  . History of hypothyroidism 06/03/2017  . Dyslipidemia 06/03/2017  . History of diabetes mellitus 06/03/2017  . Vitamin D deficiency 06/03/2017    Past Medical History:  Diagnosis Date  . Arthritis    Rheumatoid  . Diabetes mellitus without complication ([place])   . Headache(784.0)    migraines  . Heart murmur     Family History  Problem Relation Age of Onset  . Healthy Mother   . Heart disease Father   . Rheumatic fever Father   . Hypertension Sister   . Rheumatic fever Sister   . Healthy Son   . Healthy Daughter    Past Surgical History:  Procedure Laterality Date  . COLONOSCOPY WITH PROPOFOL N/A 01/30/2013   Procedure: COLONOSCOPY WITH PROPOFOL;  Surgeon: Garlan Fair, MD;  Location: [facility];  Service: Endoscopy;  Laterality: N/A;  . dental implant  09/29/2018  . HEART STENT    . TUBAL LIGATION     Social History   Social History Narrative  . Not on file  There is no immunization history on file for this patient.   Objective: Vital Signs: BP 128/76 (BP Location: Left Arm, Patient Position: Sitting, Cuff Size: Normal)   Pulse 67   Resp 12   Ht 5\' 2"  (1.575 m)   Wt 185 lb 6.4 oz (84.1 kg)   BMI 33.91 kg/m    Physical Exam Vitals signs and nursing note reviewed.  Constitutional:       Appearance: She is well-developed.  HENT:     Head: Normocephalic and atraumatic.  Eyes:     Conjunctiva/sclera: Conjunctivae normal.  Neck:     Musculoskeletal: Normal range of motion.  Cardiovascular:     Rate and Rhythm: Normal rate and regular rhythm.     Heart sounds: Normal heart sounds.  Pulmonary:     Effort: Pulmonary effort is normal.     Breath sounds: Normal breath sounds.  Abdominal:     General: Bowel sounds are normal.     Palpations: Abdomen is soft.  Lymphadenopathy:     Cervical: No cervical adenopathy.  Skin:    General: Skin is warm and dry.     Capillary Refill: Capillary refill takes less than 2 seconds.  Neurological:     Mental Status: She is alert and oriented to person, place, and time.  Psychiatric:        Behavior: Behavior normal.      Musculoskeletal Exam: C-spine, thoracic spine, and lumbar spine good ROM.  No midline spinal tenderness.  No SI joint tenderness.  Shoulder joints, elbow joints, wrist joints, MCPs, PIPs, and DIPs good ROM with no synovitis.  Complete fist formation bilaterally.  PIP and DIP synovial thickening consistent with osteoarthritis of both hands.  Hip joint, knee joints, ankle joints, MTPs, PIPs, and DIPs good ROM with no synovitis.  No warmth or effusion of knee joints.  No tenderness or swelling of ankle joints.  Synovial thickening and erythema of the right 1st MTP joint.    CDAI Exam: CDAI Score: - Patient Global: -; Provider Global: - Swollen: -; Tender: - Joint Exam   No joint exam has been documented for this visit   There is currently no information documented on the homunculus. Go to the Rheumatology activity and complete the homunculus joint exam.  Investigation: No additional findings.  Imaging: No results found.  Recent Labs: Lab Results  Component Value Date   WBC 4.9 07/24/2019   HGB 12.1 07/24/2019   PLT 240 07/24/2019   NA 141 07/24/2019   K 3.5 07/24/2019   CL 108 07/24/2019   CO2 25  07/24/2019   GLUCOSE 156 (H) 07/24/2019   BUN 8 07/24/2019   CREATININE 0.83 07/24/2019   BILITOT 0.5 07/24/2019   ALKPHOS 180 (H) 05/18/2019   AST 22 07/24/2019   ALT 27 07/24/2019   PROT 6.0 (L) 07/24/2019   ALBUMIN 3.8 05/18/2019   CALCIUM 8.7 07/24/2019   GFRAA 87 07/24/2019    Speciality Comments: PLQ eye exam: 08/22/19 Normal. [facility].   Procedures:  No procedures performed Allergies: Aspirin, Epinephrine, and Hydrocodone   Assessment / Plan:     Visit Diagnoses: Autoimmune disease ([place]) - history of rash, arthralgias, positive ANA(ENA, C3-C4, anticardiolipin, beta-2, lupus anticoagulant was negative.  ANA was 1: 320 speckled), photosensitivity: She has not had any signs or symptoms of a flare recently.  She is clinically doing well on Plaquenil 200 mg 1 tablet by mouth twice daily.  She has no synovitis on exam.  She has  not had any recent rashes, photosensitivity, sicca symptoms, oral nasal ulcerations, symptoms of Raynaud's, fevers, or enlarged lymph nodes.  She will continue taking Plaquenil as prescribed.  She does not need any refills at this time.  She was advised to notify us if she develops any new or worsening symptoms.  She will follow-up in the office in 6 weeks.  High risk medication use - Plaquenil 200 mg 1 tablet twice daily.  Last Plaquenil eye exam normal on 08/22/2019.  Most recent CBC/CMP within normal limits on 07/24/2019 and will monitor every 5 months.  - Plan: CBC, CMP  Primary osteoarthritis of both hands: She has PIP and DIP synovial thickening consistent with osteoarthritis of both hands.  No tenderness or synovitis was noted.  She has complete fist formation bilaterally.  She has no discomfort in her hands at this time.  Joint protection and muscle strengthening were discussed.  Primary osteoarthritis of both knees: She has good range of motion of bilateral knee joints.  No warmth or effusion was noted.  Primary osteoarthritis of both feet: She has  osteoarthritic changes in bilateral feet.  She has synovial thickening and erythema of the right first MTP joint.  She has no synovitis or dactylitis on exam.  We discussed the importance of wearing proper fitting shoes with arch support.  Chronic idiopathic gout involving toe of right foot without tophus -She has been taking colchicine 0.6 mg 1 tablet by mouth daily since the beginning of October 2020.  She has been tolerating colchicine without any side effects.  The right great toe pain has resolved.  She has synovial thickening and erythema of the right first MTP joint but no inflammation or tenderness was noted.  We will check her uric acid level today.  She will be starting on allopurinol 100 mg 1 tablet by mouth daily.  Indications, contraindications, potential side effects of allopurinol were discussed.  She will continue taking colchicine as prescribed.  She will follow-up in the office in about 6 weeks and we will recheck uric acid level at that time.  She is advised to notify us if she develops any signs or symptoms of a gout flare.  Plan: Uric acid, CBC, CMP  Great toe pain, right - She has no tenderness or inflammation on exam.  Sh has been taking colchicine 0.6 mg 1 tablet by mouth daily for about 1 month.  Her symptoms have resolved.  We will recheck uric acid level today. uric acid: 08/08/2019 6.3  Other medical conditions are listed as follows:  History of vitamin D deficiency  History of hypothyroidism  History of diabetes mellitus  Dyslipidemia  Elevated LFTs  Heart murmur   Orders: Orders Placed This Encounter  Procedures  . Uric acid  . CBC  . CMP   Meds ordered this encounter  Medications  . allopurinol (ZYLOPRIM) 100 MG tablet    Sig: Take 1 tablet (100 mg total) by mouth daily.    Dispense:  90 tablet    Refill:  0    Face-to-face time spent with patient was 30 minutes. Greater than 50% of time was spent in counseling and coordination of care.  Follow-Up  Instructions: Return in about 6 weeks (around 10/24/2019) for Autoimmune Disease, Osteoarthritis.   Ofilia Neas, PA-C   I examined and evaluated the patient with Hazel Sams PA.  Patient had not had a flare of autoimmune disease.  She was given a recent diagnosis of clinical gout.  She had no  inflammation on examination today.  She states she has done well on daily colchicine.  We will add allopurinol today.  Side effects were discussed at length.  We will see how she responds to allopurinol.  The plan of care was discussed as noted above.  Bo Merino, MD  Note - This record has been created using Editor, commissioning.  Chart creation errors have been sought, but may not always  have been located. Such creation errors do not reflect on  the standard of medical care.

## 2019-08-31 ENCOUNTER — Other Ambulatory Visit: Payer: Self-pay | Admitting: Physician Assistant

## 2019-09-01 DIAGNOSIS — Z1231 Encounter for screening mammogram for malignant neoplasm of breast: Secondary | ICD-10-CM | POA: Diagnosis not present

## 2019-09-12 ENCOUNTER — Other Ambulatory Visit: Payer: Self-pay

## 2019-09-12 ENCOUNTER — Ambulatory Visit: Payer: BC Managed Care – PPO | Admitting: Rheumatology

## 2019-09-12 ENCOUNTER — Encounter: Payer: Self-pay | Admitting: Rheumatology

## 2019-09-12 VITALS — BP 128/76 | HR 67 | Resp 12 | Ht 62.0 in | Wt 185.4 lb

## 2019-09-12 DIAGNOSIS — M19072 Primary osteoarthritis, left ankle and foot: Secondary | ICD-10-CM

## 2019-09-12 DIAGNOSIS — M19042 Primary osteoarthritis, left hand: Secondary | ICD-10-CM

## 2019-09-12 DIAGNOSIS — Z79899 Other long term (current) drug therapy: Secondary | ICD-10-CM

## 2019-09-12 DIAGNOSIS — M359 Systemic involvement of connective tissue, unspecified: Secondary | ICD-10-CM | POA: Diagnosis not present

## 2019-09-12 DIAGNOSIS — M170 Bilateral primary osteoarthritis of knee: Secondary | ICD-10-CM | POA: Diagnosis not present

## 2019-09-12 DIAGNOSIS — M19071 Primary osteoarthritis, right ankle and foot: Secondary | ICD-10-CM

## 2019-09-12 DIAGNOSIS — M1A0710 Idiopathic chronic gout, right ankle and foot, without tophus (tophi): Secondary | ICD-10-CM | POA: Diagnosis not present

## 2019-09-12 DIAGNOSIS — Z8639 Personal history of other endocrine, nutritional and metabolic disease: Secondary | ICD-10-CM

## 2019-09-12 DIAGNOSIS — R011 Cardiac murmur, unspecified: Secondary | ICD-10-CM

## 2019-09-12 DIAGNOSIS — E785 Hyperlipidemia, unspecified: Secondary | ICD-10-CM

## 2019-09-12 DIAGNOSIS — R7989 Other specified abnormal findings of blood chemistry: Secondary | ICD-10-CM

## 2019-09-12 DIAGNOSIS — M79674 Pain in right toe(s): Secondary | ICD-10-CM

## 2019-09-12 DIAGNOSIS — M19041 Primary osteoarthritis, right hand: Secondary | ICD-10-CM

## 2019-09-12 MED ORDER — ALLOPURINOL 100 MG PO TABS: 100.0000 mg | ORAL_TABLET | Freq: Every day | ORAL | 0 refills | Status: DC

## 2019-09-12 NOTE — Progress Notes (Signed)
Pharmacy Note   Subjective:  Patient presents today to the [facility] to see Dr. Estanislado Pandy.  Patient was prescribed allopurinol for gout.  Patient was seen by the pharmacist for counseling on allopurinol and colchicine.  She has been taking colchicine daily and is tolerating well without side effects.   Objective: CBC    Component Value Date/Time   WBC 4.9 07/24/2019 1546   RBC 4.12 07/24/2019 1546   HGB 12.1 07/24/2019 1546   HCT 36.6 07/24/2019 1546   PLT 240 07/24/2019 1546   MCV 88.8 07/24/2019 1546   MCH 29.4 07/24/2019 1546   MCHC 33.1 07/24/2019 1546   RDW 12.8 07/24/2019 1546   LYMPHSABS 1,343 07/24/2019 1546   MONOABS 0.2 05/18/2019 2038   EOSABS 108 07/24/2019 1546   BASOSABS 59 07/24/2019 1546    CMP     Component Value Date/Time   NA 141 07/24/2019 1546   K 3.5 07/24/2019 1546   CL 108 07/24/2019 1546   CO2 25 07/24/2019 1546   GLUCOSE 156 (H) 07/24/2019 1546   BUN 8 07/24/2019 1546   CREATININE 0.83 07/24/2019 1546   CALCIUM 8.7 07/24/2019 1546   PROT 6.0 (L) 07/24/2019 1546   ALBUMIN 3.8 05/18/2019 2038   AST 22 07/24/2019 1546   ALT 27 07/24/2019 1546   ALKPHOS 180 (H) 05/18/2019 2038   BILITOT 0.5 07/24/2019 1546   GFRNONAA 75 07/24/2019 1546   GFRAA 87 07/24/2019 1546    Uric Acid Lab Results  Component Value Date   LABURIC 6.3 08/08/2019      Assessment/Plan:  Counseled patient on the purpose proper use, and adverse effects of allopurinol and colchicine.  Discussed the importance of taking allopurinol every day to lower uric acid levels.  The possibility of recurrent gout while lowering the uric acid was explained and discussed the importance of taking colchicine daily at this time.  Provided patient with medication education material and answered all questions.    She is Crestor 20 mg daily. Drug interaction between Crestor and colchicine when used in combination that increases the risk of myopathy including  rhabdomyolysis.   Patient will only continue colchicine daily for 1 month until her uric acid level is below 6 to prevent a flair.  We will then transition to colchicine as needed.  Made patient aware of drug interaction and to monitor for signs/symptoms of rhabdomyolysis such as severe muscle pain/weakness and dark tea colored urine. Encouraged patient to stay adequately hydrated to help prevent muscle effects.  Patient verbalized understanding.  All questions encouraged and answered.  Instructed patient to call with any other questions or concerns.  Mariella Saa, PharmD, [place], [place] [facility] Pharmacist [phone]  09/12/2019 3:15 PM

## 2019-09-13 LAB — COMPLETE METABOLIC PANEL WITH GFR
AG Ratio: 2 (calc) (ref 1.0–2.5)
ALT: 28 U/L (ref 6–29)
AST: 19 U/L (ref 10–35)
Albumin: 4.3 g/dL (ref 3.6–5.1)
Alkaline phosphatase (APISO): 62 U/L (ref 37–153)
BUN: 20 mg/dL (ref 7–25)
CO2: 30 mmol/L (ref 20–32)
Calcium: 9.3 mg/dL (ref 8.6–10.4)
Chloride: 104 mmol/L (ref 98–110)
Creat: 0.91 mg/dL (ref 0.50–0.99)
GFR, Est African American: 78 mL/min/{1.73_m2} (ref >=60)
GFR, Est Non African American: 67 mL/min/{1.73_m2} (ref >=60)
Globulin: 2.2 g/dL (calc) (ref 1.9–3.7)
Glucose, Bld: 105 mg/dL — ABNORMAL HIGH (ref 65–99)
Potassium: 4 mmol/L (ref 3.5–5.3)
Sodium: 141 mmol/L (ref 135–146)
Total Bilirubin: 0.5 mg/dL (ref 0.2–1.2)
Total Protein: 6.5 g/dL (ref 6.1–8.1)

## 2019-09-13 LAB — CBC WITH DIFFERENTIAL/PLATELET
Absolute Monocytes: 620 cells/uL (ref 200–950)
Basophils Absolute: 59 cells/uL (ref 0–200)
Basophils Relative: 1 %
Eosinophils Absolute: 142 cells/uL (ref 15–500)
Eosinophils Relative: 2.4 %
HCT: 37.6 % (ref 35.0–45.0)
Hemoglobin: 12.1 g/dL (ref 11.7–15.5)
Lymphs Abs: 1741 cells/uL (ref 850–3900)
MCH: 28.5 pg (ref 27.0–33.0)
MCHC: 32.2 g/dL (ref 32.0–36.0)
MCV: 88.7 fL (ref 80.0–100.0)
MPV: 12.4 fL (ref 7.5–12.5)
Monocytes Relative: 10.5 %
Neutro Abs: 3339 cells/uL (ref 1500–7800)
Neutrophils Relative %: 56.6 %
Platelets: 200 10*3/uL (ref 140–400)
RBC: 4.24 10*6/uL (ref 3.80–5.10)
RDW: 12.6 % (ref 11.0–15.0)
Total Lymphocyte: 29.5 %
WBC: 5.9 10*3/uL (ref 3.8–10.8)

## 2019-09-13 LAB — URIC ACID: Uric Acid, Serum: 7.2 mg/dL — ABNORMAL HIGH (ref 2.5–7.0)

## 2019-09-13 NOTE — Progress Notes (Signed)
Uric acid is elevated-7.2.  She will be starting on Allopurinol 100 mg 1 tablet daily and continue taking colchicine 0.6 mg 1 tablet daily.  CBC WNL.  Glucose is 105.  Rest of CMP WNL

## 2019-09-25 ENCOUNTER — Other Ambulatory Visit: Payer: Self-pay | Admitting: Physician Assistant

## 2019-09-25 NOTE — Telephone Encounter (Signed)
Last Visit: 09/12/19 Next Visit: 10/26/19 Labs: 09/12/19 CBC WNL. Glucose is 105. Rest of CMP WNL. Uric acid is elevated-7.2.  Okay to refill per Dr. Estanislado Pandy

## 2019-10-03 DIAGNOSIS — E119 Type 2 diabetes mellitus without complications: Secondary | ICD-10-CM | POA: Diagnosis not present

## 2019-10-03 DIAGNOSIS — I219 Acute myocardial infarction, unspecified: Secondary | ICD-10-CM | POA: Diagnosis not present

## 2019-10-03 DIAGNOSIS — D72819 Decreased white blood cell count, unspecified: Secondary | ICD-10-CM | POA: Diagnosis not present

## 2019-10-03 DIAGNOSIS — M109 Gout, unspecified: Secondary | ICD-10-CM | POA: Diagnosis not present

## 2019-10-19 ENCOUNTER — Other Ambulatory Visit: Payer: Self-pay | Admitting: Rheumatology

## 2019-10-24 NOTE — Progress Notes (Signed)
Virtual Visit via Video Note  I connected with Susan Daniels on 10/26/19 at 11:30 AM EST by a video enabled telemedicine application and verified that I am speaking with the correct person using two identifiers.  Location: Patient: Home  Provider: Clinic  This service was conducted via virtual visit.  Both audio and visual tools were used.  The patient was located at home. I was located in my office.  Consent was obtained prior to the virtual visit and is aware of possible charges through their insurance for this visit.  The patient is an established patient.  Dr. Estanislado Pandy, MD conducted the virtual visit and Hazel Sams, PA-C acted as scribe during the service.  Office staff helped with scheduling follow up visits after the service was conducted.   I discussed the limitations of evaluation and management by telemedicine and the availability of in person appointments. The patient expressed understanding and agreed to proceed.  CC: Medication monitoring  History of Present Illness: Patient is a 63 year old female with a past medical history of autoimmune disease, osteoarthritis, and gout.   She is taking plaquenil 200 mg 1 tablet by mouth twice daily. She denies any recent autoimmune disease flares.  She denies any joint pain or joint swelling currently.  She denies any gout flares since her last visit. She started on allopurinol 100 mg po daily after her office visit on 09/12/19.  She continues to take colchicine 0.6 mg 1 tablet by mouth daily.   Review of Systems  Constitutional: Negative for fever and malaise/fatigue.  Eyes: Negative for photophobia, pain, discharge and redness.  Respiratory: Negative for cough, shortness of breath and wheezing.   Cardiovascular: Negative for chest pain and palpitations.  Gastrointestinal: Negative for blood in stool, constipation and diarrhea.  Genitourinary: Negative for dysuria.  Musculoskeletal: Negative for back pain, joint pain, myalgias and neck  pain.  Skin: Negative for rash.  Neurological: Negative for dizziness and headaches.  Psychiatric/Behavioral: Negative for depression. The patient is not nervous/anxious and does not have insomnia.       Observations/Objective: Physical Exam  Constitutional: She is oriented to person, place, and time and well-developed, well-nourished, and in no distress.  HENT:  Head: Normocephalic and atraumatic.  Eyes: Conjunctivae are normal.  Pulmonary/Chest: Effort normal.  Neurological: She is alert and oriented to person, place, and time.  Psychiatric: Mood, memory, affect and judgment normal.   Patient reports morning stiffness for 0 minutes.   Patient denies nocturnal pain.  Difficulty dressing/grooming: Denies Difficulty climbing stairs: Denies Difficulty getting out of chair: Denies Difficulty using hands for taps, buttons, cutlery, and/or writing: Denies   Assessment and Plan: Visit Diagnoses: Autoimmune disease ([place]) - history of rash, arthralgias, positive ANA(ENA, C3-C4, anticardiolipin, beta-2, lupus anticoagulant was negative.  ANA was 1: 320 speckled), photosensitivity: She has not had any signs or symptoms of an autoimmune disease flare.  She is clinically doing well on Plaquenil 200 mg 1 tablet by mouth twice daily.  She has no joint pain or inflammation at this time.  No morning stiffness.  She has not had any recent rashes, oral or nasal ulcerations, raynaud's, sicca symptoms, fatigue, fevers, or enlarged lymph nodes. She will continue taking plaquenil as prescribed.  She does not need a refill at this time.  She will follow up in 4-5 months.   High risk medication use - Plaquenil 200 mg 1 tablet by mouth twice daily.  Last Plaquenil eye exam normal on 08/22/2019.  Orders for CBC  and CMP will be placed today.    Primary osteoarthritis of both hands: She is not having any joint pain or inflammation at this time. She is able to make a complete fist.  Joint protection and muscle  strengthening were discussed.   Primary osteoarthritis of both knees: She has no knee joint pain or inflammation at this time.   Primary osteoarthritis of both feet: She has no feet pain or inflammation currently.   Chronic idiopathic gout involving toe of right foot without tophus -She has not had any recent gout flares.  She is clinically doing well on allopurinol 100 mg 1 tablet daily and colchicine 0.6 mg 1 tablet daily.  She is tolerating both medications without any side effects.  She is due to update lab work.  Orders for CBC, CMP, and uric acid will be placed today. Ideally her uric acid should be less than 6, so we may need to adjust her dose of allopurinol.  Her uric acid was 7.2 on 09/12/19. She was advised to notify us if she develops signs of a gout flare.  Great toe pain, right - Resolved   Other medical conditions are listed as follows:  History of vitamin D deficiency  History of hypothyroidism  History of diabetes mellitus  Dyslipidemia  Elevated LFTs  Heart murmur  Follow Up Instructions: She will follow up in 4-5 months.   I discussed the assessment and treatment plan with the patient. The patient was provided an opportunity to ask questions and all were answered. The patient agreed with the plan and demonstrated an understanding of the instructions.   The patient was advised to call back or seek an in-person evaluation if the symptoms worsen or if the condition fails to improve as anticipated.  I provided 15 minutes of non-face-to-face time during this encounter.   Bo Merino, MD   Scribed by-  Hazel Sams, PA-C

## 2019-10-26 ENCOUNTER — Other Ambulatory Visit: Payer: Self-pay

## 2019-10-26 ENCOUNTER — Telehealth (INDEPENDENT_AMBULATORY_CARE_PROVIDER_SITE_OTHER): Payer: BC Managed Care – PPO | Admitting: Rheumatology

## 2019-10-26 ENCOUNTER — Encounter: Payer: Self-pay | Admitting: Rheumatology

## 2019-10-26 DIAGNOSIS — Z79899 Other long term (current) drug therapy: Secondary | ICD-10-CM

## 2019-10-26 DIAGNOSIS — E785 Hyperlipidemia, unspecified: Secondary | ICD-10-CM

## 2019-10-26 DIAGNOSIS — M359 Systemic involvement of connective tissue, unspecified: Secondary | ICD-10-CM | POA: Diagnosis not present

## 2019-10-26 DIAGNOSIS — M19071 Primary osteoarthritis, right ankle and foot: Secondary | ICD-10-CM

## 2019-10-26 DIAGNOSIS — M1A0710 Idiopathic chronic gout, right ankle and foot, without tophus (tophi): Secondary | ICD-10-CM

## 2019-10-26 DIAGNOSIS — M170 Bilateral primary osteoarthritis of knee: Secondary | ICD-10-CM | POA: Diagnosis not present

## 2019-10-26 DIAGNOSIS — R7989 Other specified abnormal findings of blood chemistry: Secondary | ICD-10-CM

## 2019-10-26 DIAGNOSIS — R011 Cardiac murmur, unspecified: Secondary | ICD-10-CM

## 2019-10-26 DIAGNOSIS — M79674 Pain in right toe(s): Secondary | ICD-10-CM

## 2019-10-26 DIAGNOSIS — M19042 Primary osteoarthritis, left hand: Secondary | ICD-10-CM

## 2019-10-26 DIAGNOSIS — Z8639 Personal history of other endocrine, nutritional and metabolic disease: Secondary | ICD-10-CM

## 2019-10-26 DIAGNOSIS — M19072 Primary osteoarthritis, left ankle and foot: Secondary | ICD-10-CM

## 2019-10-26 DIAGNOSIS — M19041 Primary osteoarthritis, right hand: Secondary | ICD-10-CM | POA: Diagnosis not present

## 2019-11-23 ENCOUNTER — Other Ambulatory Visit: Payer: Self-pay | Admitting: *Deleted

## 2019-11-23 DIAGNOSIS — Z79899 Other long term (current) drug therapy: Secondary | ICD-10-CM | POA: Diagnosis not present

## 2019-11-23 DIAGNOSIS — M1A0710 Idiopathic chronic gout, right ankle and foot, without tophus (tophi): Secondary | ICD-10-CM | POA: Diagnosis not present

## 2019-11-24 LAB — COMPLETE METABOLIC PANEL WITH GFR
AG Ratio: 1.7 (calc) (ref 1.0–2.5)
ALT: 17 U/L (ref 6–29)
AST: 16 U/L (ref 10–35)
Albumin: 4.4 g/dL (ref 3.6–5.1)
Alkaline phosphatase (APISO): 59 U/L (ref 37–153)
BUN: 21 mg/dL (ref 7–25)
CO2: 32 mmol/L (ref 20–32)
Calcium: 9.7 mg/dL (ref 8.6–10.4)
Chloride: 102 mmol/L (ref 98–110)
Creat: 0.98 mg/dL (ref 0.50–0.99)
GFR, Est African American: 71 mL/min/{1.73_m2} (ref >=60)
GFR, Est Non African American: 61 mL/min/{1.73_m2} (ref >=60)
Globulin: 2.6 g/dL (calc) (ref 1.9–3.7)
Glucose, Bld: 111 mg/dL (ref 65–139)
Potassium: 4.1 mmol/L (ref 3.5–5.3)
Sodium: 141 mmol/L (ref 135–146)
Total Bilirubin: 0.7 mg/dL (ref 0.2–1.2)
Total Protein: 7 g/dL (ref 6.1–8.1)

## 2019-11-24 LAB — CBC WITH DIFFERENTIAL/PLATELET
Absolute Monocytes: 568 cells/uL (ref 200–950)
Basophils Absolute: 81 cells/uL (ref 0–200)
Basophils Relative: 1.4 %
Eosinophils Absolute: 191 cells/uL (ref 15–500)
Eosinophils Relative: 3.3 %
HCT: 38.4 % (ref 35.0–45.0)
Hemoglobin: 12.8 g/dL (ref 11.7–15.5)
Lymphs Abs: 1578 cells/uL (ref 850–3900)
MCH: 29.1 pg (ref 27.0–33.0)
MCHC: 33.3 g/dL (ref 32.0–36.0)
MCV: 87.3 fL (ref 80.0–100.0)
MPV: 12.2 fL (ref 7.5–12.5)
Monocytes Relative: 9.8 %
Neutro Abs: 3381 cells/uL (ref 1500–7800)
Neutrophils Relative %: 58.3 %
Platelets: 192 10*3/uL (ref 140–400)
RBC: 4.4 10*6/uL (ref 3.80–5.10)
RDW: 13.3 % (ref 11.0–15.0)
Total Lymphocyte: 27.2 %
WBC: 5.8 10*3/uL (ref 3.8–10.8)

## 2019-11-24 LAB — URIC ACID: Uric Acid, Serum: 5.9 mg/dL (ref 2.5–7.0)

## 2019-11-26 NOTE — Progress Notes (Signed)
CBC and CMP WNL.  Uric acid is within the desirable range.

## 2019-11-30 ENCOUNTER — Other Ambulatory Visit: Payer: Self-pay | Admitting: Rheumatology

## 2019-11-30 NOTE — Telephone Encounter (Signed)
Last Visit: 10/26/19 Next Visit: 03/19/20 Labs: 11/23/19 CBC and CMP WNL. Uric acid is within the desirable range.   Okay to refill per Dr. Estanislado Pandy

## 2019-12-04 ENCOUNTER — Other Ambulatory Visit: Payer: Self-pay | Admitting: Physician Assistant

## 2019-12-04 NOTE — Telephone Encounter (Signed)
Last Visit: 09/12/19 Next Visit: 03/19/20 Labs: 11/23/19 CBC and CMP WNL. Uric acid is within the desirable range  Okay to refill per Dr. Estanislado Pandy

## 2019-12-06 NOTE — Telephone Encounter (Signed)
CBC and CMP were normal.  Uric acid was less than 6.  Colcrys will be needed only for as needed use if she has a flare otherwise she will not needed.  She can keep one prescription at home in case she has a flare otherwise she will have to get it when she has a flare.

## 2019-12-29 ENCOUNTER — Other Ambulatory Visit: Payer: Self-pay | Admitting: Rheumatology

## 2019-12-31 NOTE — Telephone Encounter (Signed)
Last Visit: 09/12/19 Next Visit: 03/19/20 Labs: 11/23/19 CBC and CMP WNL. Uric acid is within the desirable range  Okay to refill per Dr. Estanislado Pandy

## 2020-01-09 ENCOUNTER — Other Ambulatory Visit: Payer: Self-pay | Admitting: Rheumatology

## 2020-01-09 NOTE — Telephone Encounter (Signed)
Last Visit:09/12/19 Next Visit:03/19/20 Labs:1/15/21CBC and CMP WNL. Uric acid is within the desirable range  Okay to refill per Dr. Estanislado Pandy

## 2020-01-10 IMAGING — US ULTRASOUND ABDOMEN LIMITED
1 series · 14 of 25 positions shown · non-contrast
Comparison: None.

CLINICAL DATA: Elevated LFT

EXAM:
ULTRASOUND ABDOMEN LIMITED RIGHT UPPER QUADRANT

[Series 1: ultrasound abdomen limited · 14 of 46 slices shown]
[im 1/46]
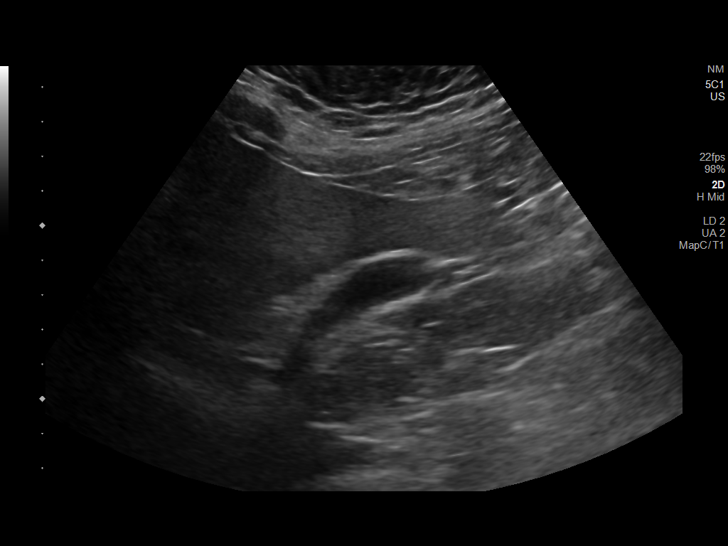
[im 4/46]
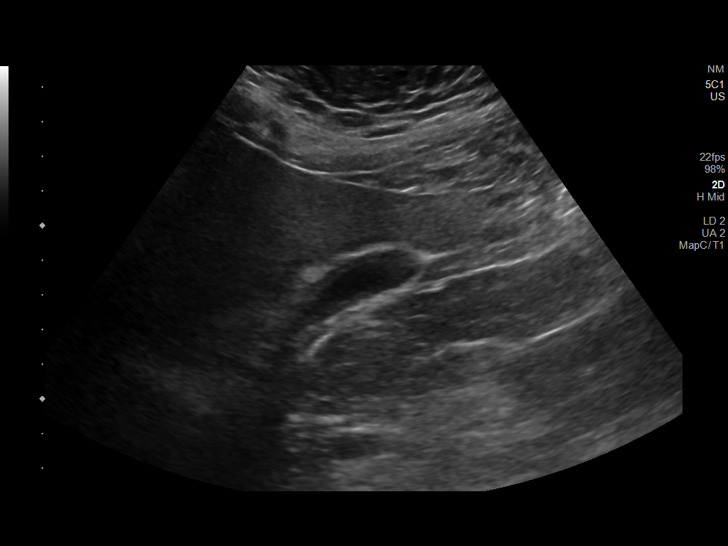
[im 8/46]
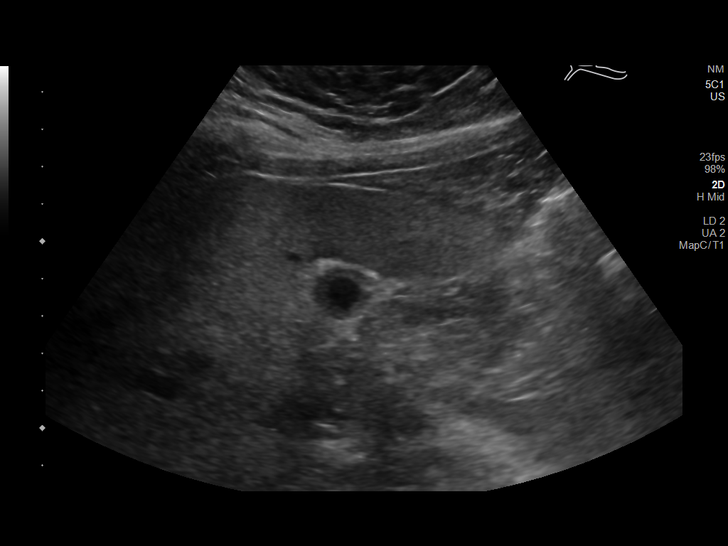
[im 12/46]
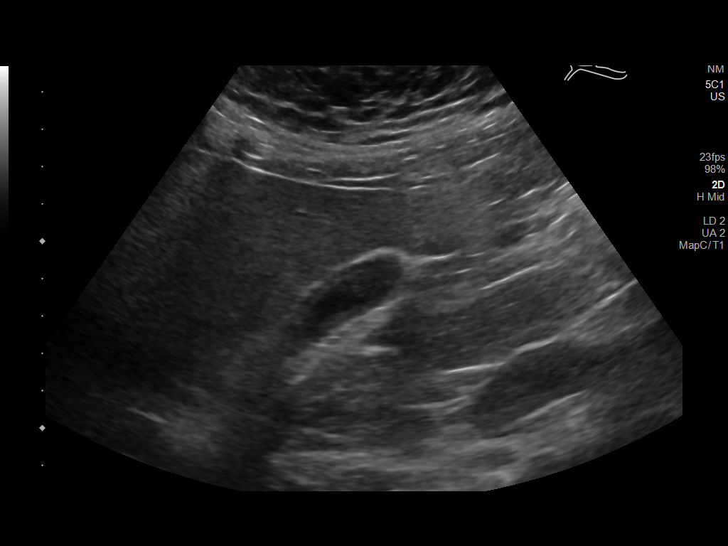
[im 16/46]
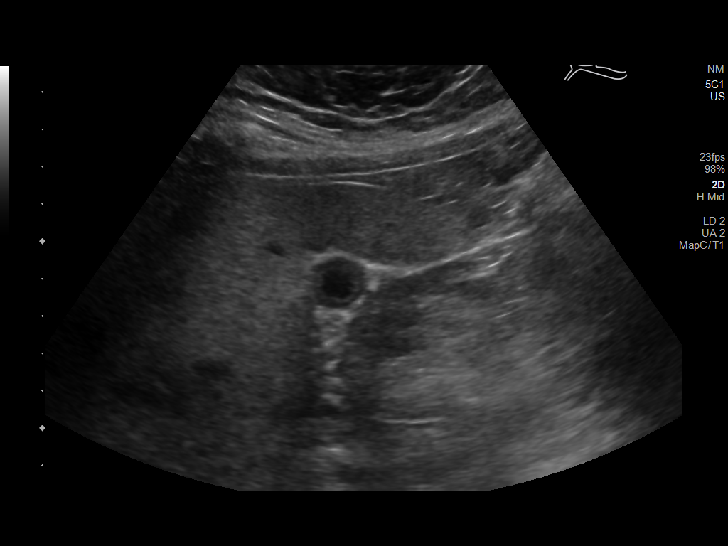
[im 17/46]
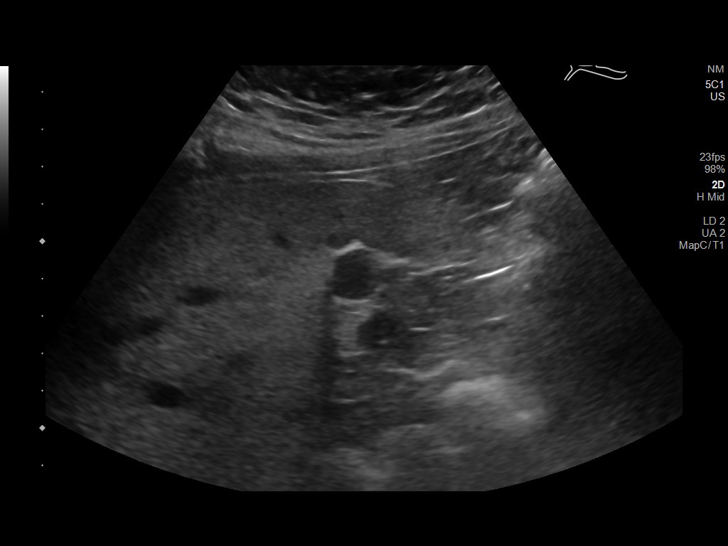
[im 21/46]
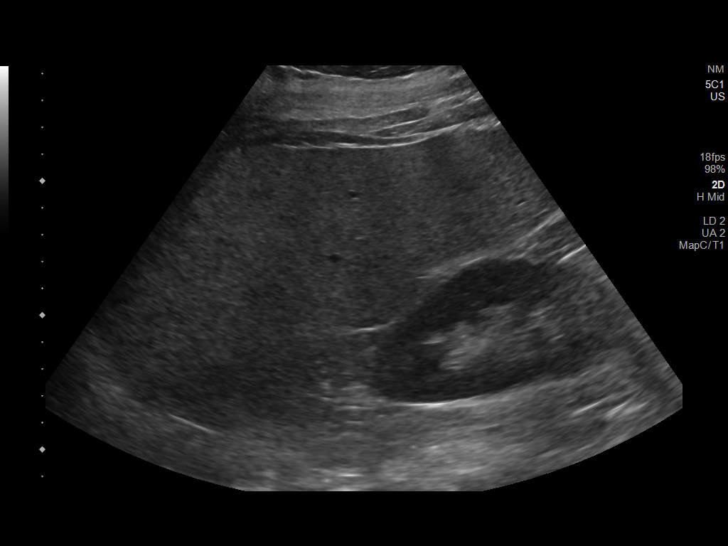
[im 25/46]
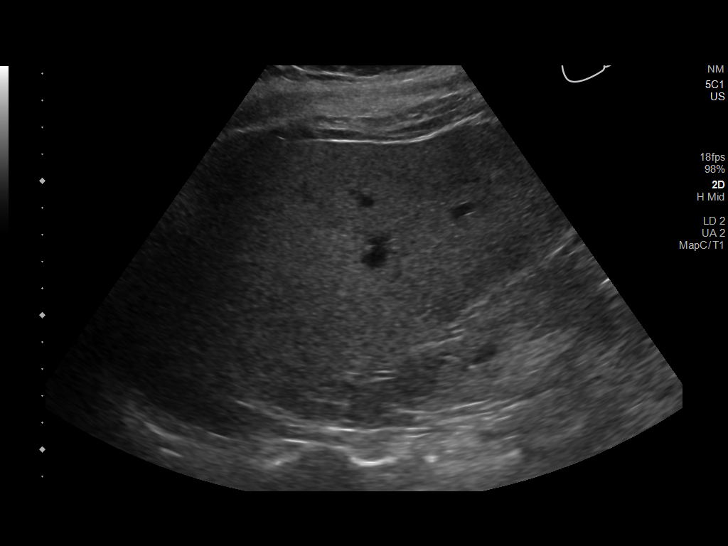
[im 29/46]
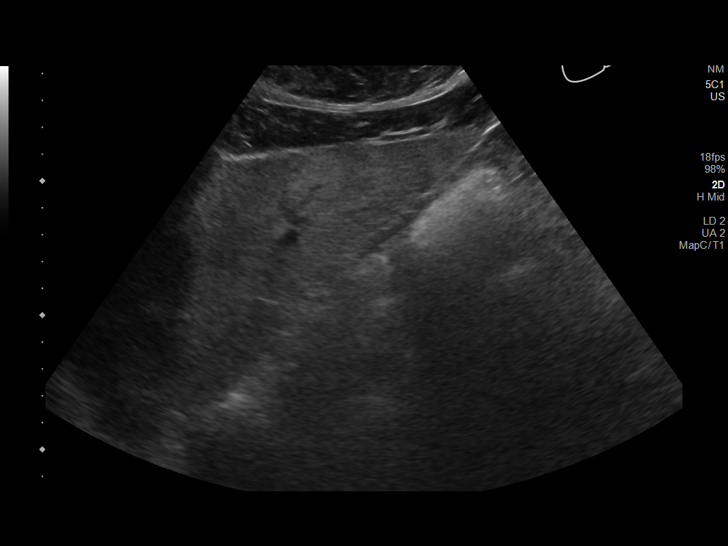
[im 31/46]
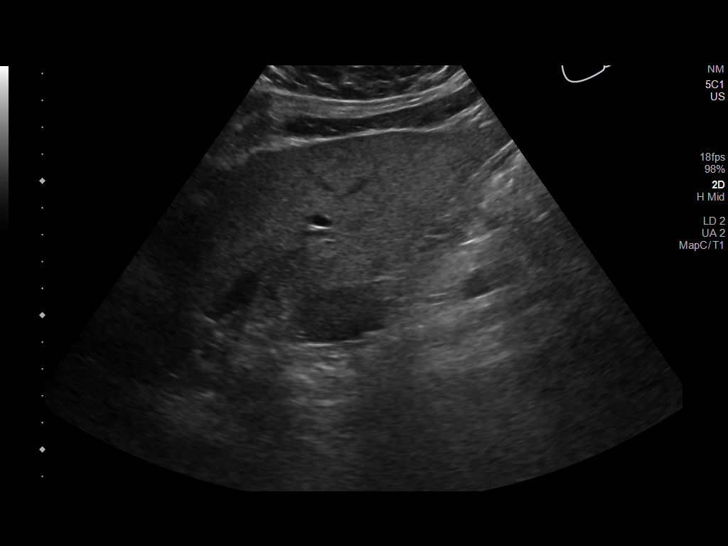
[im 34/46]
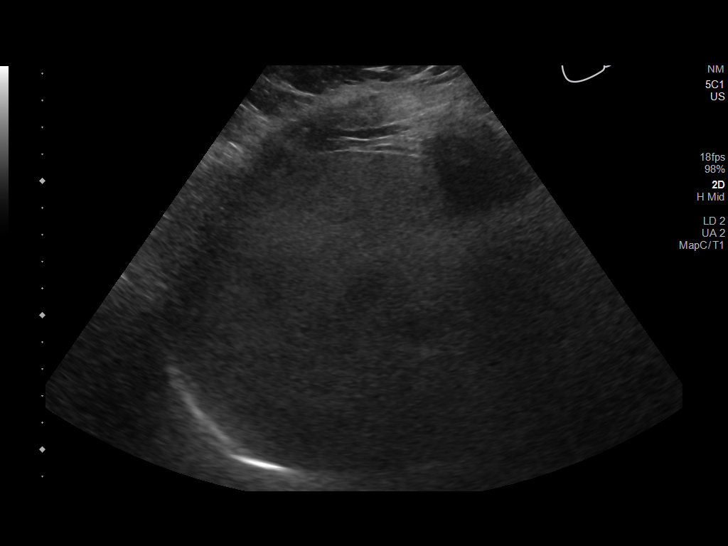
[im 38/46]
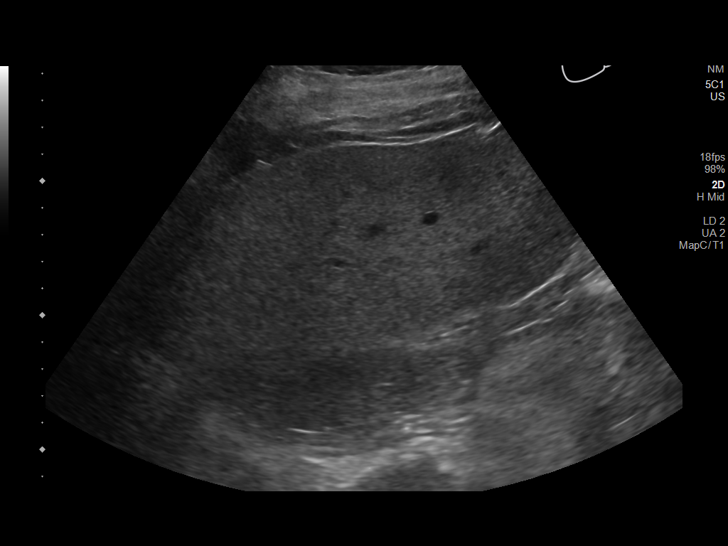
[im 42/46]
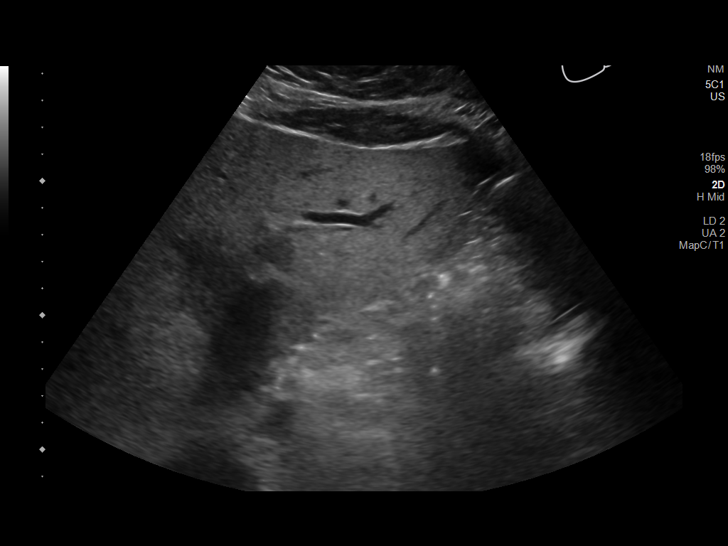
[im 46/46]
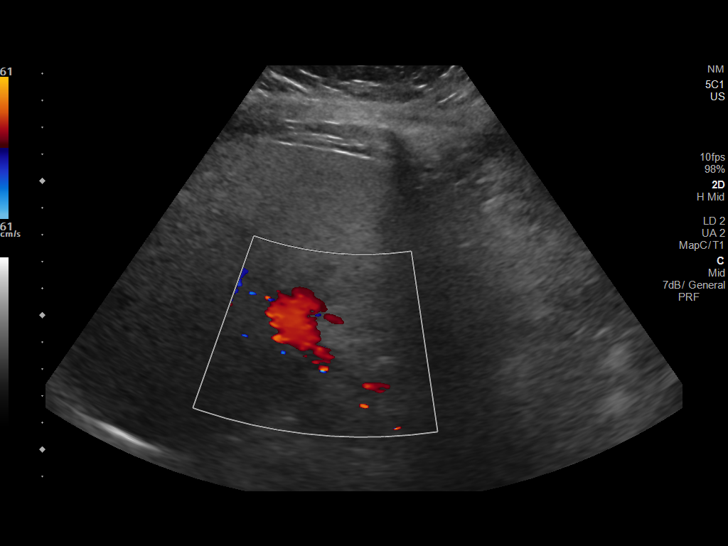

[14 of 25 positions shown; findings below may reference images not displayed]

FINDINGS: Gallbladder:

Contracted gallbladder. Upper normal wall thickness. No shadowing
stone. Negative sonographic Murphy

Common bile duct:

Diameter: 4.3 mm

Liver:

Increased hepatic echogenicity without focal abnormality. Portal
vein is patent on color Doppler imaging with normal direction of
blood flow towards the liver.
IMPRESSION: 1. Contracted gallbladder. Negative for stones or biliary dilatation
2. Echogenic liver consistent with steatosis and or hepatocellular
disease

## 2020-01-22 DIAGNOSIS — I2510 Atherosclerotic heart disease of native coronary artery without angina pectoris: Secondary | ICD-10-CM | POA: Diagnosis not present

## 2020-01-22 DIAGNOSIS — I10 Essential (primary) hypertension: Secondary | ICD-10-CM | POA: Diagnosis not present

## 2020-01-30 DIAGNOSIS — E119 Type 2 diabetes mellitus without complications: Secondary | ICD-10-CM | POA: Diagnosis not present

## 2020-02-28 ENCOUNTER — Other Ambulatory Visit: Payer: Self-pay | Admitting: Rheumatology

## 2020-02-28 NOTE — Telephone Encounter (Signed)
Last Visit: 10/26/2019 telemedicine  Next Visit: 03/19/2020 Labs: 11/23/2019 CBC and CMP WNL. Uric acid is within the desirable range.   Okay to refill per Dr. Estanislado Pandy.

## 2020-03-12 NOTE — Progress Notes (Signed)
Office Visit Note  Patient: Susan Daniels             Date of Birth: Sep 25, 1956           MRN: [id number]             PCP: Josetta Huddle, MD Referring: Josetta Huddle, MD Visit Date: 03/19/2020 Occupation: @GUAROCC @  Subjective:  Medication monitoring.   History of Present Illness: Susan Daniels is a 64 y.o. female with history of autoimmune disease, osteoarthritis and gout.  She states she has not had any joint inflammation.  She has occasional discomfort in her right knee joint.  She went to the beach recently and developed rash and hives.  She states gradually the rash improved.  She denies any history of oral ulcers, nasal ulcers, Raynaud's phenomenon.  She states the osteoarthritis pain is manageable.  She has not had any gout flare.  She had good response to Visco supplement injections and is pain free as regards to her knee joints.  Activities of Daily Living:  Patient reports morning stiffness for 0 minutes.   Patient Denies nocturnal pain.  Difficulty dressing/grooming: Denies Difficulty climbing stairs: Denies Difficulty getting out of chair: Denies Difficulty using hands for taps, buttons, cutlery, and/or writing: Denies  Review of Systems  Constitutional: Negative for fatigue.  HENT: Negative for mouth sores, mouth dryness and nose dryness.   Eyes: Negative for itching and dryness.  Respiratory: Negative for shortness of breath and difficulty breathing.   Cardiovascular: Negative for chest pain and palpitations.  Gastrointestinal: Negative for blood in stool, constipation and diarrhea.  Endocrine: Negative for increased urination.  Genitourinary: Negative for difficulty urinating and painful urination.  Musculoskeletal: Positive for arthralgias, joint pain and joint swelling. Negative for myalgias, morning stiffness, muscle tenderness and myalgias.  Skin: Negative for rash, hair loss and redness.  Allergic/Immunologic: Negative for susceptible to infections.    Neurological: Negative for dizziness, numbness, headaches, memory loss and weakness.  Hematological: Positive for bruising/bleeding tendency.  Psychiatric/Behavioral: Negative for confusion and sleep disturbance.    PMFS History:  Patient Active Problem List   Diagnosis Date Noted  . Autoimmune disease ([place]) 06/03/2017  . High risk medication use 06/03/2017  . Primary osteoarthritis of both hands 06/03/2017  . Primary osteoarthritis of both knees 06/03/2017  . Primary osteoarthritis of both feet 06/03/2017  . Heart murmur 06/03/2017  . History of hypothyroidism 06/03/2017  . Dyslipidemia 06/03/2017  . History of diabetes mellitus 06/03/2017  . Vitamin D deficiency 06/03/2017    Past Medical History:  Diagnosis Date  . Arthritis    Rheumatoid  . Diabetes mellitus without complication ([place])   . Headache(784.0)    migraines  . Heart murmur     Family History  Problem Relation Age of Onset  . Healthy Mother   . Heart disease Father   . Rheumatic fever Father   . Hypertension Sister   . Rheumatic fever Sister   . Healthy Son   . Healthy Daughter    Past Surgical History:  Procedure Laterality Date  . COLONOSCOPY WITH PROPOFOL N/A 01/30/2013   Procedure: COLONOSCOPY WITH PROPOFOL;  Surgeon: Garlan Fair, MD;  Location: [facility];  Service: Endoscopy;  Laterality: N/A;  . dental implant  09/29/2018  . HEART STENT    . TUBAL LIGATION     Social History   Social History Narrative  . Not on file   Immunization History  Administered Date(s) Administered  . PFIZER SARS-COV-2 Vaccination  01/29/2020, 02/19/2020     Objective: Vital Signs: BP 135/78 (BP Location: Left Arm, Patient Position: Sitting, Cuff Size: Small)   Pulse 67   Resp 12   Ht 5\' 2"  (1.575 m)   Wt 204 lb 6.4 oz (92.7 kg)   BMI 37.39 kg/m    Physical Exam Vitals and nursing note reviewed.  Constitutional:      Appearance: She is well-developed.  HENT:     Head: Normocephalic and  atraumatic.  Eyes:     Conjunctiva/sclera: Conjunctivae normal.  Cardiovascular:     Rate and Rhythm: Normal rate and regular rhythm.     Heart sounds: Murmur present.  Pulmonary:     Effort: Pulmonary effort is normal.     Breath sounds: Normal breath sounds.  Abdominal:     General: Bowel sounds are normal.     Palpations: Abdomen is soft.  Musculoskeletal:     Cervical back: Normal range of motion.  Lymphadenopathy:     Cervical: No cervical adenopathy.  Skin:    General: Skin is warm and dry.     Capillary Refill: Capillary refill takes less than 2 seconds.  Neurological:     Mental Status: She is alert and oriented to person, place, and time.  Psychiatric:        Behavior: Behavior normal.      Musculoskeletal Exam: C-spine was in good range of motion.  Shoulder joints, elbow joints, wrist joints with good range of motion.  She has DIP and PIP thickening but no synovitis was noted.  Hip joints and knee joints are in good range of motion.  She has no tenderness over ankles or her feet.  CDAI Exam: CDAI Score: -- Patient Global: --; Provider Global: -- Swollen: --; Tender: -- Joint Exam 03/19/2020   No joint exam has been documented for this visit   There is currently no information documented on the homunculus. Go to the Rheumatology activity and complete the homunculus joint exam.  Investigation: No additional findings.  Imaging: No results found.  Recent Labs: Lab Results  Component Value Date   WBC 5.8 11/23/2019   HGB 12.8 11/23/2019   PLT 192 11/23/2019   NA 141 11/23/2019   K 4.1 11/23/2019   CL 102 11/23/2019   CO2 32 11/23/2019   GLUCOSE 111 11/23/2019   BUN 21 11/23/2019   CREATININE 0.98 11/23/2019   BILITOT 0.7 11/23/2019   ALKPHOS 180 (H) 05/18/2019   AST 16 11/23/2019   ALT 17 11/23/2019   PROT 7.0 11/23/2019   ALBUMIN 3.8 05/18/2019   CALCIUM 9.7 11/23/2019   GFRAA 71 11/23/2019    Speciality Comments: PLQ eye exam: 08/22/19 Normal.  [facility].   Procedures:  No procedures performed Allergies: Aspirin, Epinephrine, and Hydrocodone   Assessment / Plan:     Visit Diagnoses: Autoimmune disease ([place]) - history of rash, arthralgias, positive ANA(ENA, C3-C4, anticardiolipin, beta-2, lupus anticoagulant was negative.  ANA was 1: 320 speckled), photosensitivity -patient is clinically doing well.  She had recent episode of photosensitivity.  Use of sunscreen was emphasized.  We will check autoimmune labs today.  Plan: Urinalysis, Routine w reflex microscopic, ANA, Anti-DNA antibody, double-stranded, C3 and C4, Sedimentation rate  High risk medication use - Plaquenil 200 mg 1 tablet by mouth twice daily.  As she is doing well advised to reduce her Plaquenil to 200 mg 1 tablet p.o. twice daily Monday through Friday..Last Plaquenil eye exam normal on 08/22/2019.  Patient states she had  another eye examination and March 2021.  We do not have the report.  She will have it forwarded to [place].- Plan: CBC with Differential/Platelet, COMPLETE METABOLIC PANEL WITH GFR  Primary osteoarthritis of both hands-joint protection muscle strengthening was discussed.  Primary osteoarthritis of both knees-she had viscosupplementation's in February 2020.  She had good response to the injections.  Primary osteoarthritis of both feet-proper fitting shoes were discussed.  Chronic idiopathic gout involving toe of right foot without tophus - allopurinol 100 mg 1 tablet daily and colchicine 0.6 mg 1 tablet daily.  She has not taken colchicine in a long time.  She denies any recent gout flares.  Uric acid: 11/23/2019 5.9 - Plan: Uric acid  Other medical problems are listed as follows:  History of hypothyroidism  History of vitamin D deficiency  Dyslipidemia  History of diabetes mellitus  Heart murmur  Postmenopausal - Plan: DG BONE DENSITY (DXA)  Orders: Orders Placed This Encounter  Procedures  . DG BONE DENSITY (DXA)  . CBC with  Differential/Platelet  . COMPLETE METABOLIC PANEL WITH GFR  . Urinalysis, Routine w reflex microscopic  . ANA  . Anti-DNA antibody, double-stranded  . C3 and C4  . Sedimentation rate  . Uric acid   Meds ordered this encounter  Medications  . hydroxychloroquine (PLAQUENIL) 200 MG tablet    Sig: Take 1 tablet by mouth twice daily, Monday through Friday only.    Dispense:  120 tablet    Refill:  0    .  Follow-Up Instructions: Return in about 5 months (around 08/19/2020) for Autoimmune disease, Osteoarthritis.   Bo Merino, MD  Note - This record has been created using Editor, commissioning.  Chart creation errors have been sought, but may not always  have been located. Such creation errors do not reflect on  the standard of medical care.

## 2020-03-19 ENCOUNTER — Encounter: Payer: Self-pay | Admitting: Rheumatology

## 2020-03-19 ENCOUNTER — Other Ambulatory Visit: Payer: Self-pay

## 2020-03-19 ENCOUNTER — Ambulatory Visit: Payer: BC Managed Care – PPO | Admitting: Rheumatology

## 2020-03-19 VITALS — BP 135/78 | HR 67 | Resp 12 | Ht 62.0 in | Wt 204.4 lb

## 2020-03-19 DIAGNOSIS — M19041 Primary osteoarthritis, right hand: Secondary | ICD-10-CM

## 2020-03-19 DIAGNOSIS — M19071 Primary osteoarthritis, right ankle and foot: Secondary | ICD-10-CM

## 2020-03-19 DIAGNOSIS — R011 Cardiac murmur, unspecified: Secondary | ICD-10-CM

## 2020-03-19 DIAGNOSIS — M19042 Primary osteoarthritis, left hand: Secondary | ICD-10-CM

## 2020-03-19 DIAGNOSIS — Z780 Asymptomatic menopausal state: Secondary | ICD-10-CM

## 2020-03-19 DIAGNOSIS — M359 Systemic involvement of connective tissue, unspecified: Secondary | ICD-10-CM | POA: Diagnosis not present

## 2020-03-19 DIAGNOSIS — M1A0710 Idiopathic chronic gout, right ankle and foot, without tophus (tophi): Secondary | ICD-10-CM | POA: Diagnosis not present

## 2020-03-19 DIAGNOSIS — Z79899 Other long term (current) drug therapy: Secondary | ICD-10-CM | POA: Diagnosis not present

## 2020-03-19 DIAGNOSIS — M19072 Primary osteoarthritis, left ankle and foot: Secondary | ICD-10-CM

## 2020-03-19 DIAGNOSIS — M170 Bilateral primary osteoarthritis of knee: Secondary | ICD-10-CM

## 2020-03-19 DIAGNOSIS — M79674 Pain in right toe(s): Secondary | ICD-10-CM

## 2020-03-19 DIAGNOSIS — E785 Hyperlipidemia, unspecified: Secondary | ICD-10-CM

## 2020-03-19 DIAGNOSIS — Z8639 Personal history of other endocrine, nutritional and metabolic disease: Secondary | ICD-10-CM

## 2020-03-19 MED ORDER — HYDROXYCHLOROQUINE SULFATE 200 MG PO TABS: ORAL_TABLET | ORAL | 0 refills | Status: DC

## 2020-03-21 LAB — COMPLETE METABOLIC PANEL WITH GFR
AG Ratio: 1.9 (calc) (ref 1.0–2.5)
ALT: 17 U/L (ref 6–29)
AST: 15 U/L (ref 10–35)
Albumin: 4.4 g/dL (ref 3.6–5.1)
Alkaline phosphatase (APISO): 58 U/L (ref 37–153)
BUN: 21 mg/dL (ref 7–25)
CO2: 29 mmol/L (ref 20–32)
Calcium: 9.6 mg/dL (ref 8.6–10.4)
Chloride: 104 mmol/L (ref 98–110)
Creat: 0.99 mg/dL (ref 0.50–0.99)
GFR, Est African American: 70 mL/min/{1.73_m2} (ref >=60)
GFR, Est Non African American: 60 mL/min/{1.73_m2} (ref >=60)
Globulin: 2.3 g/dL (calc) (ref 1.9–3.7)
Glucose, Bld: 145 mg/dL — ABNORMAL HIGH (ref 65–99)
Potassium: 4.5 mmol/L (ref 3.5–5.3)
Sodium: 140 mmol/L (ref 135–146)
Total Bilirubin: 0.7 mg/dL (ref 0.2–1.2)
Total Protein: 6.7 g/dL (ref 6.1–8.1)

## 2020-03-21 LAB — CBC WITH DIFFERENTIAL/PLATELET
Absolute Monocytes: 513 cells/uL (ref 200–950)
Basophils Absolute: 70 cells/uL (ref 0–200)
Basophils Relative: 1.3 %
Eosinophils Absolute: 340 cells/uL (ref 15–500)
Eosinophils Relative: 6.3 %
HCT: 38.8 % (ref 35.0–45.0)
Hemoglobin: 12.7 g/dL (ref 11.7–15.5)
Lymphs Abs: 1312 cells/uL (ref 850–3900)
MCH: 30 pg (ref 27.0–33.0)
MCHC: 32.7 g/dL (ref 32.0–36.0)
MCV: 91.5 fL (ref 80.0–100.0)
MPV: 11.6 fL (ref 7.5–12.5)
Monocytes Relative: 9.5 %
Neutro Abs: 3164 cells/uL (ref 1500–7800)
Neutrophils Relative %: 58.6 %
Platelets: 205 10*3/uL (ref 140–400)
RBC: 4.24 10*6/uL (ref 3.80–5.10)
RDW: 12.8 % (ref 11.0–15.0)
Total Lymphocyte: 24.3 %
WBC: 5.4 10*3/uL (ref 3.8–10.8)

## 2020-03-21 LAB — ANTI-DNA ANTIBODY, DOUBLE-STRANDED: ds DNA Ab: <1 IU/mL

## 2020-03-21 LAB — URINALYSIS, ROUTINE W REFLEX MICROSCOPIC
Bacteria, UA: NONE SEEN /HPF
Bilirubin Urine: NEGATIVE
Glucose, UA: NEGATIVE
Hgb urine dipstick: NEGATIVE
Hyaline Cast: NONE SEEN /LPF
Ketones, ur: NEGATIVE
Nitrite: NEGATIVE
Protein, ur: NEGATIVE
RBC / HPF: NONE SEEN /HPF (ref 0–2)
Specific Gravity, Urine: 1.013 (ref 1.001–1.03)
Squamous Epithelial / HPF: NONE SEEN /HPF (ref <=5)
pH: <=5 (ref 5.0–8.0)

## 2020-03-21 LAB — URIC ACID: Uric Acid, Serum: 6.9 mg/dL (ref 2.5–7.0)

## 2020-03-21 LAB — C3 AND C4
C3 Complement: 147 mg/dL (ref 83–193)
C4 Complement: 28 mg/dL (ref 15–57)

## 2020-03-21 LAB — ANTI-NUCLEAR AB-TITER (ANA TITER): ANA Titer 1: 1:640 {titer} — ABNORMAL HIGH

## 2020-03-21 LAB — ANA: Anti Nuclear Antibody (ANA): POSITIVE — AB

## 2020-03-21 LAB — SEDIMENTATION RATE: Sed Rate: 11 mm/h (ref 0–30)

## 2020-03-23 NOTE — Progress Notes (Signed)
Glucose is elevated, probably not fasting.  ANA is positive.  Labs do not indicate active autoimmune disease.  Uric acid is mildly.  No change in treatment advised.

## 2020-04-01 DIAGNOSIS — M109 Gout, unspecified: Secondary | ICD-10-CM | POA: Diagnosis not present

## 2020-04-01 DIAGNOSIS — Z0000 Encounter for general adult medical examination without abnormal findings: Secondary | ICD-10-CM | POA: Diagnosis not present

## 2020-04-01 DIAGNOSIS — E119 Type 2 diabetes mellitus without complications: Secondary | ICD-10-CM | POA: Diagnosis not present

## 2020-04-01 DIAGNOSIS — E039 Hypothyroidism, unspecified: Secondary | ICD-10-CM | POA: Diagnosis not present

## 2020-04-01 DIAGNOSIS — E559 Vitamin D deficiency, unspecified: Secondary | ICD-10-CM | POA: Diagnosis not present

## 2020-04-01 DIAGNOSIS — I219 Acute myocardial infarction, unspecified: Secondary | ICD-10-CM | POA: Diagnosis not present

## 2020-04-01 DIAGNOSIS — M359 Systemic involvement of connective tissue, unspecified: Secondary | ICD-10-CM | POA: Diagnosis not present

## 2020-04-02 ENCOUNTER — Encounter: Payer: Self-pay | Admitting: Rheumatology

## 2020-04-02 DIAGNOSIS — Z780 Asymptomatic menopausal state: Secondary | ICD-10-CM | POA: Diagnosis not present

## 2020-04-02 DIAGNOSIS — M85852 Other specified disorders of bone density and structure, left thigh: Secondary | ICD-10-CM | POA: Diagnosis not present

## 2020-04-02 DIAGNOSIS — M85851 Other specified disorders of bone density and structure, right thigh: Secondary | ICD-10-CM | POA: Diagnosis not present

## 2020-04-03 ENCOUNTER — Telehealth: Payer: Self-pay | Admitting: *Deleted

## 2020-04-03 NOTE — Telephone Encounter (Signed)
Received DEXA results from [facility].  Date of Scan: 04/02/2020 Lowest T-score and site measured: -1.4 Significant changes in BMD and site measured (5% and above): n/a  Current Regimen: n/a  Recommendation: Calcium, Vitamin D and resistive exercises.   Attempted to contact the patient and left message for patient to call the office.

## 2020-04-11 DIAGNOSIS — E119 Type 2 diabetes mellitus without complications: Secondary | ICD-10-CM | POA: Diagnosis not present

## 2020-05-24 ENCOUNTER — Other Ambulatory Visit: Payer: Self-pay | Admitting: Rheumatology

## 2020-05-26 NOTE — Telephone Encounter (Signed)
Last Visit: 03/19/2020 °Next Visit: 08/21/2020 °Labs: 03/19/2020 Glucose is elevated, Uric acid is mildly.  No change in treatment advised. °  °Current Dose per office note 03/19/2020: allopurinol 100 mg 1 tablet daily  °DX: Chronic idiopathic gout involving toe of right foot without tophus  °  °Okay to refill per Dr. Deveshwar  °

## 2020-06-16 ENCOUNTER — Other Ambulatory Visit: Payer: Self-pay

## 2020-06-16 ENCOUNTER — Ambulatory Visit: Payer: BC Managed Care – PPO | Admitting: Cardiovascular Disease

## 2020-06-16 ENCOUNTER — Encounter: Payer: Self-pay | Admitting: Cardiovascular Disease

## 2020-06-16 VITALS — BP 138/72 | HR 60 | Ht 62.0 in | Wt 208.0 lb

## 2020-06-16 DIAGNOSIS — E118 Type 2 diabetes mellitus with unspecified complications: Secondary | ICD-10-CM | POA: Diagnosis not present

## 2020-06-16 DIAGNOSIS — E785 Hyperlipidemia, unspecified: Secondary | ICD-10-CM

## 2020-06-16 DIAGNOSIS — I2510 Atherosclerotic heart disease of native coronary artery without angina pectoris: Secondary | ICD-10-CM | POA: Insufficient documentation

## 2020-06-16 DIAGNOSIS — I25118 Atherosclerotic heart disease of native coronary artery with other forms of angina pectoris: Secondary | ICD-10-CM

## 2020-06-16 NOTE — Progress Notes (Signed)
Cardiology Office Note  Date:  06/16/2020   ID:  [name], [name] 09/06/1956, MRN [id number]  PCP:  Josetta Huddle, MD   Chief Complaint  Patient presents with  . New Patient (Initial Visit)    Referred by pcp for Hx of MI. Meds reviewed verbally with patient.     HPI:  [name] is a 64 year old woman with past medical history of Coronary artery disease, non-STEMI July 2020, DES to RCA Rheumatoid arthritis Diabetes type 2 with complications Hypertension Hyperlipidemia Presenting to establish care in the [facility] office, new patient visit who presents by referral from Dr. Inda Merlin for consultation of her coronary disease, prior stenting  Anginal sx 05/24/2019 Leading to catheterization and stenting Denies having significant anginal symptoms on today's visit  Details of her prior cardiac history reviewed as below 05/24/2019:   [place] 05/24/2019: NSTEMI; DES to 100% mRCA; nonobstructive and branch disease in other vessels; normal EF  [place] 05/24/2019: EF 60-65%, indeterminate diastolic function, no significant valve disease  Lab work reviewed  HAB1C: in 05/2019 was 10.2 3 months ago: "was still high" "8 or 9"  Lab work from primary care has been requested  Cough 2-3 weeks Has discussed with primary care  EKG personally reviewed by myself on todays visit NSR rate 60 bpm, no ST or T wave changes   PMH:   has a past medical history of Arthritis, Diabetes mellitus without complication ([place]), JJKKXFGH(829.9), and Heart murmur.  PSH:    Past Surgical History:  Procedure Laterality Date  . COLONOSCOPY WITH PROPOFOL N/A 01/30/2013   Procedure: COLONOSCOPY WITH PROPOFOL;  Surgeon: Garlan Fair, MD;  Location: [facility];  Service: Endoscopy;  Laterality: N/A;  . dental implant  09/29/2018  . HEART STENT    . TUBAL LIGATION      Current Outpatient Medications  Medication Sig Dispense Refill  . allopurinol (ZYLOPRIM) 100 MG tablet TAKE 1 TABLET BY MOUTH EVERY DAY  90 tablet 0  . aspirin 81 MG chewable tablet Chew 81 mg by mouth daily.     . B-D ULTRA-FINE 33 LANCETS MISC Use as directed to test blood glucose    . Blood Glucose Monitoring Suppl (GLUCOCOM BLOOD GLUCOSE MONITOR) DEVI Use for glucose testing as directed    . Cholecalciferol (VITAMIN D PO) Take by mouth daily.    . fenoprofen (NALFON) 600 MG TABS tablet Take 600 mg by mouth.    Marland Kitchen glipiZIDE (GLUCOTROL XL) 5 MG 24 hr tablet Take 5 mg by mouth daily.    . hydroxychloroquine (PLAQUENIL) 200 MG tablet Take 1 tablet by mouth twice daily, Monday through Friday only. 120 tablet 0  . lisinopril (PRINIVIL,ZESTRIL) 2.5 MG tablet lisinopril 2.5 mg tablet    . metFORMIN (GLUCOPHAGE-XR) 500 MG 24 hr tablet Take 500 mg by mouth in the morning and at bedtime.     . metoprolol tartrate (LOPRESSOR) 25 MG tablet TAKA 1/2 TABLET BY MOUTH TWICE A DAY    . OneTouch Delica Lancets 37J MISC CHECK BLOOD SUGAR TWICE A DAY DX E11.65 FINGERSTICK 90 DAYS    . ONETOUCH VERIO test strip CHECK BLOOD SUGAR TWICE A DAY PRIOR TO MEAL    . PRASUGREL HCL PO Take 20 mg by mouth daily.     . rosuvastatin (CRESTOR) 10 MG tablet Take 10 mg by mouth at bedtime.     . vitamin E 1000 UNIT capsule Take 1 capsule by mouth daily.    . nitroGLYCERIN (NITROSTAT) 0.4 MG SL tablet  Place under the tongue.     No current facility-administered medications for this visit.     Allergies:   Aspirin, Epinephrine, and Hydrocodone   Social History:  The patient  reports that she quit smoking about 40 years ago. Her smoking use included cigarettes. She has a 3.50 pack-year smoking history. She has never used smokeless tobacco. She reports current alcohol use. She reports that she does not use drugs.   Family History:   family history includes Healthy in her daughter, mother, and son; Heart disease in her father; Hypertension in her sister; Rheumatic fever in her father and sister.    Review of Systems: Review of Systems  Constitutional:  Negative.        Weight gain  HENT: Negative.   Respiratory: Negative.   Cardiovascular: Negative.   Gastrointestinal: Negative.   Musculoskeletal: Negative.   Neurological: Negative.   Psychiatric/Behavioral: Negative.   All other systems reviewed and are negative.   PHYSICAL EXAM: VS:  BP 138/72 (BP Location: Right Arm, Patient Position: Sitting, Cuff Size: Normal)   Pulse 60   Ht 5\' 2"  (1.575 m)   Wt 208 lb (94.3 kg)   SpO2 95%   BMI 38.04 kg/m  , BMI Body mass index is 38.04 kg/m. GEN: Well nourished, well developed, in no acute distress HEENT: normal Neck: no JVD, carotid bruits, or masses Cardiac: RRR; no murmurs, rubs, or gallops,no edema  Respiratory:  clear to auscultation bilaterally, normal work of breathing GI: soft, nontender, nondistended, + BS MS: no deformity or atrophy Skin: warm and dry, no rash Neuro:  Strength and sensation are intact Psych: euthymic mood, full affect   Recent Labs: 03/19/2020: ALT 17; BUN 21; Creat 0.99; Hemoglobin 12.7; Platelets 205; Potassium 4.5; Sodium 140    Lipid Panel No results found for: CHOL, HDL, LDLCALC, TRIG    Wt Readings from Last 3 Encounters:  06/16/20 208 lb (94.3 kg)  03/19/20 204 lb 6.4 oz (92.7 kg)  09/12/19 185 lb 6.4 oz (84.1 kg)     ASSESSMENT AND PLAN:  Problem List Items Addressed This Visit      Cardiology Problems   CAD (coronary artery disease), native coronary artery   Relevant Medications   aspirin 81 MG chewable tablet     Other   Diabetes mellitus type 2 with complications (HCC)   Relevant Medications   glipiZIDE (GLUCOTROL XL) 5 MG 24 hr tablet   aspirin 81 MG chewable tablet   Dyslipidemia - Primary     Coronary disease with stable angina Currently with no symptoms of angina. No further workup at this time. Continue current medication regimen. Discussed prior cardiac catheterization, She would prefer to come off the Effient given significant bruising We will hold the Effient,  continue aspirin [facility]  Diabetes type 2 with complications Long discussion with her concerning importance of better diabetes control Stressed importance of better hemoglobin A1c control We have encouraged continued exercise, careful diet management in an effort to lose weight.  Hyperlipidemia Lab work has been requested from primary care Goal LDL less than 70  Morbid obesity We have encouraged continued exercise, careful diet management in an effort to lose weight.     Total encounter time more than 60 minutes  Greater than 50% was spent in counseling and coordination of care with the patient  Patient was seen in consultation for Dr. Inda Merlin will be referred back to his office for ongoing care of the issues detailed above  Signed, Esmond Plants, M.D., Ph.D. [place], [place]

## 2020-06-16 NOTE — Patient Instructions (Addendum)
We will try to get the labs from Dr. Inda Merlin  Goal LDL <70 Goal total chol <150 Goal HBA1C <7  Medication Instructions:  Ok to hold the effient/prasugrel Stay on asa  If you need a refill on your cardiac medications before your next appointment, please call your pharmacy.    Lab work: No new labs needed   If you have labs (blood work) drawn today and your tests are completely normal, you will receive your results only by: Marland Kitchen MyChart Message (if you have MyChart) OR . A paper copy in the mail If you have any lab test that is abnormal or we need to change your treatment, we will call you to review the results.   Testing/Procedures: No new testing needed   Follow-Up: At [facility], you and your health needs are our priority.  As part of our continuing mission to provide you with exceptional heart care, we have created designated Provider Care Teams.  These Care Teams include your primary Cardiologist (physician) and Advanced Practice Providers (APPs -  Physician Assistants and Nurse Practitioners) who all work together to provide you with the care you need, when you need it.  . You will need a follow up appointment in 12 months   . Providers on your designated Care Team:   . Murray Hodgkins, NP . Christell Faith, PA-C . Marrianne Mood, PA-C  Any Other Special Instructions Will Be Listed Below (If Applicable).  COVID-19 Vaccine Information can be found at: [email or web address].uk For questions related to vaccine distribution or appointments, please email [email or web address] or call [phone].

## 2020-06-25 ENCOUNTER — Other Ambulatory Visit: Payer: Self-pay | Admitting: Cardiovascular Disease

## 2020-08-07 NOTE — Progress Notes (Signed)
Office Visit Note  Patient: Susan Daniels             Date of Birth: 01/27/1956           MRN: [id number]             PCP: Josetta Huddle, MD Referring: Josetta Huddle, MD Visit Date: 08/21/2020 Occupation: @GUAROCC @  Subjective:  Medication monitoring   History of Present Illness: Susan Daniels is a 64 y.o. female with history of autoimmune disease and osteoarthritis.  She is taking plaquenil 200 mg 1 tablet by mouth twice daily.  She tolerates Plaquenil without any side effects.  She denies any signs or symptoms of a flare recently.  She denies any recent rashes or photosensitivity.  She has not had any oral or nasal ulcerations.  She denies any sicca symptoms recently.  She has not had any chest pain, palpitations, or shortness of breath.  She denies any joint pain or joint swelling at this time.  She is not experiencing any morning stiffness or nocturnal pain. He denies any recent gout flares.  She takes allopurinol 100 mg by mouth daily for management of gout.  She has not had any recent infections.  She has received both COVID-19 vaccinations. He continues to take a calcium and vitamin D supplement.     Activities of Daily Living:  Patient reports morning stiffness for 0 minutes.   Patient Denies nocturnal pain.  Difficulty dressing/grooming: Denies Difficulty climbing stairs: Denies Difficulty getting out of chair: Reports Difficulty using hands for taps, buttons, cutlery, and/or writing: Denies  Review of Systems  Constitutional: Negative for fatigue.  HENT: Negative for mouth sores, mouth dryness and nose dryness.   Eyes: Negative for pain, itching and dryness.  Respiratory: Negative for shortness of breath and difficulty breathing.   Cardiovascular: Negative for chest pain and palpitations.  Gastrointestinal: Negative for blood in stool, constipation and diarrhea.  Endocrine: Negative for increased urination.  Genitourinary: Negative for difficulty urinating and  painful urination.  Musculoskeletal: Negative for arthralgias, joint pain, joint swelling, myalgias, morning stiffness, muscle tenderness and myalgias.  Skin: Negative for color change, rash and redness.  Allergic/Immunologic: Negative for susceptible to infections.  Neurological: Negative for dizziness, numbness, headaches, memory loss and weakness.  Hematological: Negative for bruising/bleeding tendency.  Psychiatric/Behavioral: Negative for confusion and sleep disturbance.    PMFS History:  Patient Active Problem List   Diagnosis Date Noted  . CAD (coronary artery disease), native coronary artery 06/16/2020  . Diabetes mellitus type 2 with complications ([place]) 32/35/5732  . Autoimmune disease ([place]) 06/03/2017  . High risk medication use 06/03/2017  . Primary osteoarthritis of both hands 06/03/2017  . Primary osteoarthritis of both knees 06/03/2017  . Primary osteoarthritis of both feet 06/03/2017  . Heart murmur 06/03/2017  . History of hypothyroidism 06/03/2017  . Dyslipidemia 06/03/2017  . History of diabetes mellitus 06/03/2017  . Vitamin D deficiency 06/03/2017    Past Medical History:  Diagnosis Date  . Arthritis    Rheumatoid  . Diabetes mellitus without complication ([place])   . Headache(784.0)    migraines  . Heart murmur     Family History  Problem Relation Age of Onset  . Healthy Mother   . Heart disease Father   . Rheumatic fever Father   . Hypertension Sister   . Rheumatic fever Sister   . Healthy Son   . Healthy Daughter    Past Surgical History:  Procedure Laterality Date  . COLONOSCOPY WITH  PROPOFOL N/A 01/30/2013   Procedure: COLONOSCOPY WITH PROPOFOL;  Surgeon: Garlan Fair, MD;  Location: [facility];  Service: Endoscopy;  Laterality: N/A;  . dental implant  09/29/2018  . HEART STENT    . TUBAL LIGATION     Social History   Social History Narrative  . Not on file   Immunization History  Administered Date(s) Administered  . PFIZER  SARS-COV-2 Vaccination 01/29/2020, 02/19/2020     Objective: Vital Signs: BP 112/70 (BP Location: Left Arm, Patient Position: Sitting, Cuff Size: Normal)   Pulse (!) 59   Resp 15   Ht $R'5\' 2"'mr$  (1.575 m)   Wt 209 lb 3.2 oz (94.9 kg)   BMI 38.26 kg/m    Physical Exam Vitals and nursing note reviewed.  Constitutional:      Appearance: She is well-developed.  HENT:     Head: Normocephalic and atraumatic.  Eyes:     Conjunctiva/sclera: Conjunctivae normal.  Pulmonary:     Effort: Pulmonary effort is normal.  Abdominal:     Palpations: Abdomen is soft.  Musculoskeletal:     Cervical back: Normal range of motion.  Skin:    General: Skin is warm and dry.     Capillary Refill: Capillary refill takes less than 2 seconds.  Neurological:     Mental Status: She is alert and oriented to person, place, and time.  Psychiatric:        Behavior: Behavior normal.      Musculoskeletal Exam: C-spine, thoracic spine, lumbar spine have good range of motion with no discomfort.  Shoulder joints, elbow joints, wrist joints, MCPs, PIPs, DIPs have good range of motion with no synovitis.  She has complete fist formation bilaterally.  Hip joints have good range of motion with no discomfort.  No tenderness over trochanteric bursa bilaterally.  Knee joints have good range of motion with no warmth or effusion.  Ankle joints have good range of motion with no tenderness or inflammation.  Bunions noted bilaterally.  No tenderness of MTP joints.  CDAI Exam: CDAI Score: -- Patient Global: --; Provider Global: -- Swollen: --; Tender: -- Joint Exam 08/21/2020   No joint exam has been documented for this visit   There is currently no information documented on the homunculus. Go to the Rheumatology activity and complete the homunculus joint exam.  Investigation: No additional findings.  Imaging: No results found.  Recent Labs: Lab Results  Component Value Date   WBC 5.4 03/19/2020   HGB 12.7 03/19/2020    PLT 205 03/19/2020   NA 140 03/19/2020   K 4.5 03/19/2020   CL 104 03/19/2020   CO2 29 03/19/2020   GLUCOSE 145 (H) 03/19/2020   BUN 21 03/19/2020   CREATININE 0.99 03/19/2020   BILITOT 0.7 03/19/2020   ALKPHOS 180 (H) 05/18/2019   AST 15 03/19/2020   ALT 17 03/19/2020   PROT 6.7 03/19/2020   ALBUMIN 3.8 05/18/2019   CALCIUM 9.6 03/19/2020   GFRAA 70 03/19/2020    Speciality Comments: PLQ eye exam: 08/22/19 Normal. [facility].   Procedures:  No procedures performed Allergies: Aspirin, Epinephrine, and Hydrocodone   Assessment / Plan:     Visit Diagnoses: Autoimmune disease ([place]) - History of rash, arthralgias, positive ANA(ENA, C3-C4, anticardiolipin, beta-2, lupus anticoagulant was negative.  ANA was 1: 320 speckled), photosensitivity: She has not had any signs or symptoms of an autoimmune disease flare recently.  She is clinically doing well on Plaquenil 200 mg 1 tablet by mouth  twice daily.  She is tolerating Plaquenil without any side effects.  She has no joint tenderness or synovitis on examination today.  She has not had any morning stiffness or nocturnal pain.  She has not had any recent rashes, photosensitivity, symptoms of Raynaud's, oral or nasal ulcerations, sicca symptoms, enlarged lymph nodes, chest pain, or shortness of breath.  We discussed the importance of wearing sunscreen SPF 50 or greater on a daily basis and avoiding direct sun exposure.  We discussed the importance of avoiding triggers for symptoms of Raynaud's.  She was advised to notify us if she develops digital ulcerations or signs of gangrene.  Lab work from 03/19/2020 was reviewed with the patient today in the office: Complements within normal limits, double-stranded DNA negative, ESR 11.  We will repeat autoimmune lab work today.  Orders were released.  She will continue taking Plaquenil 200 mg 1 tablet by mouth twice daily.  She has an upcoming Plaquenil eye exam scheduled on 09/03/2020.  She was given a  Plaquenil eye exam form to take with her to her upcoming appointment.  She was advised to notify us if she develops any new or worsening symptoms.  She will follow-up in the office in 5 months.- Plan: CBC with Differential/Platelet, COMPLETE METABOLIC PANEL WITH GFR, Urinalysis, Routine w reflex microscopic, Anti-DNA antibody, double-stranded, C3 and C4, Sedimentation rate  High risk medication use - Plaquenil 200 mg 1 tablet by mouth twice daily.  PLQ eye exam: 08/22/19.  She has an upcoming Plaquenil eye exam on 09/03/2020.  She was given a Plaquenil eye exam form to take with her to her upcoming appointment.  CBC and CMP were drawn on 03/19/2020.  She is due to update lab work today.  Orders for CBC and CMP were released.- Plan: CBC with Differential/Platelet, COMPLETE METABOLIC PANEL WITH GFR She has received both COVID-19 vaccinations and was encouraged to receive the third dose.  She was advised to avoid taking Tylenol and NSAIDs 24 hours prior to the third dose.  She was advised to notify us or her PCP if she develops a COVID-19 infection in order to receive the antibody infusion.  Primary osteoarthritis of both hands: She has PIP and DIP thickening consistent with osteoarthritis of both hands.  No tenderness or inflammation was noted on exam.  She is able to make a complete fist bilaterally.  Primary osteoarthritis of both knees -  She noticed significant improvement after undergoing Visco gel injections in the right knee in January/February 2020.  She has good range of motion of both knee joints on exam with no warmth or effusion.  She was advised to notify us if she would like to reapply for Visco gel injections in the future.  Primary osteoarthritis of both feet: She has PIP and DIP thickening consistent with osteoarthritis of both feet.  Bunions noted bilaterally.  No tenderness palpation on examination today.  She is wearing proper fitting shoes.  Chronic idiopathic gout involving toe of right  foot without tophus -She has not had any recent gout flares.  She is clinically doing well taking allopurinol 100 mg 1 tablet daily and colchicine 0.6 mg 1 tablet daily as needed.  She has not needed to take colchicine since her last visit.  We will recheck uric acid level today.  Uric acid: 03/19/2020 6.9.  She was advised to notify us if she develops signs or symptoms of a gout flare.- Plan: Uric acid  Other medical conditions are listed as follows:  Dyslipidemia  History of hypothyroidism  History of vitamin D deficiency  Heart murmur  History of diabetes mellitus  Postmenopausal  Orders: Orders Placed This Encounter  Procedures  . CBC with Differential/Platelet  . COMPLETE METABOLIC PANEL WITH GFR  . Urinalysis, Routine w reflex microscopic  . Anti-DNA antibody, double-stranded  . C3 and C4  . Sedimentation rate  . Uric acid   No orders of the defined types were placed in this encounter.    Follow-Up Instructions: Return in about 5 months (around 01/19/2021) for Osteoarthritis, Autoimmune Disease.   Ofilia Neas, PA-C  Note - This record has been created using Dragon software.  Chart creation errors have been sought, but may not always  have been located. Such creation errors do not reflect on  the standard of medical care.

## 2020-08-17 ENCOUNTER — Other Ambulatory Visit: Payer: Self-pay | Admitting: Rheumatology

## 2020-08-18 NOTE — Telephone Encounter (Signed)
Last Visit: 03/19/2020 Next Visit: 08/21/2020 Labs: 03/19/2020 Glucose is elevated, Uric acid is mildly. No change in treatment advised.  Current Dose per office note 03/19/2020: allopurinol 100 mg 1 tablet daily  DX: Chronic idiopathic gout involving toe of right foot without tophus   Okay to refill per Dr. Estanislado Pandy

## 2020-08-21 ENCOUNTER — Encounter: Payer: Self-pay | Admitting: Physician Assistant

## 2020-08-21 ENCOUNTER — Ambulatory Visit: Payer: BC Managed Care – PPO | Admitting: Physician Assistant

## 2020-08-21 ENCOUNTER — Other Ambulatory Visit: Payer: Self-pay

## 2020-08-21 VITALS — BP 112/70 | HR 59 | Resp 15 | Ht 62.0 in | Wt 209.2 lb

## 2020-08-21 DIAGNOSIS — M359 Systemic involvement of connective tissue, unspecified: Secondary | ICD-10-CM

## 2020-08-21 DIAGNOSIS — M19071 Primary osteoarthritis, right ankle and foot: Secondary | ICD-10-CM

## 2020-08-21 DIAGNOSIS — E785 Hyperlipidemia, unspecified: Secondary | ICD-10-CM

## 2020-08-21 DIAGNOSIS — M19041 Primary osteoarthritis, right hand: Secondary | ICD-10-CM | POA: Diagnosis not present

## 2020-08-21 DIAGNOSIS — R011 Cardiac murmur, unspecified: Secondary | ICD-10-CM

## 2020-08-21 DIAGNOSIS — M170 Bilateral primary osteoarthritis of knee: Secondary | ICD-10-CM

## 2020-08-21 DIAGNOSIS — M19042 Primary osteoarthritis, left hand: Secondary | ICD-10-CM

## 2020-08-21 DIAGNOSIS — M1A0710 Idiopathic chronic gout, right ankle and foot, without tophus (tophi): Secondary | ICD-10-CM

## 2020-08-21 DIAGNOSIS — Z8639 Personal history of other endocrine, nutritional and metabolic disease: Secondary | ICD-10-CM

## 2020-08-21 DIAGNOSIS — Z79899 Other long term (current) drug therapy: Secondary | ICD-10-CM | POA: Diagnosis not present

## 2020-08-21 DIAGNOSIS — Z780 Asymptomatic menopausal state: Secondary | ICD-10-CM

## 2020-08-21 DIAGNOSIS — M19072 Primary osteoarthritis, left ankle and foot: Secondary | ICD-10-CM

## 2020-08-22 LAB — URINALYSIS, ROUTINE W REFLEX MICROSCOPIC
Bacteria, UA: NONE SEEN /HPF
Bilirubin Urine: NEGATIVE
Glucose, UA: NEGATIVE
Hgb urine dipstick: NEGATIVE
Hyaline Cast: NONE SEEN /LPF
Ketones, ur: NEGATIVE
Nitrite: NEGATIVE
Protein, ur: NEGATIVE
RBC / HPF: NONE SEEN /HPF (ref 0–2)
Specific Gravity, Urine: 1.013 (ref 1.001–1.03)
pH: <=5 (ref 5.0–8.0)

## 2020-08-22 LAB — CBC WITH DIFFERENTIAL/PLATELET
Absolute Monocytes: 506 cells/uL (ref 200–950)
Basophils Absolute: 88 cells/uL (ref 0–200)
Basophils Relative: 1.6 %
Eosinophils Absolute: 347 cells/uL (ref 15–500)
Eosinophils Relative: 6.3 %
HCT: 37.8 % (ref 35.0–45.0)
Hemoglobin: 12.5 g/dL (ref 11.7–15.5)
Lymphs Abs: 1249 cells/uL (ref 850–3900)
MCH: 30.6 pg (ref 27.0–33.0)
MCHC: 33.1 g/dL (ref 32.0–36.0)
MCV: 92.6 fL (ref 80.0–100.0)
MPV: 12.7 fL — ABNORMAL HIGH (ref 7.5–12.5)
Monocytes Relative: 9.2 %
Neutro Abs: 3311 cells/uL (ref 1500–7800)
Neutrophils Relative %: 60.2 %
Platelets: 190 10*3/uL (ref 140–400)
RBC: 4.08 10*6/uL (ref 3.80–5.10)
RDW: 12 % (ref 11.0–15.0)
Total Lymphocyte: 22.7 %
WBC: 5.5 10*3/uL (ref 3.8–10.8)

## 2020-08-22 LAB — COMPLETE METABOLIC PANEL WITH GFR
AG Ratio: 1.9 (calc) (ref 1.0–2.5)
ALT: 16 U/L (ref 6–29)
AST: 13 U/L (ref 10–35)
Albumin: 4.2 g/dL (ref 3.6–5.1)
Alkaline phosphatase (APISO): 57 U/L (ref 37–153)
BUN/Creatinine Ratio: 19 (calc) (ref 6–22)
BUN: 20 mg/dL (ref 7–25)
CO2: 30 mmol/L (ref 20–32)
Calcium: 9.1 mg/dL (ref 8.6–10.4)
Chloride: 104 mmol/L (ref 98–110)
Creat: 1.03 mg/dL — ABNORMAL HIGH (ref 0.50–0.99)
GFR, Est African American: 67 mL/min/{1.73_m2} (ref >=60)
GFR, Est Non African American: 57 mL/min/{1.73_m2} — ABNORMAL LOW (ref >=60)
Globulin: 2.2 g/dL (calc) (ref 1.9–3.7)
Glucose, Bld: 229 mg/dL — ABNORMAL HIGH (ref 65–99)
Potassium: 4.7 mmol/L (ref 3.5–5.3)
Sodium: 139 mmol/L (ref 135–146)
Total Bilirubin: 0.4 mg/dL (ref 0.2–1.2)
Total Protein: 6.4 g/dL (ref 6.1–8.1)

## 2020-08-22 LAB — C3 AND C4
C3 Complement: 136 mg/dL (ref 83–193)
C4 Complement: 28 mg/dL (ref 15–57)

## 2020-08-22 LAB — ANTI-DNA ANTIBODY, DOUBLE-STRANDED: ds DNA Ab: <1 IU/mL

## 2020-08-22 LAB — SEDIMENTATION RATE: Sed Rate: 9 mm/h (ref 0–30)

## 2020-08-22 LAB — URIC ACID: Uric Acid, Serum: 5.5 mg/dL (ref 2.5–7.0)

## 2020-08-22 NOTE — Progress Notes (Signed)
DsDNA is negative.  Labs are not consistent with a flare.  No change in therapy recommended

## 2020-08-22 NOTE — Progress Notes (Signed)
CBC WNL.  Creatinine is borderline elevated-1.03 and GFR is borderline low-57. Please advise the patient to avoid taking NSAIDs.   Glucose is elevated-229.  Please notify the patient.  UA trace leukocytes.  Negative for nitrites and bacteria.  Please advise the patient to follow up with PCP if she develops symptoms of a UTI. ESR WNL. Complements WNL. DsDNA is pending. Uric acid is within the desirable range.

## 2020-09-04 DIAGNOSIS — Z79899 Other long term (current) drug therapy: Secondary | ICD-10-CM | POA: Diagnosis not present

## 2020-09-04 LAB — HM DIABETES EYE EXAM

## 2020-10-06 DIAGNOSIS — E782 Mixed hyperlipidemia: Secondary | ICD-10-CM | POA: Diagnosis not present

## 2020-10-06 DIAGNOSIS — E1165 Type 2 diabetes mellitus with hyperglycemia: Secondary | ICD-10-CM | POA: Diagnosis not present

## 2020-10-06 DIAGNOSIS — M109 Gout, unspecified: Secondary | ICD-10-CM | POA: Diagnosis not present

## 2020-10-06 DIAGNOSIS — E039 Hypothyroidism, unspecified: Secondary | ICD-10-CM | POA: Diagnosis not present

## 2020-10-06 DIAGNOSIS — Z1231 Encounter for screening mammogram for malignant neoplasm of breast: Secondary | ICD-10-CM | POA: Diagnosis not present

## 2020-11-13 ENCOUNTER — Other Ambulatory Visit: Payer: Self-pay | Admitting: Rheumatology

## 2020-11-13 NOTE — Telephone Encounter (Signed)
Last Visit: 08/21/2020 Next Visit: 01/26/2021 Labs: 08/21/2020, CBC WNL. Creatinine is borderline elevated-1.03 and GFR is borderline low-57. Glucose is elevated-229.   Uric acid is within the desirable range.   Current Dose per office note 08/21/2020: allopurinol 100 mg 1 tablet daily DX: Chronic idiopathic gout involving toe of right foot without tophus  Okay to refill Allopurinol?

## 2020-12-05 DIAGNOSIS — E1165 Type 2 diabetes mellitus with hyperglycemia: Secondary | ICD-10-CM | POA: Diagnosis not present

## 2020-12-05 DIAGNOSIS — R030 Elevated blood-pressure reading, without diagnosis of hypertension: Secondary | ICD-10-CM | POA: Diagnosis not present

## 2020-12-05 DIAGNOSIS — E782 Mixed hyperlipidemia: Secondary | ICD-10-CM | POA: Diagnosis not present

## 2020-12-05 DIAGNOSIS — M359 Systemic involvement of connective tissue, unspecified: Secondary | ICD-10-CM | POA: Diagnosis not present

## 2020-12-10 ENCOUNTER — Other Ambulatory Visit: Payer: Self-pay | Admitting: Physician Assistant

## 2020-12-10 NOTE — Telephone Encounter (Signed)
Last Visit: 08/21/2020 Next Visit: 01/26/2021 Labs: 08/21/2020, CBC WNL. Creatinine is borderline elevated-1.03 and GFR is borderline low-57. Please advise the patient to avoid taking NSAIDs.  Glucose is elevated-229. UA trace leukocytes. Negative for nitrites and bacteria. Please advise the patient to follow up with PCP if she develops symptoms of a UTI. ESR WNL. Complements WNL. DsDNA is pending. Uric acid is within the desirable range.  DsDNA is negative. Labs are not consistent with a flare. No change in therapy recommended Current Dose per office note 08/21/2020, allopurinol 100 mg 1 tablet daily DX: Autoimmune disease and Chronic idiopathic gout involving toe of right foot without tophus   Okay to refill Allopurinol?

## 2020-12-24 DIAGNOSIS — L821 Other seborrheic keratosis: Secondary | ICD-10-CM | POA: Diagnosis not present

## 2020-12-24 DIAGNOSIS — L720 Epidermal cyst: Secondary | ICD-10-CM | POA: Diagnosis not present

## 2020-12-24 DIAGNOSIS — L298 Other pruritus: Secondary | ICD-10-CM | POA: Diagnosis not present

## 2020-12-24 DIAGNOSIS — L538 Other specified erythematous conditions: Secondary | ICD-10-CM | POA: Diagnosis not present

## 2020-12-24 DIAGNOSIS — L820 Inflamed seborrheic keratosis: Secondary | ICD-10-CM | POA: Diagnosis not present

## 2021-01-12 NOTE — Progress Notes (Signed)
Office Visit Note  Patient: Susan Daniels             Date of Birth: [birth year]           MRN: [id number]             PCP: Josetta Huddle, MD Referring: Josetta Huddle, MD Visit Date: 01/26/2021 Occupation: @GUAROCC @  Subjective:  Medication management.   History of Present Illness: Susan Daniels is a 65 y.o. female with a history of autoimmune disease and osteoarthritis.  She states she has been doing very well on Plaquenil.  She has been on Plaquenil since she was 65 years old.  I reviewed her last eye examination from October 2021 which she states that her field of vision was normal but her OCT test was abnormal.  She has not noticed any visual changes.  She denies any joint pain or joint swelling.  She states last time when she tried to come off Plaquenil she had severe swelling in her hands and she could not do routine activities she is very hesitant to come off Plaquenil.  She states her knee joints have been doing well since she had Visco supplement injections to her knees.  She has not had any gout flare.  She has been taking allopurinol on a regular basis.  Activities of Daily Living:  Patient reports morning stiffness for 0 minutes.   Patient Denies nocturnal pain.  Difficulty dressing/grooming: Denies Difficulty climbing stairs: Reports Difficulty getting out of chair: Denies Difficulty using hands for taps, buttons, cutlery, and/or writing: Denies  Review of Systems  Constitutional: Negative for fatigue.  HENT: Negative for mouth sores, mouth dryness and nose dryness.   Eyes: Negative for pain, itching and dryness.  Respiratory: Negative for shortness of breath and difficulty breathing.   Cardiovascular: Negative for chest pain and palpitations.  Gastrointestinal: Negative for blood in stool, constipation and diarrhea.  Endocrine: Negative for increased urination.  Genitourinary: Negative for difficulty urinating.  Musculoskeletal: Negative for arthralgias, joint  pain, joint swelling, myalgias, morning stiffness, muscle tenderness and myalgias.  Skin: Negative for color change, rash and redness.  Allergic/Immunologic: Negative for susceptible to infections.  Neurological: Negative for dizziness, numbness, headaches, memory loss and weakness.  Hematological: Negative for bruising/bleeding tendency.  Psychiatric/Behavioral: Negative for confusion.    PMFS History:  Patient Active Problem List   Diagnosis Date Noted  . CAD (coronary artery disease), native coronary artery 06/16/2020  . Diabetes mellitus type 2 with complications ([place]) 61/60/7371  . Autoimmune disease ([place]) 06/03/2017  . High risk medication use 06/03/2017  . Primary osteoarthritis of both hands 06/03/2017  . Primary osteoarthritis of both knees 06/03/2017  . Primary osteoarthritis of both feet 06/03/2017  . Heart murmur 06/03/2017  . History of hypothyroidism 06/03/2017  . Dyslipidemia 06/03/2017  . History of diabetes mellitus 06/03/2017  . Vitamin D deficiency 06/03/2017    Past Medical History:  Diagnosis Date  . Arthritis    Rheumatoid  . Diabetes mellitus without complication ([place])   . Headache(784.0)    migraines  . Heart murmur     Family History  Problem Relation Age of Onset  . Healthy Mother   . Heart disease Father   . Rheumatic fever Father   . Hypertension Sister   . Rheumatic fever Sister   . Healthy Son   . Healthy Daughter    Past Surgical History:  Procedure Laterality Date  . COLONOSCOPY WITH PROPOFOL N/A 01/30/2013   Procedure: COLONOSCOPY WITH  PROPOFOL;  Surgeon: Garlan Fair, MD;  Location: [facility];  Service: Endoscopy;  Laterality: N/A;  . dental implant  09/29/2018  . HEART STENT    . TUBAL LIGATION     Social History   Social History Narrative  . Not on file   Immunization History  Administered Date(s) Administered  . PFIZER([place])SARS-COV-2 Vaccination 01/29/2020, 02/19/2020, 10/10/2020     Objective: Vital  Signs: BP (!) 145/78 (BP Location: Left Arm, Patient Position: Sitting, Cuff Size: Normal)   Pulse 67   Resp 14   Ht 5\' 2"  (1.575 m)   Wt 203 lb 6.4 oz (92.3 kg)   BMI 37.20 kg/m    Physical Exam Vitals and nursing note reviewed.  Constitutional:      Appearance: She is well-developed.  HENT:     Head: Normocephalic and atraumatic.  Eyes:     Conjunctiva/sclera: Conjunctivae normal.  Cardiovascular:     Rate and Rhythm: Normal rate and regular rhythm.     Heart sounds: Normal heart sounds.  Pulmonary:     Effort: Pulmonary effort is normal.     Breath sounds: Normal breath sounds.  Abdominal:     General: Bowel sounds are normal.     Palpations: Abdomen is soft.  Musculoskeletal:     Cervical back: Normal range of motion.  Lymphadenopathy:     Cervical: No cervical adenopathy.  Skin:    General: Skin is warm and dry.     Capillary Refill: Capillary refill takes less than 2 seconds.  Neurological:     Mental Status: She is alert and oriented to person, place, and time.  Psychiatric:        Behavior: Behavior normal.      Musculoskeletal Exam: C-spine thoracic and lumbar spine with good range of motion.  Shoulder joints, elbow joints, wrist joints, MCPs PIPs and DIPs with good range of motion with no synovitis.  She has some DIP and PIP thickening in her hands and feet consistent with osteoarthritis.  She had no discomfort with range of motion of her knee joints without any warmth swelling effusion.  CDAI Exam: CDAI Score: - Patient Global: -; Provider Global: - Swollen: -; Tender: - Joint Exam 01/26/2021   No joint exam has been documented for this visit   There is currently no information documented on the homunculus. Go to the Rheumatology activity and complete the homunculus joint exam.  Investigation: No additional findings.  Imaging: No results found.  Recent Labs: Lab Results  Component Value Date   WBC 5.5 08/21/2020   HGB 12.5 08/21/2020   PLT 190  08/21/2020   NA 139 08/21/2020   K 4.7 08/21/2020   CL 104 08/21/2020   CO2 30 08/21/2020   GLUCOSE 229 (H) 08/21/2020   BUN 20 08/21/2020   CREATININE 1.03 (H) 08/21/2020   BILITOT 0.4 08/21/2020   ALKPHOS 180 (H) 05/18/2019   AST 13 08/21/2020   ALT 16 08/21/2020   PROT 6.4 08/21/2020   ALBUMIN 3.8 05/18/2019   CALCIUM 9.1 08/21/2020   GFRAA 67 08/21/2020    Speciality Comments: PLQ eye exam: 09/08/2020 WNL [place] Follow up in 6 months. Abnormal OCT.  Procedures:  No procedures performed Allergies: Aspirin, Epinephrine, and Hydrocodone   Assessment / Plan:     Visit Diagnoses: Autoimmune disease ([place]) - History of rash, arthralgias, positive ANA(ENA, C3-C4, anticardiolipin, beta-2, lupus anticoagulant was negative.  ANA was 1: 320 speckled), photosensitivity: -She currently has no clinical features  of autoimmune disease.  She states she could not taper Plaquenil in the past as it caused joint swelling.  She has been on Plaquenil since she was 65 years old.  That increases her risk of macular toxicity.  The most recent eye examination in October 2021 showed abnormal OCT although her field of vision was normal.  I would refer her to a retinal specialist.  I will obtain following labs today.  I have advised her to reduce hydroxychloroquine to 1 tablet daily.  Plan: ANA, Urinalysis, Routine w reflex microscopic, Anti-DNA antibody, double-stranded, C3 and C4, Sedimentation rate.  High risk medication use - Plaquenil 200 mg 1 tablet by mouth twice daily.  PLQ eye exam: 09/08/2020 - Plan: CBC with Differential/Platelet, COMPLETE METABOLIC PANEL WITH GFR  Primary osteoarthritis of both hands-she has DIP and PIP thickening but no synovitis was noted.  Primary osteoarthritis of both knees-she had good response to Visco supplement injections which she had in February 2020.  Primary osteoarthritis of both feet-proper fitting shoes were discussed.  Chronic idiopathic gout involving  toe of right foot without tophus - allopurinol 100 mg 1 tablet daily and colchicine 0.6 mg 1 tablet daily as needed. -She has not had any gout flare.  I will check uric acid level today.  Plan: Uric acid  History of hypothyroidism  History of vitamin D deficiency  Dyslipidemia-increased risk of heart disease with autoimmune disease was discussed.  Dietary modifications were discussed.  Handout was placed in the AVS.  History of diabetes mellitus  Heart murmur  Osteopenia of multiple sites-Apr 02, 2020 DEXA scan showed a T score of -1.4, BMD 0.698 in the right femoral neck.  Use of calcium and vitamin D and resistive exercise was emphasized.  .  Orders: Orders Placed This Encounter  Procedures  . CBC with Differential/Platelet  . COMPLETE METABOLIC PANEL WITH GFR  . ANA  . Urinalysis, Routine w reflex microscopic  . Anti-DNA antibody, double-stranded  . C3 and C4  . Sedimentation rate  . Uric acid  . Ambulatory referral to Ophthalmology   No orders of the defined types were placed in this encounter.   Follow-Up Instructions: Return in about 3 months (around 04/28/2021) for Autoimmune disease.   Bo Merino, MD  Note - This record has been created using Editor, commissioning.  Chart creation errors have been sought, but may not always  have been located. Such creation errors do not reflect on  the standard of medical care.

## 2021-01-26 ENCOUNTER — Other Ambulatory Visit: Payer: Self-pay

## 2021-01-26 ENCOUNTER — Ambulatory Visit: Payer: BC Managed Care – PPO | Admitting: Rheumatology

## 2021-01-26 ENCOUNTER — Encounter: Payer: Self-pay | Admitting: Rheumatology

## 2021-01-26 VITALS — BP 145/78 | HR 67 | Resp 14 | Ht 62.0 in | Wt 203.4 lb

## 2021-01-26 DIAGNOSIS — M19041 Primary osteoarthritis, right hand: Secondary | ICD-10-CM | POA: Diagnosis not present

## 2021-01-26 DIAGNOSIS — M1A0710 Idiopathic chronic gout, right ankle and foot, without tophus (tophi): Secondary | ICD-10-CM

## 2021-01-26 DIAGNOSIS — M19042 Primary osteoarthritis, left hand: Secondary | ICD-10-CM

## 2021-01-26 DIAGNOSIS — M8589 Other specified disorders of bone density and structure, multiple sites: Secondary | ICD-10-CM

## 2021-01-26 DIAGNOSIS — M19072 Primary osteoarthritis, left ankle and foot: Secondary | ICD-10-CM

## 2021-01-26 DIAGNOSIS — M359 Systemic involvement of connective tissue, unspecified: Secondary | ICD-10-CM

## 2021-01-26 DIAGNOSIS — M19071 Primary osteoarthritis, right ankle and foot: Secondary | ICD-10-CM

## 2021-01-26 DIAGNOSIS — R011 Cardiac murmur, unspecified: Secondary | ICD-10-CM

## 2021-01-26 DIAGNOSIS — Z79899 Other long term (current) drug therapy: Secondary | ICD-10-CM

## 2021-01-26 DIAGNOSIS — Z8639 Personal history of other endocrine, nutritional and metabolic disease: Secondary | ICD-10-CM

## 2021-01-26 DIAGNOSIS — M170 Bilateral primary osteoarthritis of knee: Secondary | ICD-10-CM | POA: Diagnosis not present

## 2021-01-26 DIAGNOSIS — E785 Hyperlipidemia, unspecified: Secondary | ICD-10-CM

## 2021-01-26 DIAGNOSIS — Z780 Asymptomatic menopausal state: Secondary | ICD-10-CM

## 2021-01-26 NOTE — Patient Instructions (Addendum)
Please decrease dose of Plaquenil 200 mg tablet, 1 tablet by mouth daily.  You will get a phone call from retina specialist for evaluation.  Heart Disease Prevention   Your inflammatory disease increases your risk of heart disease which includes heart attack, stroke, atrial fibrillation (irregular heartbeats), high blood pressure, heart failure and atherosclerosis (plaque in the arteries).  It is important to reduce your risk by:   . Keep blood pressure, cholesterol, and blood sugar at healthy levels   . Smoking Cessation   . Maintain a healthy weight  o BMI 20-25   . Eat a healthy diet  o Plenty of fresh fruit, vegetables, and whole grains  o Limit saturated fats, foods high in sodium, and added sugars  o DASH and Mediterranean diet   . Increase physical activity  o Recommend moderate physically activity for 150 minutes per week/ 30 minutes a day for five days a week These can be broken up into three separate ten-minute sessions during the day.   . Reduce Stress  . Meditation, slow breathing exercises, yoga, coloring books  . Dental visits twice a year

## 2021-01-29 LAB — CBC WITH DIFFERENTIAL/PLATELET
Absolute Monocytes: 525 cells/uL (ref 200–950)
Basophils Absolute: 88 cells/uL (ref 0–200)
Basophils Relative: 1.7 %
Eosinophils Absolute: 322 cells/uL (ref 15–500)
Eosinophils Relative: 6.2 %
HCT: 38 % (ref 35.0–45.0)
Hemoglobin: 12.6 g/dL (ref 11.7–15.5)
Lymphs Abs: 1331 cells/uL (ref 850–3900)
MCH: 30 pg (ref 27.0–33.0)
MCHC: 33.2 g/dL (ref 32.0–36.0)
MCV: 90.5 fL (ref 80.0–100.0)
MPV: 12.4 fL (ref 7.5–12.5)
Monocytes Relative: 10.1 %
Neutro Abs: 2933 cells/uL (ref 1500–7800)
Neutrophils Relative %: 56.4 %
Platelets: 208 10*3/uL (ref 140–400)
RBC: 4.2 10*6/uL (ref 3.80–5.10)
RDW: 12.2 % (ref 11.0–15.0)
Total Lymphocyte: 25.6 %
WBC: 5.2 10*3/uL (ref 3.8–10.8)

## 2021-01-29 LAB — URINALYSIS, ROUTINE W REFLEX MICROSCOPIC
Bacteria, UA: NONE SEEN /HPF
Bilirubin Urine: NEGATIVE
Glucose, UA: NEGATIVE
Hgb urine dipstick: NEGATIVE
Hyaline Cast: NONE SEEN /LPF
Ketones, ur: NEGATIVE
Nitrite: NEGATIVE
Protein, ur: NEGATIVE
RBC / HPF: NONE SEEN /HPF (ref 0–2)
Specific Gravity, Urine: 1.006 (ref 1.001–1.03)
pH: <=5 (ref 5.0–8.0)

## 2021-01-29 LAB — COMPLETE METABOLIC PANEL WITH GFR
AG Ratio: 2 (calc) (ref 1.0–2.5)
ALT: 17 U/L (ref 6–29)
AST: 15 U/L (ref 10–35)
Albumin: 4.4 g/dL (ref 3.6–5.1)
Alkaline phosphatase (APISO): 60 U/L (ref 37–153)
BUN/Creatinine Ratio: 16 (calc) (ref 6–22)
BUN: 17 mg/dL (ref 7–25)
CO2: 26 mmol/L (ref 20–32)
Calcium: 9.1 mg/dL (ref 8.6–10.4)
Chloride: 105 mmol/L (ref 98–110)
Creat: 1.05 mg/dL — ABNORMAL HIGH (ref 0.50–0.99)
GFR, Est African American: 65 mL/min/{1.73_m2} (ref >=60)
GFR, Est Non African American: 56 mL/min/{1.73_m2} — ABNORMAL LOW (ref >=60)
Globulin: 2.2 g/dL (calc) (ref 1.9–3.7)
Glucose, Bld: 142 mg/dL — ABNORMAL HIGH (ref 65–99)
Potassium: 4.6 mmol/L (ref 3.5–5.3)
Sodium: 142 mmol/L (ref 135–146)
Total Bilirubin: 0.7 mg/dL (ref 0.2–1.2)
Total Protein: 6.6 g/dL (ref 6.1–8.1)

## 2021-01-29 LAB — ANTI-NUCLEAR AB-TITER (ANA TITER): ANA Titer 1: 1:320 {titer} — ABNORMAL HIGH

## 2021-01-29 LAB — C3 AND C4
C3 Complement: 141 mg/dL (ref 83–193)
C4 Complement: 30 mg/dL (ref 15–57)

## 2021-01-29 LAB — ANA: Anti Nuclear Antibody (ANA): POSITIVE — AB

## 2021-01-29 LAB — SEDIMENTATION RATE: Sed Rate: 11 mm/h (ref 0–30)

## 2021-01-29 LAB — ANTI-DNA ANTIBODY, DOUBLE-STRANDED: ds DNA Ab: <1 IU/mL

## 2021-01-29 LAB — URIC ACID: Uric Acid, Serum: 6.6 mg/dL (ref 2.5–7.0)

## 2021-01-29 NOTE — Progress Notes (Signed)
CBC is normal, glucose is elevated, GFR is decreased at 56 and is stable, most likely due to the use of ACE inhibitor.  Urine showed white cells otherwise unremarkable, ANA is positive and stable.  Sed rate and complements are normal.  Uric acid is normal.  No change in treatment advised.  Please forward labs to her PCP.

## 2021-02-16 NOTE — Progress Notes (Signed)
[facility] Note  02/20/2021     CHIEF COMPLAINT Patient presents for Retina Evaluation   HISTORY OF PRESENT ILLNESS: Susan Daniels is a 65 y.o. female who presents to the clinic today for:   HPI    Retina Evaluation    In both eyes.  I, the attending physician,  performed the HPI with the patient and updated documentation appropriately.          Comments    Patient here for Retina Evaluation. Referred by Dr Estanislado Pandy. Patient states vision doing ok. Normally wears contact lenses. Been on Plaquenil for 17 years.        Last edited by Bernarda Caffey, MD on 02/20/2021 12:05 PM. (History)    pt is here on the referral of Dr. Estanislado Pandy for Plaquenil exam, pt has been on Plaquenil for 16 years, pt used to see Dr. Thom Chimes at [facility], but when he retired she looked for a new dr and was seen at [facility], pt states she has an appt on May 23 with Dr. Prudencio Burly for a regular eye exam, pt is currently taking 200 mg Plaquenil BID 5 days a week, she does not take it on the weekends, pt states her diabetes is under better control right now, she states her last A1c was 8.0, but gets it checked again next month, she was put on Ozempic after the 8.0 reading  Referring physician: Bo Merino, [address],  [place] [address]  HISTORICAL INFORMATION:   Selected notes from the [facility] Referred by Dr. Estanislado Pandy for Plaquenil exam LEE:  Ocular Hx- PMH-    CURRENT MEDICATIONS: No current outpatient medications on file. (Ophthalmic Drugs)   No current facility-administered medications for this visit. (Ophthalmic Drugs)   Current Outpatient Medications (Other)  Medication Sig  . allopurinol (ZYLOPRIM) 100 MG tablet TAKE 1 TABLET BY MOUTH EVERY DAY  . aspirin 81 MG chewable tablet Chew 81 mg by mouth daily.   . B-D ULTRA-FINE 33 LANCETS MISC Use as directed to test blood glucose  . Blood Glucose Monitoring Suppl  (GLUCOCOM BLOOD GLUCOSE MONITOR) DEVI Use for glucose testing as directed  . CALCIUM PO Take by mouth daily.  . Cholecalciferol (VITAMIN D PO) Take by mouth daily.  Marland Kitchen glipiZIDE (GLUCOTROL XL) 5 MG 24 hr tablet Take 5 mg by mouth daily.  . hydroxychloroquine (PLAQUENIL) 200 MG tablet Take 1 tablet by mouth twice daily, Monday through Friday only. (Patient taking differently: Take 200 mg by mouth 2 (two) times daily. Take 1 tablet by mouth twice daily, Monday through Friday only.)  . lisinopril (PRINIVIL,ZESTRIL) 2.5 MG tablet lisinopril 2.5 mg tablet  . metFORMIN (GLUCOPHAGE-XR) 500 MG 24 hr tablet Take 500 mg by mouth in the morning and at bedtime.   . metoprolol tartrate (LOPRESSOR) 25 MG tablet TAKA 1/2 TABLET BY MOUTH TWICE A DAY  . nitroGLYCERIN (NITROSTAT) 0.4 MG SL tablet Place under the tongue.  Glory Rosebush Delica Lancets 54S MISC CHECK BLOOD SUGAR TWICE A DAY DX E11.65 FINGERSTICK 90 DAYS  . ONETOUCH VERIO test strip CHECK BLOOD SUGAR TWICE A DAY PRIOR TO MEAL  . rosuvastatin (CRESTOR) 10 MG tablet Take 10 mg by mouth at bedtime.   . Semaglutide,0.25 or 0.5MG/DOS, (OZEMPIC, 0.25 OR 0.5 MG/DOSE,) 2 MG/1.5ML SOPN INJECT 0.25 MG SUBCUTANEOUS ONCE A WEEK FOR 4 WEEKS THEN 0.5 MG WEEKLY   No current facility-administered medications for this visit. (Other)  REVIEW OF SYSTEMS: ROS    Positive for: Eyes   Last edited by Theodore Demark, COA on 02/20/2021  9:33 AM. (History)       ALLERGIES Allergies  Allergen Reactions  . Aspirin   . Epinephrine   . Hydrocodone Nausea And Vomiting    PAST MEDICAL HISTORY Past Medical History:  Diagnosis Date  . Arthritis    Rheumatoid  . Diabetes mellitus without complication ([place])   . Headache(784.0)    migraines  . Heart murmur    Past Surgical History:  Procedure Laterality Date  . COLONOSCOPY WITH PROPOFOL N/A 01/30/2013   Procedure: COLONOSCOPY WITH PROPOFOL;  Surgeon: Garlan Fair, MD;  Location: [facility];  Service:  Endoscopy;  Laterality: N/A;  . dental implant  09/29/2018  . HEART STENT    . TUBAL LIGATION      FAMILY HISTORY Family History  Problem Relation Age of Onset  . Healthy Mother   . Heart disease Father   . Rheumatic fever Father   . Hypertension Sister   . Rheumatic fever Sister   . Healthy Son   . Healthy Daughter     SOCIAL HISTORY Social History   Tobacco Use  . Smoking status: Former Smoker    Packs/day: 0.50    Years: 7.00    Pack years: 3.50    Types: Cigarettes    Quit date: 11/09/1979    Years since quitting: 41.3  . Smokeless tobacco: Never Used  Vaping Use  . Vaping Use: Never used  Substance Use Topics  . Alcohol use: Yes    Comment: occasionally   . Drug use: Never         OPHTHALMIC EXAM:  Base Eye Exam    Visual Acuity (Snellen - Linear)      Right Left   Dist cc 20/40 20/20 -2   Dist ph cc 20/20 -2    Correction: Glasses       Tonometry (Tonopen, 9:24 AM)      Right Left   Pressure 13 13       Pupils      Dark Light Shape React APD   Right 3 2 Round Brisk None   Left 3 2 Round Brisk None       Visual Fields (Counting fingers)      Left Right    Full Full       Extraocular Movement      Right Left    Full, Ortho Full, Ortho       Neuro/Psych    Oriented x3: Yes   Mood/Affect: Normal       Dilation    Both eyes: 1.0% Mydriacyl, 2.5% Phenylephrine @ 9:24 AM        Slit Lamp and Fundus Exam    Slit Lamp Exam      Right Left   Lids/Lashes Dermatochalasis - upper lid Dermatochalasis - upper lid   Conjunctiva/Sclera White and quiet White and quiet   Cornea trace PEE trace PEE   Anterior Chamber Deep and quiet Deep and quiet   Iris Round and dilated, No NVI Round and dilated, No NVI   Lens 2+ Nuclear sclerosis, 2+ Cortical cataract, 1+ Posterior capsular opacification 2+ Nuclear sclerosis, 2+ Cortical cataract, 1+ Posterior capsular opacification   Vitreous Vitreous syneresis Vitreous syneresis, Posterior vitreous  detachment       Fundus Exam      Right Left   Disc mild Pallor, Sharp rim, temporal PPA Pink  and sharp, PPA   C/D Ratio 0.3 0.3   Macula Flat, Blunted foveal reflex, RPE mottling, No heme or edema Flat, Blunted foveal reflex, RPE mottling, No edema, rare MA SN mac   Vessels attenuated, mild tortuousity attenuated, mild tortuousity   Periphery Attached, no heme, peripheral, pigmented cystoid degeneration Attached, no heme, peripheral, pigmented cystoid degeneration        Refraction    Wearing Rx      Sphere Cylinder Axis Add   Right -2.75 +1.75 157 +1.75   Left -2.75 Sphere  +1.75       Manifest Refraction (Auto)      Sphere Cylinder Axis Dist VA   Right -2.00 +0.75 170 20/20   Left -2.75 +0.50 088 20/20          IMAGING AND PROCEDURES  Imaging and Procedures for 02/20/2021  OCT, Retina - OU - Both Eyes       Right Eye Quality was good. Central Foveal Thickness: 255. Progression has no prior data. Findings include normal foveal contour, no IRF, no SRF (Trace perifoveal ellipsoid thinning ).   Left Eye Quality was good. Central Foveal Thickness: 261. Progression has no prior data. Findings include normal foveal contour, no IRF, no SRF (Trace perifoveal ellipsoid thinning ).   Notes *Images captured and stored on drive  Diagnosis / Impression:  NFP; no IRF/SRF OU Mild parafoveal ellipsoid thinning OU -- early plaquenil toxic maculopathy  Clinical management:  See below  Abbreviations: NFP - Normal foveal profile. CME - cystoid macular edema. PED - pigment epithelial detachment. IRF - intraretinal fluid. SRF - subretinal fluid. EZ - ellipsoid zone. ERM - epiretinal membrane. ORA - outer retinal atrophy. ORT - outer retinal tubulation. SRHM - subretinal hyper-reflective material. IRHM - intraretinal hyper-reflective material                 ASSESSMENT/PLAN:    ICD-10-CM   1. Long-term use of Plaquenil  Z79.899   2. 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 Diabetes mellitus type 2 without retinopathy ([place])  E11.9   4. Retinal edema  H35.81 OCT, Retina - OU - Both Eyes  5. Essential hypertension  I10   6. Hypertensive retinopathy of both eyes  H35.033   7. Combined forms of age-related cataract of both eyes  H25.813     1,2. Plaquenil (hydroxychloroquine [HCQ]) use for RA - mild/early maculopathy noted on OCT -- mild parafoveal ellipsoid thinning - BCVA is 20/20 and pt is essentially asymptomatic - pt is currently taking 400 mg daily (200 mg BID) M-F, but history of 400 mg daily x17 years - pt reports wt is ~92 kg - [(400/92) x 5] / 7 = 3.1 mg/kg/day on avg - the AAO recommends daily dosing of < 5.0 mg/kg for HCQ - as we are seeing early toxic changes in the retina, would recommend stopping plaquenil and switching to another agent for RA - f/u 3-4 weeks, DFE, OCT, HVF 10-2  3,4. Diabetes mellitus, type 2 without retinopathy OU - The incidence, risk factors for progression, natural history and treatment options for diabetic retinopathy  were discussed with patient.   - The need for close monitoring of blood glucose, blood pressure, and serum lipids, avoiding cigarette or any type of tobacco, and the need for long term follow up was also discussed with patient. - f/u in 1 year, sooner prn  5,6. Hypertensive retinopathy OU - discussed importance of tight  BP control - monitor  7. Mixed Cataract OU - The symptoms of cataract, surgical options, and treatments and risks were discussed with patient. - discussed diagnosis and progression - not yet visually significant - monitor for now   Ophthalmic Meds Ordered this visit:  No orders of the defined types were placed in this encounter.      Return for f/u 3-4 weeks, plaquenil exam, DFE, OCT, HVF 10-2.  There are no Patient Instructions on file for this visit.   Explained the diagnoses, plan, and follow up with the patient and they  expressed understanding.  Patient expressed understanding of the importance of proper follow up care.   This document serves as a record of services personally performed by Gardiner Sleeper, MD, PhD. It was created on their behalf by San Jetty. Owens Shark, OA an ophthalmic technician. The creation of this record is the provider's dictation and/or activities during the visit.    Electronically signed by: San Jetty. Owens Shark, [place] 04.11.2022 12:20 PM   Gardiner Sleeper, M.D., Ph.D. Diseases & Surgery of the Retina and Vitreous [facility]  I have reviewed the above documentation for accuracy and completeness, and I agree with the above. Gardiner Sleeper, M.D., Ph.D. 02/20/21 12:20 PM   Abbreviations: M myopia (nearsighted); A astigmatism; H hyperopia (farsighted); P presbyopia; Mrx spectacle prescription;  CTL contact lenses; OD right eye; OS left eye; OU both eyes  XT exotropia; ET esotropia; PEK punctate epithelial keratitis; PEE punctate epithelial erosions; DES dry eye syndrome; MGD meibomian gland dysfunction; ATs artificial tears; PFAT's preservative free artificial tears; [place] nuclear sclerotic cataract; PSC posterior subcapsular cataract; ERM epi-retinal membrane; PVD posterior vitreous detachment; RD retinal detachment; DM diabetes mellitus; DR diabetic retinopathy; NPDR non-proliferative diabetic retinopathy; PDR proliferative diabetic retinopathy; CSME clinically significant macular edema; DME diabetic macular edema; dbh dot blot hemorrhages; CWS cotton wool spot; POAG primary open angle glaucoma; C/D cup-to-disc ratio; HVF humphrey visual field; GVF goldmann visual field; OCT optical coherence tomography; IOP intraocular pressure; BRVO Branch retinal vein occlusion; CRVO central retinal vein occlusion; CRAO central retinal artery occlusion; BRAO branch retinal artery occlusion; RT retinal tear; SB scleral buckle; PPV pars plana vitrectomy; VH Vitreous hemorrhage; PRP panretinal laser  photocoagulation; IVK intravitreal kenalog; VMT vitreomacular traction; MH Macular hole;  NVD neovascularization of the disc; NVE neovascularization elsewhere; AREDS age related eye disease study; ARMD age related macular degeneration; POAG primary open angle glaucoma; EBMD epithelial/anterior basement membrane dystrophy; ACIOL anterior chamber intraocular lens; IOL intraocular lens; PCIOL posterior chamber intraocular lens; Phaco/IOL phacoemulsification with intraocular lens placement; [place] photorefractive keratectomy; LASIK laser assisted in situ keratomileusis; HTN hypertension; DM diabetes mellitus; COPD chronic obstructive pulmonary disease

## 2021-02-17 ENCOUNTER — Telehealth: Payer: Self-pay

## 2021-02-17 NOTE — Telephone Encounter (Signed)
Becky from Dr. Dairl Ponder office called requesting patient's last office visit note faxed to their office [phone]

## 2021-02-17 NOTE — Telephone Encounter (Signed)
faxed

## 2021-02-19 ENCOUNTER — Encounter (INDEPENDENT_AMBULATORY_CARE_PROVIDER_SITE_OTHER): Payer: BLUE CROSS/BLUE SHIELD | Admitting: Ophthalmology

## 2021-02-20 ENCOUNTER — Ambulatory Visit (INDEPENDENT_AMBULATORY_CARE_PROVIDER_SITE_OTHER): Payer: BC Managed Care – PPO | Admitting: Ophthalmology

## 2021-02-20 ENCOUNTER — Other Ambulatory Visit: Payer: Self-pay

## 2021-02-20 ENCOUNTER — Encounter (INDEPENDENT_AMBULATORY_CARE_PROVIDER_SITE_OTHER): Payer: Self-pay | Admitting: Ophthalmology

## 2021-02-20 DIAGNOSIS — Z79899 Other long term (current) drug therapy: Secondary | ICD-10-CM | POA: Diagnosis not present

## 2021-02-20 DIAGNOSIS — H35033 Hypertensive retinopathy, bilateral: Secondary | ICD-10-CM

## 2021-02-20 DIAGNOSIS — H25813 Combined forms of age-related cataract, bilateral: Secondary | ICD-10-CM

## 2021-02-20 DIAGNOSIS — I10 Essential (primary) hypertension: Secondary | ICD-10-CM

## 2021-02-20 DIAGNOSIS — H3581 Retinal edema: Secondary | ICD-10-CM

## 2021-02-20 DIAGNOSIS — E119 Type 2 diabetes mellitus without complications: Secondary | ICD-10-CM

## 2021-02-20 DIAGNOSIS — H35389 Toxic maculopathy, unspecified eye: Secondary | ICD-10-CM | POA: Diagnosis not present

## 2021-02-20 DIAGNOSIS — T372X5A Adverse effect of antimalarials and drugs acting on other blood protozoa, initial encounter: Secondary | ICD-10-CM

## 2021-02-24 ENCOUNTER — Other Ambulatory Visit: Payer: Self-pay | Admitting: *Deleted

## 2021-02-24 NOTE — Progress Notes (Signed)
LMOM for patient, per Dr. Estanislado Pandy discontinue PLQ and return for labs 2 weeks prior to appt 05/05/2021.

## 2021-02-24 NOTE — Progress Notes (Signed)
LMOM for patient to call to discuss medication

## 2021-02-27 NOTE — Progress Notes (Signed)
Office Visit Note  Patient: Susan Daniels             Date of Birth: 12/11/1955           MRN: [id number]             PCP: Josetta Huddle, MD Referring: Josetta Huddle, MD Visit Date: 03/02/2021 Occupation: @GUAROCC @  Subjective:  Medication management.   History of Present Illness: Susan Daniels is a 65 y.o. female with history of autoimmune disease.  She initially presented with inflammatory arthritis and positive ANA and was a started on Plaquenil by Dr. Rockwell Alexandria.  She recently had eye examination done by Dr. Coralyn Pear which showed maculopathy.  She stopped Plaquenil 2 days ago.  She is afraid to stop the medication because she states in the past whenever she came off the medication her arthritis returned.  She has not had a flare of her arthritis in many years.  She states she developed some photosensitivity and rash on Plaquenil but did not have those symptoms initially.  She has not had any flares of gout on allopurinol.  Activities of Daily Living:  Patient reports morning stiffness for 5-10  minutes.   Patient Denies nocturnal pain.  Difficulty dressing/grooming: Denies Difficulty climbing stairs: Denies Difficulty getting out of chair: Denies Difficulty using hands for taps, buttons, cutlery, and/or writing: Denies  Review of Systems  Constitutional: Negative for fatigue.  HENT: Negative for mouth sores, mouth dryness and nose dryness.   Eyes: Negative for pain, itching and dryness.  Respiratory: Negative for shortness of breath and difficulty breathing.   Cardiovascular: Negative for chest pain and palpitations.  Gastrointestinal: Negative for blood in stool, constipation and diarrhea.  Endocrine: Negative for increased urination.  Genitourinary: Negative for difficulty urinating.  Musculoskeletal: Positive for morning stiffness. Negative for arthralgias, joint pain, joint swelling, myalgias, muscle tenderness and myalgias.  Skin: Negative for color change, rash and  redness.  Allergic/Immunologic: Negative for susceptible to infections.  Neurological: Negative for dizziness, numbness, headaches, memory loss and weakness.  Hematological: Negative for bruising/bleeding tendency.  Psychiatric/Behavioral: Negative for confusion.    PMFS History:  Patient Active Problem List   Diagnosis Date Noted  . CAD (coronary artery disease), native coronary artery 06/16/2020  . Diabetes mellitus type 2 with complications ([place]) 89/16/9450  . Autoimmune disease ([place]) 06/03/2017  . High risk medication use 06/03/2017  . Primary osteoarthritis of both hands 06/03/2017  . Primary osteoarthritis of both knees 06/03/2017  . Primary osteoarthritis of both feet 06/03/2017  . Heart murmur 06/03/2017  . History of hypothyroidism 06/03/2017  . Dyslipidemia 06/03/2017  . History of diabetes mellitus 06/03/2017  . Vitamin D deficiency 06/03/2017    Past Medical History:  Diagnosis Date  . Arthritis    Rheumatoid  . Diabetes mellitus without complication ([place])   . Headache(784.0)    migraines  . Heart murmur     Family History  Problem Relation Age of Onset  . Healthy Mother   . Heart disease Father   . Rheumatic fever Father   . Hypertension Sister   . Rheumatic fever Sister   . Healthy Son   . Healthy Daughter    Past Surgical History:  Procedure Laterality Date  . COLONOSCOPY WITH PROPOFOL N/A 01/30/2013   Procedure: COLONOSCOPY WITH PROPOFOL;  Surgeon: Garlan Fair, MD;  Location: [facility];  Service: Endoscopy;  Laterality: N/A;  . dental implant  09/29/2018  . HEART STENT    . TUBAL LIGATION  Social History   Social History Narrative  . Not on file   Immunization History  Administered Date(s) Administered  . PFIZER(Purple Top)SARS-COV-2 Vaccination 01/29/2020, 02/19/2020, 10/10/2020     Objective: Vital Signs: BP 125/78 (BP Location: Left Arm, Patient Position: Sitting, Cuff Size: Normal)   Pulse 78   Resp 14   Ht 5' 2"  (1.575 m)    Wt 197 lb 9.6 oz (89.6 kg)   BMI 36.14 kg/m    Physical Exam Vitals and nursing note reviewed.  Constitutional:      Appearance: She is well-developed.  HENT:     Head: Normocephalic and atraumatic.  Eyes:     Conjunctiva/sclera: Conjunctivae normal.  Cardiovascular:     Rate and Rhythm: Normal rate and regular rhythm.     Heart sounds: Normal heart sounds.  Pulmonary:     Effort: Pulmonary effort is normal.     Breath sounds: Normal breath sounds.  Abdominal:     General: Bowel sounds are normal.     Palpations: Abdomen is soft.  Musculoskeletal:     Cervical back: Normal range of motion.  Lymphadenopathy:     Cervical: No cervical adenopathy.  Skin:    General: Skin is warm and dry.     Capillary Refill: Capillary refill takes less than 2 seconds.  Neurological:     Mental Status: She is alert and oriented to person, place, and time.  Psychiatric:        Behavior: Behavior normal.      Musculoskeletal Exam: C-spine thoracic and lumbar spine with good range of motion.  Shoulder joints, elbow joints, wrist joints, MCPs PIPs and DIPs with good range of motion with no synovitis.  Hip joints, knee joints, ankles, MTPs and PIPs with good range of motion with no synovitis.  CDAI Exam: CDAI Score: -- Patient Global: --; Provider Global: -- Swollen: 0 ; Tender: 0  Joint Exam 03/02/2021   No joint exam has been documented for this visit   There is currently no information documented on the homunculus. Go to the Rheumatology activity and complete the homunculus joint exam.  Investigation: No additional findings.  Imaging: OCT, Retina - OU - Both Eyes  Result Date: 02/20/2021 Right Eye Quality was good. Central Foveal Thickness: 255. Progression has no prior data. Findings include normal foveal contour, no IRF, no SRF (Trace perifoveal ellipsoid thinning ). Left Eye Quality was good. Central Foveal Thickness: 261. Progression has no prior data. Findings include normal  foveal contour, no IRF, no SRF (Trace perifoveal ellipsoid thinning ). Notes *Images captured and stored on drive Diagnosis / Impression: NFP; no IRF/SRF OU Mild parafoveal ellipsoid thinning OU -- early plaquenil toxic maculopathy Clinical management: See below Abbreviations: NFP - Normal foveal profile. CME - cystoid macular edema. PED - pigment epithelial detachment. IRF - intraretinal fluid. SRF - subretinal fluid. EZ - ellipsoid zone. ERM - epiretinal membrane. ORA - outer retinal atrophy. ORT - outer retinal tubulation. SRHM - subretinal hyper-reflective material. IRHM - intraretinal hyper-reflective material    Recent Labs: Lab Results  Component Value Date   WBC 5.2 01/26/2021   HGB 12.6 01/26/2021   PLT 208 01/26/2021   NA 142 01/26/2021   K 4.6 01/26/2021   CL 105 01/26/2021   CO2 26 01/26/2021   GLUCOSE 142 (H) 01/26/2021   BUN 17 01/26/2021   CREATININE 1.05 (H) 01/26/2021   BILITOT 0.7 01/26/2021   ALKPHOS 180 (H) 05/18/2019   AST 15 01/26/2021   ALT 17  01/26/2021   PROT 6.6 01/26/2021   ALBUMIN 3.8 05/18/2019   CALCIUM 9.1 01/26/2021   GFRAA 65 01/26/2021   January 26, 2021 UA negative, ANA positive, dsDNA negative, ESR 11, C3-C4 normal, uric acid 6.6  Speciality Comments: PLQ eye exam: 09/08/2020 [facility] [place] Follow up in 6 months. Abnormal OCT.  Started Plaquenil at age 30  Procedures:  No procedures performed Allergies: Aspirin, Epinephrine, and Hydrocodone   Assessment / Plan:     Visit Diagnoses: Autoimmune disease ([place]) - History of inflammatory arthritis, positive ANA(ENA, C3-C4, anticardiolipin, beta-2, lupus anticoagulant was negative. ANA was 1: 320 speckled), she developed rash and photosensitivity later which she relates to possible hydroxychloroquine use: She was diagnosed Dr. Rockwell [place].  She has been on hydroxychloroquine for 18 years.  She states every time she came off the medication she had flare of her arthritis.  She has not had a flare in many  years.  I discussed coming off hydroxychloroquine completely due to maculopathy.  She is hesitant to come off all the DMARDs.  She wants to start on something else that she had a very bad experience of inflammatory arthritis.  She has mildly elevated creatinine so I will avoid methotrexate.  She is also on allopurinol therefore azathioprine will not be a good choice.  I discussed the option of leflunomide.  Indications side effects contraindications were discussed.  I believe she will do well on low-dose leflunomide.  After discussing indications side effects and regulations we decided to start her on leflunomide 10 mg p.o. daily.  She wants to proceed with the medication.  Handout was given and consent was taken.  Medication counseling:   Baseline Immunosuppressant Therapy Labs     Hepatitis Latest Ref Rng & Units 05/19/2019  Hep B Surface Ag Negative Negative  Hep B IgM Negative Negative  Hep C Ab 0.0 - 0.9 s/co ratio <0.1  Hep A IgM Negative Negative    No results found for: HIV     Serum Protein Electrophoresis Latest Ref Rng & Units 01/26/2021  Total Protein 6.1 - 8.1 g/dL 6.6  Albumin 3.8 - 4.8 g/dL -  Alpha-1 0.2 - 0.3 g/dL -  Alpha-2 0.5 - 0.9 g/dL -  Beta Globulin 0.4 - 0.6 g/dL -  Beta 2 0.2 - 0.5 g/dL -  Gamma Globulin 0.8 - 1.7 g/dL -    No results found for: G6PDH  No results found for: TPMT   Patient was counseled on the purpose, proper use, and adverse effects of leflunomide including risk of infection, nausea/diarrhea/weight loss, increase in blood pressure, rash, hair loss, tingling in the hands and feet, and signs and symptoms of interstitial lung disease.   Also counseled on Black Box warning of liver injury and importance of avoiding alcohol while on therapy. Discussed that there is the possibility of an increased risk of malignancy but it is not well understood if this increased risk is due to the medication or the disease state.  Counseled patient to avoid live  vaccines. Recommend annual influenza, Pneumovax 23, Prevnar 13, and Shingrix as indicated.   Discussed the importance of frequent monitoring of liver function and blood count.   Provided patient with educational materials on leflunomide and answered all questions.  Patient consented to [place] use, and consent will be uploaded into the media tab.    Chest x-ray normal June 05, 2019 IFE normal June 03, 2017 Hepatitis panel - May 19, 2019  High risk medication use - Patient  was advised to discontinue Plaquenil based on the examination by Dr. Coralyn Pear.  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  She was strongly advised to come off hydroxychloroquine.  She was hesitant in the beginning and then she stopped the medication 2 days ago.  Primary osteoarthritis of both hands-tried protection muscle strengthening was discussed.  Primary osteoarthritis of both knees - She had good response to Visco supplement injections given in February 2020.  Primary osteoarthritis of both feet-she is currently not having much discomfort.  Chronic idiopathic gout involving toe of right foot without tophus -  allopurinol 100 mg 1 tablet daily and colchicine 0.6 mg 1 tablet daily as needed. -She has not had any gout flare.  Last uric acid level on January 26, 2021 was 6.6.  Dietary modifications were discussed.  History of vitamin D deficiency  Dyslipidemia-increased risk of heart disease with autoimmune disease was discussed.  Dietary modifications and exercise was emphasized.  Handout was placed in the AVS.  History of diabetes mellitus  Heart murmur  Osteopenia of multiple sites - Apr 02, 2020 DEXA scan showed a T score of -1.4, BMD 0.698 in the right femoral neck.   History of hypothyroidism  Orders: Orders Placed This Encounter  Procedures  . QuantiFERON-TB Gold Plus   Meds ordered this encounter  Medications  . leflunomide (ARAVA) 10 MG  tablet    Sig: Take 1 tablet (10 mg total) by mouth daily.    Dispense:  90 tablet    Refill:  0     Follow-Up Instructions: Return in about 6 weeks (around 04/13/2021) for Autoimmune disease, Gout, Osteoarthritis.   Bo Merino, MD  Note - This record has been created using Editor, commissioning.  Chart creation errors have been sought, but may not always  have been located. Such creation errors do not reflect on  the standard of medical care.

## 2021-03-02 ENCOUNTER — Other Ambulatory Visit: Payer: Self-pay

## 2021-03-02 ENCOUNTER — Ambulatory Visit: Payer: BC Managed Care – PPO | Admitting: Rheumatology

## 2021-03-02 ENCOUNTER — Encounter: Payer: Self-pay | Admitting: Rheumatology

## 2021-03-02 VITALS — BP 125/78 | HR 78 | Resp 14 | Ht 62.0 in | Wt 197.6 lb

## 2021-03-02 DIAGNOSIS — Z79899 Other long term (current) drug therapy: Secondary | ICD-10-CM

## 2021-03-02 DIAGNOSIS — M19041 Primary osteoarthritis, right hand: Secondary | ICD-10-CM

## 2021-03-02 DIAGNOSIS — H35389 Toxic maculopathy, unspecified eye: Secondary | ICD-10-CM | POA: Diagnosis not present

## 2021-03-02 DIAGNOSIS — R7989 Other specified abnormal findings of blood chemistry: Secondary | ICD-10-CM

## 2021-03-02 DIAGNOSIS — M19042 Primary osteoarthritis, left hand: Secondary | ICD-10-CM

## 2021-03-02 DIAGNOSIS — E785 Hyperlipidemia, unspecified: Secondary | ICD-10-CM

## 2021-03-02 DIAGNOSIS — T372X5A Adverse effect of antimalarials and drugs acting on other blood protozoa, initial encounter: Secondary | ICD-10-CM

## 2021-03-02 DIAGNOSIS — M8589 Other specified disorders of bone density and structure, multiple sites: Secondary | ICD-10-CM

## 2021-03-02 DIAGNOSIS — M170 Bilateral primary osteoarthritis of knee: Secondary | ICD-10-CM

## 2021-03-02 DIAGNOSIS — R011 Cardiac murmur, unspecified: Secondary | ICD-10-CM

## 2021-03-02 DIAGNOSIS — M359 Systemic involvement of connective tissue, unspecified: Secondary | ICD-10-CM | POA: Diagnosis not present

## 2021-03-02 DIAGNOSIS — M19072 Primary osteoarthritis, left ankle and foot: Secondary | ICD-10-CM

## 2021-03-02 DIAGNOSIS — M1A0710 Idiopathic chronic gout, right ankle and foot, without tophus (tophi): Secondary | ICD-10-CM

## 2021-03-02 DIAGNOSIS — M19071 Primary osteoarthritis, right ankle and foot: Secondary | ICD-10-CM

## 2021-03-02 DIAGNOSIS — Z111 Encounter for screening for respiratory tuberculosis: Secondary | ICD-10-CM | POA: Diagnosis not present

## 2021-03-02 DIAGNOSIS — Z8639 Personal history of other endocrine, nutritional and metabolic disease: Secondary | ICD-10-CM

## 2021-03-02 MED ORDER — LEFLUNOMIDE 10 MG PO TABS: 10.0000 mg | ORAL_TABLET | Freq: Every day | ORAL | 0 refills | Status: DC

## 2021-03-02 NOTE — Progress Notes (Signed)
Pharmacy Note  Subjective: Patient presents today to the [facility] for follow up office visit with Dr. Estanislado Pandy.  Patient seen by the pharmacist for counseling on leflunomide Jolee Ewing) for autoimmune disease.  She was previously taking hydroxychloroquine since she was 65 y/o and states it has worked well for her but unfortunately mild maculopathy noted on OCT. Recommendation by Dr. Coralyn Pear was to stop hydroxychloroquine.   Objective: CBC    Component Value Date/Time   WBC 5.2 01/26/2021 0000   RBC 4.20 01/26/2021 0000   HGB 12.6 01/26/2021 0000   HCT 38.0 01/26/2021 0000   PLT 208 01/26/2021 0000   MCV 90.5 01/26/2021 0000   MCH 30.0 01/26/2021 0000   MCHC 33.2 01/26/2021 0000   RDW 12.2 01/26/2021 0000   LYMPHSABS 1,331 01/26/2021 0000   MONOABS 0.2 05/18/2019 2038   EOSABS 322 01/26/2021 0000   BASOSABS 88 01/26/2021 0000    CMP     Component Value Date/Time   NA 142 01/26/2021 0000   K 4.6 01/26/2021 0000   CL 105 01/26/2021 0000   CO2 26 01/26/2021 0000   GLUCOSE 142 (H) 01/26/2021 0000   BUN 17 01/26/2021 0000   CREATININE 1.05 (H) 01/26/2021 0000   CALCIUM 9.1 01/26/2021 0000   PROT 6.6 01/26/2021 0000   ALBUMIN 3.8 05/18/2019 2038   AST 15 01/26/2021 0000   ALT 17 01/26/2021 0000   ALKPHOS 180 (H) 05/18/2019 2038   BILITOT 0.7 01/26/2021 0000   GFRNONAA 56 (L) 01/26/2021 0000   GFRAA 65 01/26/2021 0000    Baseline Immunosuppressant Therapy Labs    Hepatitis Latest Ref Rng & Units 05/19/2019  Hep B Surface Ag Negative Negative  Hep B IgM Negative Negative  Hep C Ab 0.0 - 0.9 s/co ratio <0.1  Hep A IgM Negative Negative    No results found for: HIV     Serum Protein Electrophoresis Latest Ref Rng & Units 01/26/2021  Total Protein 6.1 - 8.1 g/dL 6.6  Albumin 3.8 - 4.8 g/dL -  Alpha-1 0.2 - 0.3 g/dL -  Alpha-2 0.5 - 0.9 g/dL -  Beta Globulin 0.4 - 0.6 g/dL -  Beta 2 0.2 - 0.5 g/dL -  Gamma Globulin 0.8 - 1.7 g/dL -    Pregnancy  status:  N/A  Assessment/Plan:  Patient was counseled on the purpose, proper use, and adverse effects of leflunomide including risk of infection, nausea/diarrhea/weight loss, increase in blood pressure, rash, hair loss, tingling in the hands and feet, and signs and symptoms of interstitial lung disease.   Also counseled on Black Box warning of liver injury and importance of avoiding alcohol while on therapy. LFTs normal.  Discussed that there is the possibility of an increased risk of malignancy but it is not well understood if this increased risk is due to the medication or the disease state.  Counseled patient to avoid live vaccines. Recommended zoster vaccine and pneumonia vaccine when she turns 65 y/o. Has received 3 COVID19 vaccines. Advised that she may receive 2nd [place] booster in June.   Discussed the importance of frequent monitoring of liver function and blood count.  Standing orders placed.  Patient is post-menopausal.  Provided patient with educational materials on leflunomide and answered all questions.  Patient consented to [place] use, and consent will be uploaded into the media tab.    Patient dose will be leflunomide 10mg  daily as maintenance.  Prescription sent. Pending TB gold result.

## 2021-03-02 NOTE — Patient Instructions (Addendum)
Leflunomide tablets What is this medicine? LEFLUNOMIDE (le FLOO na mide) is for rheumatoid arthritis. This medicine may be used for other purposes; ask your health care provider or pharmacist if you have questions. COMMON BRAND NAME(S): Arava What should I tell my health care provider before I take this medicine? They need to know if you have any of these conditions:  diabetes  have a fever or infection  high blood pressure  immune system problems  kidney disease  liver disease  low blood cell counts, like low white cell, platelet, or red cell counts  lung or breathing disease, like asthma  recently received or scheduled to receive a vaccine  receiving treatment for cancer  skin conditions or sensitivity  tingling of the fingers or toes, or other nerve disorder  tuberculosis  an unusual or allergic reaction to leflunomide, teriflunomide, other medicines, food, dyes, or preservatives  pregnant or trying to get pregnant  breast-feeding How should I use this medicine? Take this medicine by mouth with a full glass of water. Follow the directions on the prescription label. Take your medicine at regular intervals. Do not take your medicine more often than directed. Do not stop taking except on your doctor's advice. Talk to your pediatrician regarding the use of this medicine in children. Special care may be needed. Overdosage: If you think you have taken too much of this medicine contact a poison control center or emergency room at once. NOTE: This medicine is only for you. Do not share this medicine with others. What if I miss a dose? If you miss a dose, take it as soon as you can. If it is almost time for your next dose, take only that dose. Do not take double or extra doses. What may interact with this medicine? Do not take this medicine with any of the following medications:  teriflunomide This medicine may also interact with the following  medications:  alosetron  birth control pills  caffeine  cefaclor  certain medicines for diabetes like nateglinide, repaglinide, rosiglitazone, pioglitazone  certain medicines for high cholesterol like atorvastatin, pravastatin, rosuvastatin, simvastatin  charcoal  cholestyramine  ciprofloxacin  duloxetine  furosemide  ketoprofen  live virus vaccines  medicines that increase your risk for infection  methotrexate  mitoxantrone  paclitaxel  penicillin  theophylline  tizanidine  warfarin This list may not describe all possible interactions. Give your health care provider a list of all the medicines, herbs, non-prescription drugs, or dietary supplements you use. Also tell them if you smoke, drink alcohol, or use illegal drugs. Some items may interact with your medicine. What should I watch for while using this medicine? Visit your health care provider for regular checks on your progress. Tell your doctor or health care provider if your symptoms do not start to get better or if they get worse. You may need blood work done while you are taking this medicine. This medicine may cause serious skin reactions. They can happen weeks to months after starting the medicine. Contact your health care provider right away if you notice fevers or flu-like symptoms with a rash. The rash may be red or purple and then turn into blisters or peeling of the skin. Or, you might notice a red rash with swelling of the face, lips or lymph nodes in your neck or under your arms. This medicine may stay in your body for up to 2 years after your last dose. Tell your doctor about any unusual side effects or symptoms. A medicine can  be given to help lower your blood levels of this medicine more quickly. Women must use effective birth control with this medicine. There is a potential for serious side effects to an unborn child. Do not become pregnant while taking this medicine. Inform your doctor if you wish  to become pregnant. This medicine remains in your blood after you stop taking it. You must continue using effective birth control until the blood levels have been checked and they are low enough. A medicine can be given to help lower your blood levels of this medicine more quickly. Immediately talk to your doctor if you think you may be pregnant. You may need a pregnancy test. Talk to your health care provider or pharmacist for more information. You should not receive certain vaccines during your treatment and for a certain time after your treatment with this medication ends. Talk to your health care provider for more information. What side effects may I notice from receiving this medicine? Side effects that you should report to your doctor or health care professional as soon as possible:  allergic reactions like skin rash, itching or hives, swelling of the face, lips, or tongue  breathing problems  cough  increased blood pressure  low blood counts - this medicine may decrease the number of white blood cells and platelets. You may be at increased risk for infections and bleeding.  pain, tingling, numbness in the hands or feet  rash, fever, and swollen lymph nodes  redness, blistering, peeing or loosening of the skin, including inside the mouth  signs of decreased platelets or bleeding - bruising, pinpoint red spots on the skin, black, tarry stools, blood in urine  signs of infection - fever or chills, cough, sore throat, pain or trouble passing urine  signs and symptoms of liver injury like dark yellow or brown urine; general ill feeling or flu-like symptoms; light-colored stools; loss of appetite; nausea; right upper belly pain; unusually weak or tired; yellowing of the eyes or skin  trouble passing urine or change in the amount of urine  vomiting Side effects that usually do not require medical attention (report to your doctor or health care professional if they continue or are  bothersome):  diarrhea  hair thinning or loss  headache  nausea  tiredness This list may not describe all possible side effects. Call your doctor for medical advice about side effects. You may report side effects to FDA at 1-800-FDA-1088. Where should I keep my medicine? Keep out of the reach of children. Store at room temperature between 15 and 30 degrees C (59 and 86 degrees F). Protect from moisture and light. Throw away any unused medicine after the expiration date. NOTE: This sheet is a summary. It may not cover all possible information. If you have questions about this medicine, talk to your doctor, pharmacist, or health care provider.  2021 Elsevier/Gold Standard (2019-01-26 15:06:48)  Standing Labs We placed an order today for your standing lab work.   Please have your standing labs drawn in 1 month after starting Armona and then every 3 months.  If possible, please have your labs drawn 2 weeks prior to your appointment so that the provider can discuss your results at your appointment.  We have open lab daily Monday through Thursday from 1:30-4:30 PM and Friday from 1:30-4:00 PM at the office of Dr. Bo Merino, [facility].   Please be advised, all patients with office appointments requiring lab work will take precedents over walk-in lab work.  If possible, please come for your lab work on Monday and Friday afternoons, as you may experience shorter wait times. The office is located at [address] [place], [place], [place] [address] No appointment is necessary.   Labs are drawn by [facility]. Please bring your co-pay at the time of your lab draw.  You may receive a bill from [facility] for your lab work.  If you wish to have your labs drawn at another location, please call the office 24 hours in advance to send orders.  If you have any questions regarding directions or hours of operation,  please call [phone].   As a reminder, please drink plenty of  water prior to coming for your lab work. Thanks!   Vaccines You are taking a medication(s) that can suppress your immune system.  The following immunizations are recommended: . Flu annually . Covid-19  . Pneumonia (Pneumovax 23 and Prevnar 13 spaced at least 1 year apart) . Shingrix (after age 75)  Please check with your PCP to make sure you are up to date.   Heart Disease Prevention   Your inflammatory disease increases your risk of heart disease which includes heart attack, stroke, atrial fibrillation (irregular heartbeats), high blood pressure, heart failure and atherosclerosis (plaque in the arteries).  It is important to reduce your risk by:   . Keep blood pressure, cholesterol, and blood sugar at healthy levels   . Smoking Cessation   . Maintain a healthy weight  o BMI 20-25   . Eat a healthy diet  o Plenty of fresh fruit, vegetables, and whole grains  o Limit saturated fats, foods high in sodium, and added sugars  o DASH and Mediterranean diet   . Increase physical activity  o Recommend moderate physically activity for 150 minutes per week/ 30 minutes a day for five days a week These can be broken up into three separate ten-minute sessions during the day.   . Reduce Stress  . Meditation, slow breathing exercises, yoga, coloring books  . Dental visits twice a year

## 2021-03-04 LAB — QUANTIFERON-TB GOLD PLUS
Mitogen-NIL: >10 IU/mL
NIL: 0.02 IU/mL
QuantiFERON-TB Gold Plus: NEGATIVE
TB1-NIL: 0 IU/mL
TB2-NIL: 0 IU/mL

## 2021-03-13 ENCOUNTER — Encounter (INDEPENDENT_AMBULATORY_CARE_PROVIDER_SITE_OTHER): Payer: BC Managed Care – PPO | Admitting: Ophthalmology

## 2021-03-30 DIAGNOSIS — Z79899 Other long term (current) drug therapy: Secondary | ICD-10-CM | POA: Diagnosis not present

## 2021-03-30 DIAGNOSIS — H2513 Age-related nuclear cataract, bilateral: Secondary | ICD-10-CM | POA: Diagnosis not present

## 2021-03-30 DIAGNOSIS — H5213 Myopia, bilateral: Secondary | ICD-10-CM | POA: Diagnosis not present

## 2021-03-30 NOTE — Progress Notes (Deleted)
Office Visit Note  Patient: Susan Daniels             Date of Birth: 02-Jul-1956           MRN: [id number]             PCP: Josetta Huddle, MD Referring: Josetta Huddle, MD Visit Date: 04/13/2021 Occupation: @GUAROCC @  Subjective:  No chief complaint on file.   History of Present Illness: Susan Daniels is a 65 y.o. female ***   Activities of Daily Living:  Patient reports morning stiffness for *** {minute/hour:19697}.   Patient {ACTIONS;DENIES/REPORTS:21021675::"Denies"} nocturnal pain.  Difficulty dressing/grooming: {ACTIONS;DENIES/REPORTS:21021675::"Denies"} Difficulty climbing stairs: {ACTIONS;DENIES/REPORTS:21021675::"Denies"} Difficulty getting out of chair: {ACTIONS;DENIES/REPORTS:21021675::"Denies"} Difficulty using hands for taps, buttons, cutlery, and/or writing: {ACTIONS;DENIES/REPORTS:21021675::"Denies"}  No Rheumatology ROS completed.   PMFS History:  Patient Active Problem List   Diagnosis Date Noted  . CAD (coronary artery disease), native coronary artery 06/16/2020  . Diabetes mellitus type 2 with complications ([place]) 76/73/4193  . Autoimmune disease ([place]) 06/03/2017  . High risk medication use 06/03/2017  . Primary osteoarthritis of both hands 06/03/2017  . Primary osteoarthritis of both knees 06/03/2017  . Primary osteoarthritis of both feet 06/03/2017  . Heart murmur 06/03/2017  . History of hypothyroidism 06/03/2017  . Dyslipidemia 06/03/2017  . History of diabetes mellitus 06/03/2017  . Vitamin D deficiency 06/03/2017    Past Medical History:  Diagnosis Date  . Arthritis    Rheumatoid  . Diabetes mellitus without complication ([place])   . Headache(784.0)    migraines  . Heart murmur     Family History  Problem Relation Age of Onset  . Healthy Mother   . Heart disease Father   . Rheumatic fever Father   . Hypertension Sister   . Rheumatic fever Sister   . Healthy Son   . Healthy Daughter    Past Surgical History:  Procedure  Laterality Date  . COLONOSCOPY WITH PROPOFOL N/A 01/30/2013   Procedure: COLONOSCOPY WITH PROPOFOL;  Surgeon: Garlan Fair, MD;  Location: [facility];  Service: Endoscopy;  Laterality: N/A;  . dental implant  09/29/2018  . HEART STENT    . TUBAL LIGATION     Social History   Social History Narrative  . Not on file   Immunization History  Administered Date(s) Administered  . PFIZER(Purple Top)SARS-COV-2 Vaccination 01/29/2020, 02/19/2020, 10/10/2020     Objective: Vital Signs: There were no vitals taken for this visit.   Physical Exam   Musculoskeletal Exam: ***  CDAI Exam: CDAI Score: -- Patient Global: --; Provider Global: -- Swollen: --; Tender: -- Joint Exam 04/13/2021   No joint exam has been documented for this visit   There is currently no information documented on the homunculus. Go to the Rheumatology activity and complete the homunculus joint exam.  Investigation: No additional findings.  Imaging: No results found.  Recent Labs: Lab Results  Component Value Date   WBC 5.2 01/26/2021   HGB 12.6 01/26/2021   PLT 208 01/26/2021   NA 142 01/26/2021   K 4.6 01/26/2021   CL 105 01/26/2021   CO2 26 01/26/2021   GLUCOSE 142 (H) 01/26/2021   BUN 17 01/26/2021   CREATININE 1.05 (H) 01/26/2021   BILITOT 0.7 01/26/2021   ALKPHOS 180 (H) 05/18/2019   AST 15 01/26/2021   ALT 17 01/26/2021   PROT 6.6 01/26/2021   ALBUMIN 3.8 05/18/2019   CALCIUM 9.1 01/26/2021   GFRAA 65 01/26/2021   QFTBGOLDPLUS NEGATIVE 03/02/2021  Speciality Comments: PLQ eye exam: 09/08/2020 [facility] [place] Follow up in 6 months. Abnormal OCT.  Started Plaquenil at age 58  Procedures:  No procedures performed Allergies: Aspirin, Epinephrine, and Hydrocodone   Assessment / Plan:     Visit Diagnoses: No diagnosis found.  Orders: No orders of the defined types were placed in this encounter.  No orders of the defined types were placed in this  encounter.   Face-to-face time spent with patient was *** minutes. Greater than 50% of time was spent in counseling and coordination of care.  Follow-Up Instructions: No follow-ups on file.   Earnestine Mealing, CMA  Note - This record has been created using Editor, commissioning.  Chart creation errors have been sought, but may not always  have been located. Such creation errors do not reflect on  the standard of medical care.

## 2021-04-09 DIAGNOSIS — I219 Acute myocardial infarction, unspecified: Secondary | ICD-10-CM | POA: Diagnosis not present

## 2021-04-09 DIAGNOSIS — E782 Mixed hyperlipidemia: Secondary | ICD-10-CM | POA: Diagnosis not present

## 2021-04-09 DIAGNOSIS — R7989 Other specified abnormal findings of blood chemistry: Secondary | ICD-10-CM | POA: Diagnosis not present

## 2021-04-09 DIAGNOSIS — Z23 Encounter for immunization: Secondary | ICD-10-CM | POA: Diagnosis not present

## 2021-04-09 DIAGNOSIS — Z79899 Other long term (current) drug therapy: Secondary | ICD-10-CM | POA: Diagnosis not present

## 2021-04-09 DIAGNOSIS — Z0000 Encounter for general adult medical examination without abnormal findings: Secondary | ICD-10-CM | POA: Diagnosis not present

## 2021-04-09 DIAGNOSIS — E559 Vitamin D deficiency, unspecified: Secondary | ICD-10-CM | POA: Diagnosis not present

## 2021-04-09 DIAGNOSIS — E1165 Type 2 diabetes mellitus with hyperglycemia: Secondary | ICD-10-CM | POA: Diagnosis not present

## 2021-04-09 DIAGNOSIS — M359 Systemic involvement of connective tissue, unspecified: Secondary | ICD-10-CM | POA: Diagnosis not present

## 2021-04-09 DIAGNOSIS — M109 Gout, unspecified: Secondary | ICD-10-CM | POA: Diagnosis not present

## 2021-04-10 ENCOUNTER — Other Ambulatory Visit: Payer: Self-pay | Admitting: Internal Medicine

## 2021-04-10 DIAGNOSIS — R030 Elevated blood-pressure reading, without diagnosis of hypertension: Secondary | ICD-10-CM

## 2021-04-10 DIAGNOSIS — I358 Other nonrheumatic aortic valve disorders: Secondary | ICD-10-CM

## 2021-04-10 DIAGNOSIS — E782 Mixed hyperlipidemia: Secondary | ICD-10-CM

## 2021-04-10 DIAGNOSIS — I219 Acute myocardial infarction, unspecified: Secondary | ICD-10-CM

## 2021-04-13 ENCOUNTER — Ambulatory Visit: Payer: BC Managed Care – PPO | Admitting: Physician Assistant

## 2021-04-13 DIAGNOSIS — Z79899 Other long term (current) drug therapy: Secondary | ICD-10-CM

## 2021-04-13 DIAGNOSIS — M19041 Primary osteoarthritis, right hand: Secondary | ICD-10-CM

## 2021-04-13 DIAGNOSIS — M359 Systemic involvement of connective tissue, unspecified: Secondary | ICD-10-CM

## 2021-04-13 DIAGNOSIS — Z8639 Personal history of other endocrine, nutritional and metabolic disease: Secondary | ICD-10-CM

## 2021-04-13 DIAGNOSIS — M8589 Other specified disorders of bone density and structure, multiple sites: Secondary | ICD-10-CM

## 2021-04-13 DIAGNOSIS — M1A0710 Idiopathic chronic gout, right ankle and foot, without tophus (tophi): Secondary | ICD-10-CM

## 2021-04-13 DIAGNOSIS — E785 Hyperlipidemia, unspecified: Secondary | ICD-10-CM

## 2021-04-13 DIAGNOSIS — M170 Bilateral primary osteoarthritis of knee: Secondary | ICD-10-CM

## 2021-04-13 DIAGNOSIS — R011 Cardiac murmur, unspecified: Secondary | ICD-10-CM

## 2021-04-13 DIAGNOSIS — M19072 Primary osteoarthritis, left ankle and foot: Secondary | ICD-10-CM

## 2021-04-13 DIAGNOSIS — H35389 Toxic maculopathy, unspecified eye: Secondary | ICD-10-CM

## 2021-04-20 ENCOUNTER — Other Ambulatory Visit: Payer: Self-pay | Admitting: Physician Assistant

## 2021-04-20 NOTE — Telephone Encounter (Signed)
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  Okay to refill Allopurinol?

## 2021-04-21 ENCOUNTER — Inpatient Hospital Stay: Admission: RE | Admit: 2021-04-21 | Payer: BC Managed Care – PPO | Source: Ambulatory Visit

## 2021-04-21 NOTE — Progress Notes (Signed)
Office Visit Note  Patient: Susan Daniels             Date of Birth: [birth year]           MRN: [id number]             PCP: Josetta Huddle, MD Referring: Josetta Huddle, MD Visit Date: 05/05/2021 Occupation: @GUAROCC @  Subjective:  Medication management   History of Present Illness: Susan Daniels is a 65 y.o. female with a history of autoimmune disease, osteoarthritis and gouty arthropathy.  She has been doing well on leflunomide 10 mg p.o. daily.  She denies any history of inflammation, oral ulcers, nasal ulcers, malar rash, photosensitivity, lymphadenopathy or Raynaud's phenomenon.  She continues to have some stiffness in her joints with increased activities due to osteoarthritis.  She has not had a gout flare in a long time.  She has been taking allopurinol on a regular basis.  She did not have to use colchicine.  She states she has been exercising and watching her diet and had some intentional weight loss.  Activities of Daily Living:  Patient reports morning stiffness for 0 minutes.   Patient Denies nocturnal pain.  Difficulty dressing/grooming: Denies Difficulty climbing stairs: Reports Difficulty getting out of chair: Denies Difficulty using hands for taps, buttons, cutlery, and/or writing: Denies  Review of Systems  Constitutional:  Negative for fatigue.  HENT:  Negative for mouth sores, mouth dryness and nose dryness.   Eyes:  Negative for pain, itching and dryness.  Respiratory:  Negative for shortness of breath and difficulty breathing.   Cardiovascular:  Negative for chest pain and palpitations.  Gastrointestinal:  Negative for blood in stool, constipation and diarrhea.  Endocrine: Negative for increased urination.  Genitourinary:  Negative for difficulty urinating.  Musculoskeletal:  Negative for joint pain, joint pain, joint swelling, myalgias, morning stiffness, muscle tenderness and myalgias.  Skin:  Negative for color change, rash and redness.   Allergic/Immunologic: Negative for susceptible to infections.  Neurological:  Negative for dizziness, numbness, headaches, memory loss and weakness.  Hematological:  Negative for bruising/bleeding tendency.  Psychiatric/Behavioral:  Negative for confusion.    PMFS History:  Patient Active Problem List   Diagnosis Date Noted   CAD (coronary artery disease), native coronary artery 06/16/2020   Diabetes mellitus type 2 with complications ([place]) 92/09/9416   Autoimmune disease ([place]) 06/03/2017   High risk medication use 06/03/2017   Primary osteoarthritis of both hands 06/03/2017   Primary osteoarthritis of both knees 06/03/2017   Primary osteoarthritis of both feet 06/03/2017   Heart murmur 06/03/2017   History of hypothyroidism 06/03/2017   Dyslipidemia 06/03/2017   History of diabetes mellitus 06/03/2017   Vitamin D deficiency 06/03/2017    Past Medical History:  Diagnosis Date   Arthritis    Rheumatoid   Diabetes mellitus without complication ([facility])    EYCXKGYJ(856.3)    migraines   Heart murmur     Family History  Problem Relation Age of Onset   Healthy Mother    Heart disease Father    Rheumatic fever Father    Hypertension Sister    Rheumatic fever Sister    Healthy Son    Healthy Daughter    Past Surgical History:  Procedure Laterality Date   COLONOSCOPY WITH PROPOFOL N/A 01/30/2013   Procedure: COLONOSCOPY WITH PROPOFOL;  Surgeon: Garlan Fair, MD;  Location: [facility];  Service: Endoscopy;  Laterality: N/A;   dental implant  09/29/2018   HEART STENT  TUBAL LIGATION     Social History   Social History Narrative   Not on file   Immunization History  Administered Date(s) Administered   PFIZER(Purple Top)SARS-COV-2 Vaccination 01/29/2020, 02/19/2020, 10/10/2020     Objective: Vital Signs: BP 130/80 (BP Location: Left Arm, Patient Position: Sitting, Cuff Size: Normal)   Pulse 84   Resp 14   Ht 5\' 2"  (1.575 m)   Wt 184 lb 3.2 oz (83.6 kg)   BMI  33.69 kg/m    Physical Exam Vitals and nursing note reviewed.  Constitutional:      Appearance: She is well-developed.  HENT:     Head: Normocephalic and atraumatic.  Eyes:     Conjunctiva/sclera: Conjunctivae normal.  Cardiovascular:     Rate and Rhythm: Normal rate and regular rhythm.     Heart sounds: Normal heart sounds.  Pulmonary:     Effort: Pulmonary effort is normal.     Breath sounds: Normal breath sounds.  Abdominal:     General: Bowel sounds are normal.     Palpations: Abdomen is soft.  Musculoskeletal:     Cervical back: Normal range of motion.  Lymphadenopathy:     Cervical: No cervical adenopathy.  Skin:    General: Skin is warm and dry.     Capillary Refill: Capillary refill takes less than 2 seconds.  Neurological:     Mental Status: She is alert and oriented to person, place, and time.  Psychiatric:        Behavior: Behavior normal.     Musculoskeletal Exam: C-spine was in good range of motion.  Shoulder joints, elbow joints, wrist joints with good range of motion.  She had bilateral PIP and DIP thickening with no synovitis.  Hip joints and knee joints with good range of motion without any warmth swelling or effusion.  She had no tenderness over ankles or MTPs.  CDAI Exam: CDAI Score: -- Patient Global: --; Provider Global: -- Swollen: --; Tender: -- Joint Exam 05/05/2021   No joint exam has been documented for this visit   There is currently no information documented on the homunculus. Go to the Rheumatology activity and complete the homunculus joint exam.  Investigation: No additional findings.  Imaging: No results found.  Recent Labs: Lab Results  Component Value Date   WBC 5.2 01/26/2021   HGB 12.6 01/26/2021   PLT 208 01/26/2021   NA 142 01/26/2021   K 4.6 01/26/2021   CL 105 01/26/2021   CO2 26 01/26/2021   GLUCOSE 142 (H) 01/26/2021   BUN 17 01/26/2021   CREATININE 1.05 (H) 01/26/2021   BILITOT 0.7 01/26/2021   ALKPHOS 180 (H)  05/18/2019   AST 15 01/26/2021   ALT 17 01/26/2021   PROT 6.6 01/26/2021   ALBUMIN 3.8 05/18/2019   CALCIUM 9.1 01/26/2021   GFRAA 65 01/26/2021   QFTBGOLDPLUS NEGATIVE 03/02/2021    Speciality Comments: PLQ eye exam: 09/08/2020 WNL [place] Follow up in 6 months. Abnormal OCT.  Started Plaquenil at age 31  Procedures:  No procedures performed Allergies: Aspirin, Epinephrine, and Hydrocodone   Assessment / Plan:     Visit Diagnoses: Autoimmune disease ([place]) - History of inflammatory arthritis, positive ANA(ENA, C3-C4, anticardiolipin, beta-2, lupus anticoagulant was negative. ANA was 1: 320 speckled -she is clinically doing well with no synovitis on my examination.  She denies any history of oral ulcers, nasal ulcers, malar rash, photosensitivity, sicca symptoms, Raynaud's phenomenon or lymphadenopathy.  She has been tolerating leflunomide well.  I will obtain autoimmune labs today.  Her labs have been stable.  Plan: CBC with Differential/Platelet, COMPLETE METABOLIC PANEL WITH GFR, Urinalysis, Routine w reflex microscopic, Anti-DNA antibody, double-stranded, C3 and C4, Sedimentation rate  High risk medication use - leflunomide 10 mg p.o. daily.  - Plan: CBC with Differential/Platelet, COMPLETE METABOLIC PANEL WITH GFR.  Her creatinine was mildly elevated in March.  We will recheck labs today.  She was advised to stop leflunomide in case she develops an infection.  She will resume the medication once the infection resolves.  She has had 3 doses of COVID-19 vaccine.  A future booster dose was discussed.  Information regarding all the vaccines were placed in the AVS.  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 Coralyn Pear.  Primary osteoarthritis of both hands-she has a stiffness in her hands only with increased activity.  She had no synovitis on examination.  Primary osteoarthritis of both knees -  She had good response to Visco supplement injections given in February 2020.  Primary osteoarthritis of both feet  Osteopenia of multiple sites - Apr 02, 2020 DEXA scan showed a T score of -1.4, BMD 0-proper fitting shoes were discussed.  .698 in the right femoral neck.  Use of calcium rich diet with vitamin D and resistive exercises were emphasized.  History of vitamin D deficiency-she is on vitamin D 1000 units a day.  Chronic idiopathic gout involving toe of right foot without tophus - allopurinol 100 mg 1 tablet daily and colchicine 0.6 mg 1 tablet daily as needed. uric acid: 01/26/2021 6.6 -she denies having any gout flare.  Low purine diet was discussed.  Plan: Uric acid  Dyslipidemia-increased risk of heart disease with autoimmune disease was also discussed.  Dietary modifications and exercise was emphasized and the handout was placed in the AVS.  Heart murmur  History of diabetes mellitus  History of hypothyroidism  Orders: Orders Placed This Encounter  Procedures   CBC with Differential/Platelet   COMPLETE METABOLIC PANEL WITH GFR   Urinalysis, Routine w reflex microscopic   Anti-DNA antibody, double-stranded   C3 and C4   Sedimentation rate   Uric acid   No orders of the defined types were placed in this encounter.    Follow-Up Instructions: Return in about 5 months (around 10/05/2021) for Autoimmune disease.   Bo Merino, MD  Note - This record has been created using Editor, commissioning.  Chart creation errors have been sought, but may not always  have been located. Such creation errors do not reflect on  the standard of medical care.

## 2021-05-05 ENCOUNTER — Ambulatory Visit (INDEPENDENT_AMBULATORY_CARE_PROVIDER_SITE_OTHER): Payer: BC Managed Care – PPO | Admitting: Rheumatology

## 2021-05-05 ENCOUNTER — Other Ambulatory Visit: Payer: Self-pay

## 2021-05-05 ENCOUNTER — Encounter: Payer: Self-pay | Admitting: Rheumatology

## 2021-05-05 VITALS — BP 130/80 | HR 84 | Resp 14 | Ht 62.0 in | Wt 184.2 lb

## 2021-05-05 DIAGNOSIS — R011 Cardiac murmur, unspecified: Secondary | ICD-10-CM

## 2021-05-05 DIAGNOSIS — M170 Bilateral primary osteoarthritis of knee: Secondary | ICD-10-CM

## 2021-05-05 DIAGNOSIS — M359 Systemic involvement of connective tissue, unspecified: Secondary | ICD-10-CM

## 2021-05-05 DIAGNOSIS — M19072 Primary osteoarthritis, left ankle and foot: Secondary | ICD-10-CM

## 2021-05-05 DIAGNOSIS — Z79899 Other long term (current) drug therapy: Secondary | ICD-10-CM | POA: Diagnosis not present

## 2021-05-05 DIAGNOSIS — M19041 Primary osteoarthritis, right hand: Secondary | ICD-10-CM | POA: Diagnosis not present

## 2021-05-05 DIAGNOSIS — Z8639 Personal history of other endocrine, nutritional and metabolic disease: Secondary | ICD-10-CM

## 2021-05-05 DIAGNOSIS — T372X5A Adverse effect of antimalarials and drugs acting on other blood protozoa, initial encounter: Secondary | ICD-10-CM

## 2021-05-05 DIAGNOSIS — H35389 Toxic maculopathy, unspecified eye: Secondary | ICD-10-CM

## 2021-05-05 DIAGNOSIS — M19042 Primary osteoarthritis, left hand: Secondary | ICD-10-CM

## 2021-05-05 DIAGNOSIS — M19071 Primary osteoarthritis, right ankle and foot: Secondary | ICD-10-CM

## 2021-05-05 DIAGNOSIS — M1A0710 Idiopathic chronic gout, right ankle and foot, without tophus (tophi): Secondary | ICD-10-CM | POA: Diagnosis not present

## 2021-05-05 DIAGNOSIS — M8589 Other specified disorders of bone density and structure, multiple sites: Secondary | ICD-10-CM

## 2021-05-05 DIAGNOSIS — E785 Hyperlipidemia, unspecified: Secondary | ICD-10-CM

## 2021-05-05 NOTE — Patient Instructions (Signed)
Standing Labs We placed an order today for your standing lab work.   Please have your standing labs drawn in September and every 3 months  If possible, please have your labs drawn 2 weeks prior to your appointment so that the provider can discuss your results at your appointment.  Please note that you may see your imaging and lab results in MyChart before we have reviewed them. We may be awaiting multiple results to interpret others before contacting you. Please allow our office up to 72 hours to thoroughly review all of the results before contacting the office for clarification of your results.  We have open lab daily: Monday through Thursday from 1:30-4:30 PM and Friday from 1:30-4:00 PM at the office of Dr. Layne Lebon, [facility].   Please be advised, all patients with office appointments requiring lab work will take precedent over walk-in lab work.  If possible, please come for your lab work on Monday and Friday afternoons, as you may experience shorter wait times. The office is located at [address], [address], [place], [place] [address] No appointment is necessary.   Labs are drawn by [facility]. Please bring your co-pay at the time of your lab draw.  You may receive a bill from [facility] for your lab work.  If you wish to have your labs drawn at another location, please call the office 24 hours in advance to send orders.  If you have any questions regarding directions or hours of operation,  please call [phone].   As a reminder, please drink plenty of water prior to coming for your lab work. Thanks!   Vaccines You are taking a medication(s) that can suppress your immune system.  The following immunizations are recommended: Flu annually Covid-19  Td/Tdap (tetanus, diphtheria, pertussis) every 10 years Pneumonia (Prevnar 15 then Pneumovax 23 at least 1 year apart.  Alternatively, can take Prevnar 20 without needing additional dose) Shingrix (after age 50): 2  doses from 4 weeks to 6 months apart  Please check with your PCP to make sure you are up to date.   If you test POSITIVE for COVID19 and have MILD to MODERATE symptoms: First, call your PCP if you would like to receive COVID19 treatment AND Hold your medications during the infection and for at least 1 week after your symptoms have resolved: Injectable medication (Benlysta, Cimzia, Cosentyx, Enbrel, Humira, Orencia, Remicade, Simponi, Stelara, Taltz, Tremfya) Methotrexate Leflunomide (Arava) Mycophenolate (Cellcept) Xeljanz, Olumiant, or Rinvoq If you take Actemra or Kevzara, you DO NOT need to hold these for COVID19 infection.  If you test POSITIVE for COVID19 and have NO symptoms: First, call your PCP if you would like to receive COVID19 treatment AND Hold your medications for at least 10 days after the day that you tested positive Injectable medication (Benlysta, Cimzia, Cosentyx, Enbrel, Humira, Orencia, Remicade, Simponi, Stelara, Taltz, Tremfya) Methotrexate Leflunomide (Arava) Mycophenolate (Cellcept) Xeljanz, Olumiant, or Rinvoq If you take Actemra or Kevzara, you DO NOT need to hold these for COVID19 infection.  If you have signs or symptoms of an infection or start antibiotics: First, call your PCP for workup of your infection. Hold your medication through the infection, until you complete your antibiotics, and until symptoms resolve if you take the following: Injectable medication (Actemra, Benlysta, Cimzia, Cosentyx, Enbrel, Humira, Kevzara, Orencia, Remicade, Simponi, Stelara, Taltz, Tremfya) Methotrexate Leflunomide (Arava) Mycophenolate (Cellcept) Xeljanz, Olumiant, or Rinvoq  Heart Disease Prevention   Your inflammatory disease increases your risk of heart disease which includes   heart attack, stroke, atrial fibrillation (irregular heartbeats), high blood pressure, heart failure and atherosclerosis (plaque in the arteries).  It is important to reduce your risk by:    Keep blood pressure, cholesterol, and blood sugar at healthy levels   Smoking Cessation   Maintain a healthy weight  BMI 20-25   Eat a healthy diet  Plenty of fresh fruit, vegetables, and whole grains  Limit saturated fats, foods high in sodium, and added sugars  DASH and Mediterranean diet   Increase physical activity  Recommend moderate physically activity for 150 minutes per week/ 30 minutes a day for five days a week These can be broken up into three separate ten-minute sessions during the day.   Reduce Stress  Meditation, slow breathing exercises, yoga, coloring books  Dental visits twice a year   

## 2021-05-06 LAB — COMPLETE METABOLIC PANEL WITH GFR
AG Ratio: 1.8 (calc) (ref 1.0–2.5)
ALT: 15 U/L (ref 6–29)
AST: 12 U/L (ref 10–35)
Albumin: 4.2 g/dL (ref 3.6–5.1)
Alkaline phosphatase (APISO): 63 U/L (ref 37–153)
BUN/Creatinine Ratio: 18 (calc) (ref 6–22)
BUN: 18 mg/dL (ref 7–25)
CO2: 28 mmol/L (ref 20–32)
Calcium: 9.2 mg/dL (ref 8.6–10.4)
Chloride: 103 mmol/L (ref 98–110)
Creat: 1 mg/dL — ABNORMAL HIGH (ref 0.50–0.99)
GFR, Est African American: 68 mL/min/{1.73_m2} (ref >=60)
GFR, Est Non African American: 59 mL/min/{1.73_m2} — ABNORMAL LOW (ref >=60)
Globulin: 2.3 g/dL (calc) (ref 1.9–3.7)
Glucose, Bld: 229 mg/dL — ABNORMAL HIGH (ref 65–99)
Potassium: 4.4 mmol/L (ref 3.5–5.3)
Sodium: 140 mmol/L (ref 135–146)
Total Bilirubin: 0.5 mg/dL (ref 0.2–1.2)
Total Protein: 6.5 g/dL (ref 6.1–8.1)

## 2021-05-06 LAB — CBC WITH DIFFERENTIAL/PLATELET
Absolute Monocytes: 459 cells/uL (ref 200–950)
Basophils Absolute: 82 cells/uL (ref 0–200)
Basophils Relative: 1.6 %
Eosinophils Absolute: 408 cells/uL (ref 15–500)
Eosinophils Relative: 8 %
HCT: 40.1 % (ref 35.0–45.0)
Hemoglobin: 13.3 g/dL (ref 11.7–15.5)
Lymphs Abs: 1081 cells/uL (ref 850–3900)
MCH: 30.2 pg (ref 27.0–33.0)
MCHC: 33.2 g/dL (ref 32.0–36.0)
MCV: 90.9 fL (ref 80.0–100.0)
MPV: 13.2 fL — ABNORMAL HIGH (ref 7.5–12.5)
Monocytes Relative: 9 %
Neutro Abs: 3070 cells/uL (ref 1500–7800)
Neutrophils Relative %: 60.2 %
Platelets: 166 10*3/uL (ref 140–400)
RBC: 4.41 10*6/uL (ref 3.80–5.10)
RDW: 12.2 % (ref 11.0–15.0)
Total Lymphocyte: 21.2 %
WBC: 5.1 10*3/uL (ref 3.8–10.8)

## 2021-05-06 LAB — URINALYSIS, ROUTINE W REFLEX MICROSCOPIC
Bilirubin Urine: NEGATIVE
Glucose, UA: NEGATIVE
Hgb urine dipstick: NEGATIVE
Hyaline Cast: NONE SEEN /LPF
Ketones, ur: NEGATIVE
Nitrite: NEGATIVE
Protein, ur: NEGATIVE
RBC / HPF: NONE SEEN /HPF (ref 0–2)
Specific Gravity, Urine: 1.011 (ref 1.001–1.035)
pH: <=5 (ref 5.0–8.0)

## 2021-05-06 LAB — MICROSCOPIC MESSAGE

## 2021-05-06 LAB — URIC ACID: Uric Acid, Serum: 5.2 mg/dL (ref 2.5–7.0)

## 2021-05-06 LAB — C3 AND C4
C3 Complement: 154 mg/dL (ref 83–193)
C4 Complement: 30 mg/dL (ref 15–57)

## 2021-05-06 LAB — ANTI-DNA ANTIBODY, DOUBLE-STRANDED: ds DNA Ab: <1 IU/mL

## 2021-05-06 LAB — SEDIMENTATION RATE: Sed Rate: 14 mm/h (ref 0–30)

## 2021-05-06 NOTE — Progress Notes (Signed)
CBC is normal, glucose is elevated.  GFR is low and stable.  Uric acid is in desirable range.  Sed rate is normal.  Complements are normal.  Double-stranded DNA is negative.  UA showed moderate bacteria, epithelial cells and white cells most likely not a clean sample.  If patient is symptomatic then she should see her PCP to rule out UTI.

## 2021-05-07 ENCOUNTER — Other Ambulatory Visit: Payer: Self-pay | Admitting: Rheumatology

## 2021-05-07 ENCOUNTER — Other Ambulatory Visit: Payer: Self-pay | Admitting: Cardiovascular Disease

## 2021-05-07 ENCOUNTER — Other Ambulatory Visit: Payer: Self-pay | Admitting: Physician Assistant

## 2021-05-07 DIAGNOSIS — M359 Systemic involvement of connective tissue, unspecified: Secondary | ICD-10-CM

## 2021-05-08 NOTE — Telephone Encounter (Signed)
Next Visit: 09/28/2021  Last Visit: 05/05/2021  Last Fill: 03/02/2021  DX: Autoimmune disease  Current Dose per office note 05/05/2021: leflunomide 10 mg p.o. daily  Labs: 05/05/2021 CBC is normal, glucose is elevated.  GFR is low and stable.    Okay to refill Arava?

## 2021-06-23 ENCOUNTER — Telehealth: Payer: Self-pay | Admitting: Cardiovascular Disease

## 2021-06-23 NOTE — Telephone Encounter (Signed)
Patient called and canceled carotid appt & did not want to reschedule

## 2021-06-23 NOTE — Telephone Encounter (Signed)
Called patient to reschedule carotid ultrasound,due to wrong indication on order. We can offer vascuscreen to this patient for $50, or have patient sign ABN at time of visit. Left message for patient to call back to inform

## 2021-06-30 ENCOUNTER — Other Ambulatory Visit: Payer: Self-pay

## 2021-06-30 ENCOUNTER — Ambulatory Visit: Payer: BC Managed Care – PPO | Admitting: Cardiovascular Disease

## 2021-06-30 ENCOUNTER — Encounter: Payer: Self-pay | Admitting: Cardiovascular Disease

## 2021-06-30 ENCOUNTER — Ambulatory Visit
Admission: RE | Admit: 2021-06-30 | Discharge: 2021-06-30 | Disposition: A | Discharge status: Home / Self Care | Payer: BC Managed Care – PPO | Source: Ambulatory Visit | Attending: Internal Medicine | Admitting: Internal Medicine

## 2021-06-30 VITALS — BP 120/66 | HR 68 | Ht 62.0 in | Wt 177.1 lb

## 2021-06-30 DIAGNOSIS — E785 Hyperlipidemia, unspecified: Secondary | ICD-10-CM

## 2021-06-30 DIAGNOSIS — E782 Mixed hyperlipidemia: Secondary | ICD-10-CM | POA: Diagnosis not present

## 2021-06-30 DIAGNOSIS — E119 Type 2 diabetes mellitus without complications: Secondary | ICD-10-CM | POA: Diagnosis not present

## 2021-06-30 DIAGNOSIS — I25118 Atherosclerotic heart disease of native coronary artery with other forms of angina pectoris: Secondary | ICD-10-CM

## 2021-06-30 DIAGNOSIS — I6523 Occlusion and stenosis of bilateral carotid arteries: Secondary | ICD-10-CM | POA: Diagnosis not present

## 2021-06-30 DIAGNOSIS — E118 Type 2 diabetes mellitus with unspecified complications: Secondary | ICD-10-CM

## 2021-06-30 DIAGNOSIS — I219 Acute myocardial infarction, unspecified: Secondary | ICD-10-CM

## 2021-06-30 DIAGNOSIS — I10 Essential (primary) hypertension: Secondary | ICD-10-CM | POA: Diagnosis not present

## 2021-06-30 DIAGNOSIS — R030 Elevated blood-pressure reading, without diagnosis of hypertension: Secondary | ICD-10-CM

## 2021-06-30 DIAGNOSIS — I358 Other nonrheumatic aortic valve disorders: Secondary | ICD-10-CM

## 2021-06-30 MED ORDER — NITROGLYCERIN 0.4 MG SL SUBL: 0.4000 mg | SUBLINGUAL_TABLET | SUBLINGUAL | 3 refills | Status: DC | PRN

## 2021-06-30 NOTE — Progress Notes (Signed)
Cardiology Office Note  Date:  06/30/2021   ID:  Susan Daniels 1955/12/17, MRN [id number]  PCP:  Susan Huddle, MD   Chief Complaint  Patient presents with   12 month follow up     "Doing well." Medications reviewed by the patient verbally.     HPI:  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 Willy Eddy physicians  A1c running high: in the past Weight down 20 pounds this past year Sugars at home: Am: 187, late pm 123-157 Still seems to be running a little bit high  No chest pain, no shortness of breath concerning for angina  EKG personally reviewed by myself on todays visit NSR rate 68 bpm   Anginal sx 05/24/2019 Leading to catheterization and stenting Denies having significant anginal symptoms on today's visit  Details of her prior cardiac history reviewed as below 05/24/2019:   [place] 05/24/2019: NSTEMI; DES to 100% mRCA; nonobstructive and branch disease in other vessels; normal EF  [place] 05/24/2019: EF 60-65%, indeterminate diastolic function, no significant valve disease  Lab work reviewed  HAB1C: in 05/2019 was 10.2 3 months ago: "was still high" "8 or 9"   PMH:   has a past medical history of Arthritis, Diabetes mellitus without complication ([place]), [id number]), and Heart murmur.  PSH:    Past Surgical History:  Procedure Laterality Date   COLONOSCOPY WITH PROPOFOL N/A 01/30/2013   Procedure: COLONOSCOPY WITH PROPOFOL;  Surgeon: Garlan Fair, MD;  Location: [facility];  Service: Endoscopy;  Laterality: N/A;   dental implant  09/29/2018   HEART STENT     TUBAL LIGATION      Current Outpatient  Medications  Medication Sig Dispense Refill   allopurinol (ZYLOPRIM) 100 MG tablet TAKE 1 TABLET BY MOUTH EVERY DAY 90 tablet 0   aspirin 81 MG chewable tablet Chew 81 mg by mouth daily.      B-D ULTRA-FINE 33 LANCETS MISC Use as directed to test blood glucose     Blood Glucose Monitoring Suppl (GLUCOCOM BLOOD GLUCOSE MONITOR) DEVI Use for glucose testing as directed     CALCIUM PO Take by mouth daily.     Cholecalciferol (VITAMIN D PO) Take by mouth daily.     leflunomide (ARAVA) 10 MG tablet TAKE 1 TABLET BY MOUTH EVERY DAY 90 tablet 0   lisinopril (PRINIVIL,ZESTRIL) 2.5 MG tablet lisinopril 2.5 mg tablet     metFORMIN (GLUCOPHAGE-XR) 500 MG 24 hr tablet Take 500 mg by mouth in the morning and at bedtime.      metoprolol tartrate (LOPRESSOR) 25 MG tablet TAKE 1/2 TABLET BY MOUTH TWICE A DAY 90 tablet 0   nitroGLYCERIN (NITROSTAT) 0.4 MG SL tablet Place under the tongue as needed.     OneTouch Delica Lancets 99991111 MISC CHECK BLOOD SUGAR TWICE A DAY DX E11.65 FINGERSTICK 90 DAYS     ONETOUCH VERIO test strip CHECK BLOOD SUGAR TWICE A DAY PRIOR TO MEAL     rosuvastatin (CRESTOR) 10 MG tablet Take 10 mg by mouth at bedtime.      Semaglutide,0.25 or 0.'5MG'$ /DOS, (OZEMPIC, 0.25 OR 0.5 MG/DOSE,) 2 MG/1.5ML SOPN INJECT 0.25 MG SUBCUTANEOUS ONCE A WEEK FOR 4 WEEKS THEN 0.5  MG WEEKLY     No current facility-administered medications for this visit.     Allergies:   Aspirin, Epinephrine, and Hydrocodone   Social History:  The patient  reports that she quit smoking about 41 years ago. Her smoking use included cigarettes. She has a 3.50 pack-year smoking history. She has never used smokeless tobacco. She reports current alcohol use. She reports that she does not use drugs.   Family History:   family history includes Healthy in her daughter, mother, and son; Heart disease in her father; Hypertension in her sister; Rheumatic fever in her father and sister.    Review of Systems: Review of Systems   Constitutional:  Positive for weight loss.  HENT: Negative.    Respiratory: Negative.    Cardiovascular: Negative.   Gastrointestinal: Negative.   Musculoskeletal: Negative.   Neurological: Negative.   Psychiatric/Behavioral: Negative.    All other systems reviewed and are negative.  PHYSICAL EXAM: VS:  BP 120/66 (BP Location: Left Arm, Patient Position: Sitting, Cuff Size: Normal)   Pulse 68   Ht '5\' 2"'$  (1.575 m)   Wt 177 lb 2 oz (80.3 kg)   SpO2 98%   BMI 32.40 kg/m  , BMI Body mass index is 32.4 kg/m. Constitutional:  oriented to person, place, and time. No distress.  HENT:  Head: Grossly normal Eyes:  no discharge. No scleral icterus.  Neck: No JVD, no carotid bruits  Cardiovascular: Regular rate and rhythm, no murmurs appreciated Pulmonary/Chest: Clear to auscultation bilaterally, no wheezes or rails Abdominal: Soft.  no distension.  no tenderness.  Musculoskeletal: Normal range of motion Neurological:  normal muscle tone. Coordination normal. No atrophy Skin: Skin warm and dry Psychiatric: normal affect, pleasant   Recent Labs: 05/05/2021: ALT 15; BUN 18; Creat 1.00; Hemoglobin 13.3; Platelets 166; Potassium 4.4; Sodium 140    Lipid Panel No results found for: CHOL, HDL, LDLCALC, TRIG    Wt Readings from Last 3 Encounters:  06/30/21 177 lb 2 oz (80.3 kg)  05/05/21 184 lb 3.2 oz (83.6 kg)  03/02/21 197 lb 9.6 oz (89.6 kg)     ASSESSMENT AND PLAN:  Problem List Items Addressed This Visit       Cardiology Problems   CAD (coronary artery disease), native coronary artery - Primary   Relevant Orders   EKG 12-Lead     Other   Diabetes mellitus type 2 with complications ([place])   Dyslipidemia   Relevant Orders   EKG 12-Lead   Coronary disease with stable angina Currently with no symptoms of angina. No further workup at this time. Continue current medication regimen. Carotid u/s ordered  Diabetes type 2 with complications Long discussion with her  concerning importance of better diabetes control Stressed importance of better hemoglobin A1c control We have encouraged continued exercise, careful diet management in an effort to lose weight. She will consider meeting with endocrinology Congratulated her on 20 pound weight loss.  With this sugar still running high  Hyperlipidemia We have requested labs from PMD  obesity Recent 20 pound weight loss Doing very well  Autoimmune disorder Followed by rheumatology Reports symptoms are stable    Total encounter time more than 25 minutes  Greater than 50% was spent in counseling and coordination of care with the patient   Signed, Esmond Plants, M.D., Ph.D. [place], [place]

## 2021-06-30 NOTE — Patient Instructions (Signed)
Medication Instructions:  No changes  If you need a refill on your cardiac medications before your next appointment, please call your pharmacy.    Lab work: No new labs needed   If you have labs (blood work) drawn today and your tests are completely normal, you will receive your results only by: MyChart Message (if you have MyChart) OR A paper copy in the mail If you have any lab test that is abnormal or we need to change your treatment, we will call you to review the results.   Testing/Procedures: No new testing needed   Follow-Up: At [facility], you and your health needs are our priority.  As part of our continuing mission to provide you with exceptional heart care, we have created designated Provider Care Teams.  These Care Teams include your primary Cardiologist (physician) and Advanced Practice Providers (APPs -  Physician Assistants and Nurse Practitioners) who all work together to provide you with the care you need, when you need it.  You will need a follow up appointment in 12 months  Providers on your designated Care Team:   Christopher Berge, NP Ryan Dunn, PA-C Jacquelyn Visser, PA-C Cadence Furth, PA-C  Any Other Special Instructions Will Be Listed Below (If Applicable).  COVID-19 Vaccine Information can be found at: [email or web address] For questions related to vaccine distribution or appointments, please email [email or web address] or call [phone].    

## 2021-07-01 ENCOUNTER — Telehealth: Payer: Self-pay | Admitting: *Deleted

## 2021-07-01 DIAGNOSIS — R0602 Shortness of breath: Secondary | ICD-10-CM

## 2021-07-01 DIAGNOSIS — I509 Heart failure, unspecified: Secondary | ICD-10-CM

## 2021-07-01 NOTE — Telephone Encounter (Signed)
Patient returning call.

## 2021-07-01 NOTE — Telephone Encounter (Signed)
Clarified with Dr. Rockey Situ. No CXR is needed.  Notified pt no CXR needed at this time.  Pt voiced understanding.  Orders cancelled.

## 2021-07-01 NOTE — Telephone Encounter (Signed)
Returned pt's call at her work number. No answer. Lmtcb.

## 2021-07-01 NOTE — Telephone Encounter (Signed)
Pt seen in office yesterday 06/30/21 with Dr. Rockey Situ.  CXR to be ordered at visit. Pt did not have CXR yesterday.  Attempted to call pt to discuss having CXR today.  Orders placed.  No answer at this time. Lmtcb.

## 2021-07-01 NOTE — Telephone Encounter (Signed)
Patient is returing your call and states it is ok to leave a message on her voicemail

## 2021-08-19 ENCOUNTER — Other Ambulatory Visit: Payer: Self-pay | Admitting: Cardiovascular Disease

## 2021-08-26 ENCOUNTER — Other Ambulatory Visit: Payer: Self-pay | Admitting: Physician Assistant

## 2021-08-26 NOTE — Telephone Encounter (Signed)
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  GFR is low and stable  Okay to refill Allopurinol?

## 2021-08-31 ENCOUNTER — Other Ambulatory Visit: Payer: Self-pay | Admitting: Physician Assistant

## 2021-08-31 DIAGNOSIS — M359 Systemic involvement of connective tissue, unspecified: Secondary | ICD-10-CM

## 2021-08-31 NOTE — Telephone Encounter (Signed)
Next Visit: 09/28/2021   Last Visit: 05/05/2021   Last Fill: 05/08/2021   DX: Autoimmune disease   Current Dose per office note 05/05/2021: leflunomide 10 mg p.o. daily   Labs: 05/05/2021 CBC is normal, glucose is elevated.  GFR is low and stable.    Left message to advise patient she is due to update labs.    Okay to refill Arava?

## 2021-09-14 NOTE — Progress Notes (Deleted)
Office Visit Note  Patient: Susan Daniels             Date of Birth: 04-22-1956           MRN: [id number]             PCP: Josetta Huddle, MD Referring: Josetta Huddle, MD Visit Date: 09/28/2021 Occupation: @GUAROCC @  Subjective:  No chief complaint on file.   History of Present Illness: Susan Daniels is a 65 y.o. female ***   Activities of Daily Living:  Patient reports morning stiffness for *** {minute/hour:19697}.   Patient {ACTIONS;DENIES/REPORTS:21021675::"Denies"} nocturnal pain.  Difficulty dressing/grooming: {ACTIONS;DENIES/REPORTS:21021675::"Denies"} Difficulty climbing stairs: {ACTIONS;DENIES/REPORTS:21021675::"Denies"} Difficulty getting out of chair: {ACTIONS;DENIES/REPORTS:21021675::"Denies"} Difficulty using hands for taps, buttons, cutlery, and/or writing: {ACTIONS;DENIES/REPORTS:21021675::"Denies"}  No Rheumatology ROS completed.   PMFS History:  Patient Active Problem List   Diagnosis Date Noted   CAD (coronary artery disease), native coronary artery 06/16/2020   Diabetes mellitus type 2 with complications ([place]) 12/45/8099   Autoimmune disease ([place]) 06/03/2017   High risk medication use 06/03/2017   Primary osteoarthritis of both hands 06/03/2017   Primary osteoarthritis of both knees 06/03/2017   Primary osteoarthritis of both feet 06/03/2017   Heart murmur 06/03/2017   History of hypothyroidism 06/03/2017   Dyslipidemia 06/03/2017   History of diabetes mellitus 06/03/2017   Vitamin D deficiency 06/03/2017    Past Medical History:  Diagnosis Date   Arthritis    Rheumatoid   Diabetes mellitus without complication ([facility])    IPJASNKN(397.6)    migraines   Heart murmur     Family History  Problem Relation Age of Onset   Healthy Mother    Heart disease Father    Rheumatic fever Father    Hypertension Sister    Rheumatic fever Sister    Healthy Son    Healthy Daughter    Past Surgical History:  Procedure Laterality Date   COLONOSCOPY  WITH PROPOFOL N/A 01/30/2013   Procedure: COLONOSCOPY WITH PROPOFOL;  Surgeon: Garlan Fair, MD;  Location: [facility];  Service: Endoscopy;  Laterality: N/A;   dental implant  09/29/2018   HEART STENT     TUBAL LIGATION     Social History   Social History Narrative   Not on file   Immunization History  Administered Date(s) Administered   PFIZER(Purple Top)SARS-COV-2 Vaccination 01/29/2020, 02/19/2020, 10/10/2020     Objective: Vital Signs: There were no vitals taken for this visit.   Physical Exam   Musculoskeletal Exam: ***  CDAI Exam: CDAI Score: -- Patient Global: --; Provider Global: -- Swollen: --; Tender: -- Joint Exam 09/28/2021   No joint exam has been documented for this visit   There is currently no information documented on the homunculus. Go to the Rheumatology activity and complete the homunculus joint exam.  Investigation: No additional findings.  Imaging: No results found.  Recent Labs: Lab Results  Component Value Date   WBC 5.1 05/05/2021   HGB 13.3 05/05/2021   PLT 166 05/05/2021   NA 140 05/05/2021   K 4.4 05/05/2021   CL 103 05/05/2021   CO2 28 05/05/2021   GLUCOSE 229 (H) 05/05/2021   BUN 18 05/05/2021   CREATININE 1.00 (H) 05/05/2021   BILITOT 0.5 05/05/2021   ALKPHOS 180 (H) 05/18/2019   AST 12 05/05/2021   ALT 15 05/05/2021   PROT 6.5 05/05/2021   ALBUMIN 3.8 05/18/2019   CALCIUM 9.2 05/05/2021   GFRAA 68 05/05/2021   QFTBGOLDPLUS NEGATIVE 03/02/2021  Speciality Comments: PLQ eye exam: 09/08/2020 W[facility] [place] Follow up in 6 months. Abnormal OCT.  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  No orders of the defined types were placed in this encounter.   Face-to-face time spent with patient was *** minutes. Greater than 50% of time was spent in counseling and coordination of care.  Follow-Up Instructions: No follow-ups on file.   Ofilia Neas, PA-C  Note - This record has been created using Dragon software.  Chart creation errors have been sought, but may not always  have been located. Such creation errors do not reflect on  the standard of medical care.

## 2021-09-28 ENCOUNTER — Ambulatory Visit: Payer: BC Managed Care – PPO | Admitting: Physician Assistant

## 2021-09-28 ENCOUNTER — Other Ambulatory Visit: Payer: Self-pay | Admitting: Physician Assistant

## 2021-09-28 DIAGNOSIS — Z8639 Personal history of other endocrine, nutritional and metabolic disease: Secondary | ICD-10-CM

## 2021-09-28 DIAGNOSIS — R011 Cardiac murmur, unspecified: Secondary | ICD-10-CM

## 2021-09-28 DIAGNOSIS — M359 Systemic involvement of connective tissue, unspecified: Secondary | ICD-10-CM

## 2021-09-28 DIAGNOSIS — M8589 Other specified disorders of bone density and structure, multiple sites: Secondary | ICD-10-CM

## 2021-09-28 DIAGNOSIS — Z79899 Other long term (current) drug therapy: Secondary | ICD-10-CM

## 2021-09-28 DIAGNOSIS — M19071 Primary osteoarthritis, right ankle and foot: Secondary | ICD-10-CM

## 2021-09-28 DIAGNOSIS — M19041 Primary osteoarthritis, right hand: Secondary | ICD-10-CM

## 2021-09-28 DIAGNOSIS — Z780 Asymptomatic menopausal state: Secondary | ICD-10-CM

## 2021-09-28 DIAGNOSIS — M1A0710 Idiopathic chronic gout, right ankle and foot, without tophus (tophi): Secondary | ICD-10-CM

## 2021-09-28 DIAGNOSIS — H35389 Toxic maculopathy, unspecified eye: Secondary | ICD-10-CM

## 2021-09-28 DIAGNOSIS — E785 Hyperlipidemia, unspecified: Secondary | ICD-10-CM

## 2021-09-28 DIAGNOSIS — M170 Bilateral primary osteoarthritis of knee: Secondary | ICD-10-CM

## 2021-09-28 DIAGNOSIS — R7989 Other specified abnormal findings of blood chemistry: Secondary | ICD-10-CM

## 2021-10-02 ENCOUNTER — Other Ambulatory Visit: Payer: Self-pay | Admitting: Physician Assistant

## 2021-10-02 DIAGNOSIS — M359 Systemic involvement of connective tissue, unspecified: Secondary | ICD-10-CM

## 2021-10-05 ENCOUNTER — Telehealth: Payer: Self-pay | Admitting: Rheumatology

## 2021-10-05 NOTE — Telephone Encounter (Signed)
Opened in error

## 2021-10-07 NOTE — Progress Notes (Signed)
Office Visit Note  Patient: Susan Daniels             Date of Birth: [birth year]           MRN: [id number]             PCP: Josetta Huddle, MD Referring: Josetta Huddle, MD Visit Date: 10/08/2021 Occupation: @GUAROCC @  Subjective:  Medication monitoring   History of Present Illness: Susan Daniels is a 65 y.o. female with history of autoimmune disease and osteoarthritis.  She is taking arava 10 mg 1 tablet by mouth daily.  She is tolerating arava without any side effects.  She denies any signs or symptoms of a systemic autoimmune disease flare recently.  She denies any joint pain or joint swelling at this time.  She has not had any recent rashes or hair loss.  She denies any sores in her mouth or nose.  She has not had any sicca symptoms.  She denies any swollen lymph nodes or recent fevers.  She has not had any symptoms of Raynaud's.  She denies any shortness of breath, pleuritic chest pain, or palpitations. She denies any recent gout flares.  She remains on allopurinol 100 mg 1 tablet daily.  Patient reports that last week she had an upper respiratory tract infection.  She states she still has some lingering congestion.  She has not held a rate of breath during the infection.  She initially took DayQuil and transition to acetaminophen cold and sinus over the weekend.     Activities of Daily Living:  Patient reports morning stiffness for 0 minutes.   Patient Denies nocturnal pain.  Difficulty dressing/grooming: Denies Difficulty climbing stairs: Denies Difficulty getting out of chair: Denies Difficulty using hands for taps, buttons, cutlery, and/or writing: Denies  Review of Systems  Constitutional:  Negative for fatigue.  HENT:  Negative for mouth sores, mouth dryness and nose dryness.   Eyes:  Positive for dryness. Negative for pain and itching.  Respiratory:  Negative for shortness of breath and difficulty breathing.   Cardiovascular:  Negative for chest pain and  palpitations.  Gastrointestinal:  Negative for blood in stool, constipation and diarrhea.  Endocrine: Negative for increased urination.  Genitourinary:  Negative for difficulty urinating.  Musculoskeletal:  Negative for joint pain, joint pain, joint swelling, myalgias, morning stiffness, muscle tenderness and myalgias.  Skin:  Negative for color change, rash and redness.  Allergic/Immunologic: Negative for susceptible to infections.  Neurological:  Negative for dizziness, numbness, headaches, memory loss and weakness.  Hematological:  Negative for bruising/bleeding tendency.  Psychiatric/Behavioral:  Negative for confusion.    PMFS History:  Patient Active Problem List   Diagnosis Date Noted   CAD (coronary artery disease), native coronary artery 06/16/2020   Diabetes mellitus type 2 with complications ([place]) 09/98/3382   Autoimmune disease ([place]) 06/03/2017   High risk medication use 06/03/2017   Primary osteoarthritis of both hands 06/03/2017   Primary osteoarthritis of both knees 06/03/2017   Primary osteoarthritis of both feet 06/03/2017   Heart murmur 06/03/2017   History of hypothyroidism 06/03/2017   Dyslipidemia 06/03/2017   History of diabetes mellitus 06/03/2017   Vitamin D deficiency 06/03/2017    Past Medical History:  Diagnosis Date   Arthritis    Rheumatoid   Diabetes mellitus without complication (HCC)    [id number](341.9)    migraines   Heart murmur     Family History  Problem Relation Age of Onset   Healthy Mother  Heart disease Father    Rheumatic fever Father    Hypertension Sister    Rheumatic fever Sister    Healthy Son    Healthy Daughter    Past Surgical History:  Procedure Laterality Date   COLONOSCOPY WITH PROPOFOL N/A 01/30/2013   Procedure: COLONOSCOPY WITH PROPOFOL;  Surgeon: Garlan Fair, MD;  Location: [facility];  Service: Endoscopy;  Laterality: N/A;   dental implant  09/29/2018   HEART STENT     TUBAL LIGATION     Social  History   Social History Narrative   Not on file   Immunization History  Administered Date(s) Administered   PFIZER(Purple Top)SARS-COV-2 Vaccination 01/29/2020, 02/19/2020, 10/10/2020     Objective: Vital Signs: BP (!) 148/83 (BP Location: Left Arm, Patient Position: Sitting, Cuff Size: Normal)   Pulse 69   Ht 5' 2"  (1.575 m)   Wt 181 lb 6.4 oz (82.3 kg)   BMI 33.18 kg/m    Physical Exam Vitals and nursing note reviewed.  Constitutional:      Appearance: She is well-developed.  HENT:     Head: Normocephalic and atraumatic.  Eyes:     Conjunctiva/sclera: Conjunctivae normal.  Cardiovascular:     Rate and Rhythm: Normal rate and regular rhythm.     Heart sounds: Murmur heard.  Pulmonary:     Effort: Pulmonary effort is normal.     Breath sounds: Normal breath sounds.  Abdominal:     General: Bowel sounds are normal.     Palpations: Abdomen is soft.  Musculoskeletal:     Cervical back: Normal range of motion.  Skin:    General: Skin is warm and dry.     Capillary Refill: Capillary refill takes less than 2 seconds.  Neurological:     Mental Status: She is alert and oriented to person, place, and time.  Psychiatric:        Behavior: Behavior normal.     Musculoskeletal Exam: C-spine, thoracic spine, lumbar spine have good range of motion with no discomfort.  Shoulder joints, elbow joints, wrist joints, MCPs, PIPs, DIPs have good range of motion with no synovitis.  She has PIP and DIP thickening consistent with osteoarthritis of both hands.  CMC joint thickening noted bilaterally.  Complete fist formation bilaterally.  Hip joints have good range of motion with no groin pain.  No tenderness over trochanteric bursa bilaterally.  Knee joints have good range of motion with some discomfort in the right knee.  No warmth or effusion of knee joints noted on exam.  Ankle joints have good range of motion with no tenderness or joint swelling.  CDAI Exam: CDAI Score: -- Patient  Global: --; Provider Global: -- Swollen: --; Tender: -- Joint Exam 10/08/2021   No joint exam has been documented for this visit   There is currently no information documented on the homunculus. Go to the Rheumatology activity and complete the homunculus joint exam.  Investigation: No additional findings.  Imaging: No results found.  Recent Labs: Lab Results  Component Value Date   WBC 5.1 05/05/2021   HGB 13.3 05/05/2021   PLT 166 05/05/2021   NA 140 05/05/2021   K 4.4 05/05/2021   CL 103 05/05/2021   CO2 28 05/05/2021   GLUCOSE 229 (H) 05/05/2021   BUN 18 05/05/2021   CREATININE 1.00 (H) 05/05/2021   BILITOT 0.5 05/05/2021   ALKPHOS 180 (H) 05/18/2019   AST 12 05/05/2021   ALT 15 05/05/2021   PROT 6.5 05/05/2021  ALBUMIN 3.8 05/18/2019   CALCIUM 9.2 05/05/2021   GFRAA 68 05/05/2021   QFTBGOLDPLUS NEGATIVE 03/02/2021    Speciality Comments: PLQ eye exam: 09/08/2020 WNL [place] Follow up in 6 months. Abnormal OCT.  Started Plaquenil at age 23  Procedures:  No procedures performed Allergies: Aspirin, Epinephrine, and Hydrocodone      Assessment / Plan:     Visit Diagnoses: Autoimmune disease ([place]) - History of inflammatory arthritis, positive ANA(ENA, C3-C4, anticardiolipin, beta-2, lupus anticoagulant was negative. ANA was 1: 320 speckled: She has not had any signs or symptoms of a systemic autoimmune disease flare recently.  She is clinically doing well taking Arava 10 mg 1 tablet by mouth every day.  She is tolerating Arava without any side effects and has not missed any doses recently.  She did not notice any new or worsening symptoms when switching from Plaquenil to [place] after developing toxic maculopathy while taking Plaquenil.  She is not experiencing any increased joint pain, stiffness, or inflammation.  No synovitis was noted on examination today.  She has not had any recent rashes, oral or nasal ulcerations, hair loss, Raynaud's, sicca symptoms,  cervical lymphadenopathy, shortness of breath, pleuritic chest pain, or palpitations.  Her lungs were clear to auscultation on examination today. Lab work from 05/05/21 was reviewed today in the office: dsDNA<1, ESR 14, complements WNL, no proteinuria, WBC count WNL,  creatinine 1.00 and GFR 59.  She is due to update the following lab work today.  Orders were released. She will remain on [place] as monotherapy.  A refill of [place] was sent to the pharmacy today.  She was advised to notify us if she develops signs or symptoms of a flare.  She will follow-up in the office in 3 months. - Plan: CBC with Differential/Platelet, COMPLETE METABOLIC PANEL WITH GFR, Urinalysis, Routine w reflex microscopic, Anti-DNA antibody, double-stranded, ANA, C3 and C4, Sedimentation rate, CBC with Differential/Platelet, COMPLETE METABOLIC PANEL WITH GFR, leflunomide (ARAVA) 10 MG tablet  High risk medication use - Arava 10 mg 1 tablet by mouth daily.  Discontinued Plaquenil due to toxic maculopathy.  CBC and CMP updated on 05/05/21.  She is overdue to updated lab work so orders for CBC and CMP were released.  Her next lab work will be due in March and every 3 months.  Standing orders for CBC and CMP were placed today. Discussed the importance of holding arava if she develops signs or symptoms of an infection and to resume once the infection has completely cleared.    - Plan: CBC with Differential/Platelet, COMPLETE METABOLIC PANEL WITH GFR, CBC with Differential/Platelet, COMPLETE METABOLIC PANEL WITH GFR She had an upper respiratory tract infection last week.  She treated her symptoms with over-the-counter products including DayQuil followed by acetaminophen cold and sinus over the weekend.  She did not hold Arava during that time.  Discussed that anytime she develops signs or symptoms of an infection she is to hold [place] until the infection has completely cleared.  She voiced understanding.  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 Coralyn Pear.  She was switched from Plaquenil to [place].  Primary osteoarthritis of both hands: She has PIP and DIP thickening consistent with osteoarthritis of both hands.  No tenderness or inflammation was noted on examination.  Complete fist formation bilaterally.  Discussed the importance of joint protection and muscle strengthening.  Primary osteoarthritis of both knees - [place] February 2020.  She has good range of motion of both knee joints with some discomfort in the right knee.  No warmth or effusion noted on examination today.  Primary osteoarthritis of both feet: She is not experiencing any discomfort in her feet at this time. She is wearing proper fitting shoes.   Osteopenia of multiple sites - 04/02/20 DEXA scan showed a T score of -1.4, BMD 0-proper fitting shoes were discussed.  .698 in the right femoral neck.  She is taking a calcium and vitamin D supplement daily.  No recent falls or fractures.   History of vitamin D deficiency: Vitamin D was 39 on 01/10/18.   Chronic idiopathic gout involving toe of right foot without tophus - She has not had any signs or symptoms of a gout flare.  She is taking allopurinol 100 mg 1 tablet daily and colchicine 0.6 mg 1 tablet daily as needed. Uric acid was within the desirable range-5.2 on 05/05/21.  Uric acid will be rechecked today.  - Plan: Uric acid  Other medical conditions are listed as follows:   Dyslipidemia  Heart murmur: Noted   History of diabetes mellitus  History of hypothyroidism  Postmenopausal   Orders: Orders Placed This Encounter  Procedures   CBC with Differential/Platelet   COMPLETE METABOLIC PANEL WITH GFR   Urinalysis, Routine w reflex microscopic   Anti-DNA antibody, double-stranded   ANA   C3 and C4   Sedimentation rate   Uric acid   CBC with Differential/Platelet   COMPLETE METABOLIC PANEL WITH GFR   Meds  ordered this encounter  Medications   leflunomide (ARAVA) 10 MG tablet    Sig: Take 1 tablet (10 mg total) by mouth daily.    Dispense:  90 tablet    Refill:  0     Follow-Up Instructions: Return in about 3 months (around 01/06/2022) for Autoimmune Disease, Osteoarthritis.   Ofilia Neas, PA-C  Note - This record has been created using Dragon software.  Chart creation errors have been sought, but may not always  have been located. Such creation errors do not reflect on  the standard of medical care.

## 2021-10-08 ENCOUNTER — Encounter: Payer: Self-pay | Admitting: Physician Assistant

## 2021-10-08 ENCOUNTER — Other Ambulatory Visit: Payer: Self-pay

## 2021-10-08 ENCOUNTER — Ambulatory Visit: Payer: BC Managed Care – PPO | Admitting: Physician Assistant

## 2021-10-08 VITALS — BP 148/83 | HR 69 | Ht 62.0 in | Wt 181.4 lb

## 2021-10-08 DIAGNOSIS — M19041 Primary osteoarthritis, right hand: Secondary | ICD-10-CM | POA: Diagnosis not present

## 2021-10-08 DIAGNOSIS — M1A0710 Idiopathic chronic gout, right ankle and foot, without tophus (tophi): Secondary | ICD-10-CM

## 2021-10-08 DIAGNOSIS — H35389 Toxic maculopathy, unspecified eye: Secondary | ICD-10-CM

## 2021-10-08 DIAGNOSIS — M170 Bilateral primary osteoarthritis of knee: Secondary | ICD-10-CM

## 2021-10-08 DIAGNOSIS — Z79899 Other long term (current) drug therapy: Secondary | ICD-10-CM | POA: Diagnosis not present

## 2021-10-08 DIAGNOSIS — Z8639 Personal history of other endocrine, nutritional and metabolic disease: Secondary | ICD-10-CM

## 2021-10-08 DIAGNOSIS — M359 Systemic involvement of connective tissue, unspecified: Secondary | ICD-10-CM

## 2021-10-08 DIAGNOSIS — T372X5A Adverse effect of antimalarials and drugs acting on other blood protozoa, initial encounter: Secondary | ICD-10-CM

## 2021-10-08 DIAGNOSIS — M8589 Other specified disorders of bone density and structure, multiple sites: Secondary | ICD-10-CM

## 2021-10-08 DIAGNOSIS — E785 Hyperlipidemia, unspecified: Secondary | ICD-10-CM

## 2021-10-08 DIAGNOSIS — Z780 Asymptomatic menopausal state: Secondary | ICD-10-CM

## 2021-10-08 DIAGNOSIS — R011 Cardiac murmur, unspecified: Secondary | ICD-10-CM

## 2021-10-08 DIAGNOSIS — M19072 Primary osteoarthritis, left ankle and foot: Secondary | ICD-10-CM

## 2021-10-08 DIAGNOSIS — M19042 Primary osteoarthritis, left hand: Secondary | ICD-10-CM

## 2021-10-08 DIAGNOSIS — M19071 Primary osteoarthritis, right ankle and foot: Secondary | ICD-10-CM

## 2021-10-08 MED ORDER — LEFLUNOMIDE 10 MG PO TABS: 10.0000 mg | ORAL_TABLET | Freq: Every day | ORAL | 0 refills | Status: DC

## 2021-10-08 NOTE — Patient Instructions (Signed)
Standing Labs °We placed an order today for your standing lab work.  ° °Please have your standing labs drawn in March and every 3 months  ° ° °If possible, please have your labs drawn 2 weeks prior to your appointment so that the provider can discuss your results at your appointment. ° °Please note that you may see your imaging and lab results in MyChart before we have reviewed them. °We may be awaiting multiple results to interpret others before contacting you. °Please allow our office up to 72 hours to thoroughly review all of the results before contacting the office for clarification of your results. ° °We have open lab daily: °Monday through Thursday from 1:30-4:30 PM and Friday from 1:30-4:00 PM °at the office of Dr. Shaili Deveshwar, [facility].   °Please be advised, all patients with office appointments requiring lab work will take precedent over walk-in lab work.  °If possible, please come for your lab work on Monday and Friday afternoons, as you may experience shorter wait times. °The office is located at [address], [address], [place], [place] [address] °No appointment is necessary.   °Labs are drawn by [facility]. Please bring your co-pay at the time of your lab draw.  You may receive a bill from [facility] for your lab work. ° °If you wish to have your labs drawn at another location, please call the office 24 hours in advance to send orders. ° °If you have any questions regarding directions or hours of operation,  °please call [phone].   °As a reminder, please drink plenty of water prior to coming for your lab work. Thanks! ° °

## 2021-10-09 NOTE — Progress Notes (Signed)
CBC WNL.  Glucose is elevated-281.  Rest of CMP WNL.  UA 1+ glucose.  Please notify the patient and advise her to monitor her blood sugar closely.   ESR WNL.  Complements WNL.  Uric acid WNL.

## 2021-10-09 NOTE — Progress Notes (Signed)
dsDNA is negative

## 2021-10-12 ENCOUNTER — Ambulatory Visit: Payer: BC Managed Care – PPO | Admitting: Physician Assistant

## 2021-10-12 LAB — ANA: Anti Nuclear Antibody (ANA): POSITIVE — AB

## 2021-10-12 LAB — COMPLETE METABOLIC PANEL WITH GFR
AG Ratio: 1.8 (calc) (ref 1.0–2.5)
ALT: 17 U/L (ref 6–29)
AST: 13 U/L (ref 10–35)
Albumin: 4.5 g/dL (ref 3.6–5.1)
Alkaline phosphatase (APISO): 66 U/L (ref 37–153)
BUN: 17 mg/dL (ref 7–25)
CO2: 29 mmol/L (ref 20–32)
Calcium: 9.4 mg/dL (ref 8.6–10.4)
Chloride: 100 mmol/L (ref 98–110)
Creat: 0.9 mg/dL (ref 0.50–1.05)
Globulin: 2.5 g/dL (calc) (ref 1.9–3.7)
Glucose, Bld: 281 mg/dL — ABNORMAL HIGH (ref 65–99)
Potassium: 4.7 mmol/L (ref 3.5–5.3)
Sodium: 139 mmol/L (ref 135–146)
Total Bilirubin: 0.6 mg/dL (ref 0.2–1.2)
Total Protein: 7 g/dL (ref 6.1–8.1)
eGFR: 71 mL/min/{1.73_m2} (ref >=60)

## 2021-10-12 LAB — CBC WITH DIFFERENTIAL/PLATELET
Absolute Monocytes: 434 cells/uL (ref 200–950)
Basophils Absolute: 51 cells/uL (ref 0–200)
Basophils Relative: 1 %
Eosinophils Absolute: 342 cells/uL (ref 15–500)
Eosinophils Relative: 6.7 %
HCT: 40.4 % (ref 35.0–45.0)
Hemoglobin: 13.5 g/dL (ref 11.7–15.5)
Lymphs Abs: 1474 cells/uL (ref 850–3900)
MCH: 31 pg (ref 27.0–33.0)
MCHC: 33.4 g/dL (ref 32.0–36.0)
MCV: 92.7 fL (ref 80.0–100.0)
MPV: 12.4 fL (ref 7.5–12.5)
Monocytes Relative: 8.5 %
Neutro Abs: 2800 cells/uL (ref 1500–7800)
Neutrophils Relative %: 54.9 %
Platelets: 181 10*3/uL (ref 140–400)
RBC: 4.36 10*6/uL (ref 3.80–5.10)
RDW: 11.8 % (ref 11.0–15.0)
Total Lymphocyte: 28.9 %
WBC: 5.1 10*3/uL (ref 3.8–10.8)

## 2021-10-12 LAB — ANTI-NUCLEAR AB-TITER (ANA TITER): ANA Titer 1: 1:320 {titer} — ABNORMAL HIGH

## 2021-10-12 LAB — URINALYSIS, ROUTINE W REFLEX MICROSCOPIC
Bilirubin Urine: NEGATIVE
Hgb urine dipstick: NEGATIVE
Ketones, ur: NEGATIVE
Leukocytes,Ua: NEGATIVE
Nitrite: NEGATIVE
Protein, ur: NEGATIVE
Specific Gravity, Urine: 1.015 (ref 1.001–1.035)
pH: <=5 (ref 5.0–8.0)

## 2021-10-12 LAB — C3 AND C4
C3 Complement: 153 mg/dL (ref 83–193)
C4 Complement: 28 mg/dL (ref 15–57)

## 2021-10-12 LAB — ANTI-DNA ANTIBODY, DOUBLE-STRANDED: ds DNA Ab: <1 IU/mL

## 2021-10-12 LAB — SEDIMENTATION RATE: Sed Rate: 14 mm/h (ref 0–30)

## 2021-10-12 LAB — URIC ACID: Uric Acid, Serum: 3.2 mg/dL (ref 2.5–7.0)

## 2021-10-12 NOTE — Progress Notes (Signed)
ANA remains positive-unchanged titer.

## 2021-11-25 ENCOUNTER — Other Ambulatory Visit: Payer: Self-pay | Admitting: Physician Assistant

## 2021-11-25 NOTE — Telephone Encounter (Signed)
Next Visit: 01/07/2022  Last Visit: 10/08/2021  Last Fill: 08/26/2021  DX: Chronic idiopathic gout involving toe of right foot without tophus   Current Dose per office note 10/08/2021: allopurinol 100 mg 1 tablet daily   Labs: 10/08/2021 CBC WNL.  Glucose is elevated-281.  Rest of CMP WNL.  UA 1+ glucose.   Uric acid WNL.   Okay to refill Allopurinol?

## 2021-12-30 NOTE — Progress Notes (Signed)
Office Visit Note  Patient: Susan Daniels             Date of Birth: [birth year]           MRN: [id number]             PCP: Josetta Huddle, MD Referring: Josetta Huddle, MD Visit Date: 01/13/2022 Occupation: @GUAROCC @  Subjective:  Medication monitoring   History of Present Illness: Susan Daniels is a 66 y.o. female with history of autoimmune disease and osteoarthritis.  She is taking Arava 10 mg 1 tablet by mouth daily.  She is tolerating her arava without any side effects and has not missed any doses recently.  She denies any signs or symptoms of an autoimmune disease flare.  She is not experiencing any joint pain or joint swelling at this time.  She has not had any morning stiffness.  She had right knee joint Visco gel injections performed in January/February 2020 which provided significant pain relief.  Her energy level has been stable.  She has not had any recent rashes, hair loss, Raynaud's phenomenon, oral or nasal ulcerations, sicca symptoms, or cervical lymphadenopathy.  She denies any shortness of breath, pleuritic chest pain, or palpitations.  She has not had any recent infections.  She denies any recent gout flares.  She remains on allopurinol 100 mg daily.    Activities of Daily Living:  Patient reports morning stiffness for 0 minutes.   Patient Denies nocturnal pain.  Difficulty dressing/grooming: Denies Difficulty climbing stairs: Denies Difficulty getting out of chair: Denies Difficulty using hands for taps, buttons, cutlery, and/or writing: Denies  Review of Systems  Constitutional:  Negative for fatigue.  HENT:  Negative for mouth sores, mouth dryness and nose dryness.   Eyes:  Negative for pain, itching and dryness.  Respiratory:  Negative for shortness of breath and difficulty breathing.   Cardiovascular:  Negative for chest pain and palpitations.  Gastrointestinal:  Negative for blood in stool, constipation and diarrhea.  Endocrine: Negative for increased  urination.  Genitourinary:  Negative for difficulty urinating.  Musculoskeletal:  Negative for joint pain, joint pain, joint swelling, myalgias, morning stiffness, muscle tenderness and myalgias.  Skin:  Negative for color change, rash and redness.  Allergic/Immunologic: Negative for susceptible to infections.  Neurological:  Positive for numbness and parasthesias. Negative for dizziness, headaches, memory loss and weakness.  Hematological:  Negative for bruising/bleeding tendency.  Psychiatric/Behavioral:  Negative for confusion.    PMFS History:  Patient Active Problem List   Diagnosis Date Noted   CAD (coronary artery disease), native coronary artery 06/16/2020   Diabetes mellitus type 2 with complications ([place]) 38/46/6599   Autoimmune disease ([place]) 06/03/2017   High risk medication use 06/03/2017   Primary osteoarthritis of both hands 06/03/2017   Primary osteoarthritis of both knees 06/03/2017   Primary osteoarthritis of both feet 06/03/2017   Heart murmur 06/03/2017   History of hypothyroidism 06/03/2017   Dyslipidemia 06/03/2017   History of diabetes mellitus 06/03/2017   Vitamin D deficiency 06/03/2017    Past Medical History:  Diagnosis Date   Arthritis    Rheumatoid   Diabetes mellitus without complication (HCC)    JTTSVXBL(390.3)    migraines   Heart murmur     Family History  Problem Relation Age of Onset   Healthy Mother    Heart disease Father    Rheumatic fever Father    Hypertension Sister    Rheumatic fever Sister    Healthy Son  Healthy Daughter    Past Surgical History:  Procedure Laterality Date   COLONOSCOPY WITH PROPOFOL N/A 01/30/2013   Procedure: COLONOSCOPY WITH PROPOFOL;  Surgeon: Garlan Fair, MD;  Location: [facility];  Service: Endoscopy;  Laterality: N/A;   dental implant  09/29/2018   HEART STENT     TUBAL LIGATION     Social History   Social History Narrative   Not on file   Immunization History  Administered Date(s)  Administered   PFIZER(Purple Top)SARS-COV-2 Vaccination 01/29/2020, 02/19/2020, 10/10/2020     Objective: Vital Signs: BP 125/82 (BP Location: Right Arm, Patient Position: Sitting, Cuff Size: Normal)    Pulse 82    Ht 5' 2"  (1.575 m)    Wt 175 lb 3.2 oz (79.5 kg)    BMI 32.04 kg/m    Physical Exam Vitals and nursing note reviewed.  Constitutional:      Appearance: She is well-developed.  HENT:     Head: Normocephalic and atraumatic.  Eyes:     Conjunctiva/sclera: Conjunctivae normal.  Cardiovascular:     Rate and Rhythm: Normal rate and regular rhythm.     Heart sounds: Murmur heard.  Pulmonary:     Effort: Pulmonary effort is normal.     Breath sounds: Normal breath sounds.  Abdominal:     General: Bowel sounds are normal.     Palpations: Abdomen is soft.  Musculoskeletal:     Cervical back: Normal range of motion.  Lymphadenopathy:     Cervical: No cervical adenopathy.  Skin:    General: Skin is warm and dry.     Capillary Refill: Capillary refill takes less than 2 seconds.  Neurological:     Mental Status: She is alert and oriented to person, place, and time.  Psychiatric:        Behavior: Behavior normal.     Musculoskeletal Exam: C-spine, thoracic spine, lumbar spine have good range of motion.  No midline spinal tenderness or SI joint tenderness.  Shoulder joints, elbow joints, wrist joints, MCPs, PIPs, DIPs have good range of motion with no synovitis.  Some PIP and DIP prominence consistent with osteoarthritis of both hands noted.  Hip joints have good range of motion with no groin pain.  Knee joints have good range of motion with no warmth or effusion.  Ankle joints have good range of motion with no tenderness or joint swelling.  CDAI Exam: CDAI Score: -- Patient Global: --; Provider Global: -- Swollen: --; Tender: -- Joint Exam 01/13/2022   No joint exam has been documented for this visit   There is currently no information documented on the homunculus. Go to  the Rheumatology activity and complete the homunculus joint exam.  Investigation: No additional findings.  Imaging: No results found.  Recent Labs: Lab Results  Component Value Date   WBC 5.1 10/08/2021   HGB 13.5 10/08/2021   PLT 181 10/08/2021   NA 139 10/08/2021   K 4.7 10/08/2021   CL 100 10/08/2021   CO2 29 10/08/2021   GLUCOSE 281 (H) 10/08/2021   BUN 17 10/08/2021   CREATININE 0.90 10/08/2021   BILITOT 0.6 10/08/2021   ALKPHOS 180 (H) 05/18/2019   AST 13 10/08/2021   ALT 17 10/08/2021   PROT 7.0 10/08/2021   ALBUMIN 3.8 05/18/2019   CALCIUM 9.4 10/08/2021   GFRAA 68 05/05/2021   QFTBGOLDPLUS NEGATIVE 03/02/2021    Speciality Comments: PLQ eye exam: 09/08/2020 WNL [place] Follow up in 6 months. Abnormal OCT.  Started Plaquenil at age 87  Procedures:  No procedures performed Allergies: Aspirin, Epinephrine, and Hydrocodone     Assessment / Plan:     Visit Diagnoses: Autoimmune disease ([place]) - History of inflammatory arthritis, positive ANA (ENA, C3-C4, anticardiolipin, beta-2, lupus anticoagulant was negative. ANA was 1: 320 speckled: She has not had any signs or symptoms of a flare since her last office visit.  She is clinically doing well taking Arava 10 mg 1 tablet by mouth daily.  She continues to tolerate arava without any side effects and has not missed any doses recently.  She is not experiencing any joint pain and has no synovitis on examination.  No morning stiffness or nocturnal discomfort.  She has not had any recent rashes, hair loss, Raynaud's phenomenon, oral or nasal ulcerations, cervical lymphadenopathy, sicca symptoms, shortness of breath, or pleuritic chest pain.  No signs of a malar rash or alopecia were noted.  No digital ulcerations or signs of gangrene were noted. Lab work from 10/08/21 was reviewed today in the office: ANA 1:320NH, dsDNA negative, complements WNL, and ESR WNL.  Labs were not consistent with a flare at that time.  The  following lab work will be updated today. She will remain on the current dose of Arava as prescribed.  She was advised to notify us if she develops any new or worsening symptoms.  She will follow-up in the office in 5 months.  - Plan: CBC with Differential/Platelet, COMPLETE METABOLIC PANEL WITH GFR, Urinalysis, Routine w reflex microscopic, Anti-DNA antibody, double-stranded, C3 and C4, Sedimentation rate  High risk medication use - Arava 10 mg 1 tablet by mouth daily.  Discontinued Plaquenil due to toxic maculopathy.   CBC and CMP drawn on 10/08/2021.  She is due to update lab work.  Orders for CBC and CMP were released.  Her next lab work will be due in June and every 3 months to monitor for drug toxicity. - Plan: CBC with Differential/Platelet, COMPLETE METABOLIC PANEL WITH GFR No recent infections.  Discussed the importance of holding arava if she develops signs or symptoms of an infection and to resume once the infection has cleared.   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 Coralyn Pear.   Primary osteoarthritis of both hands: She has PIP and DIP prominence consistent with osteoarthritis of both hands.  No tenderness or inflammation was noted.  Discussed the importance of joint protection and muscle strengthening.   Primary osteoarthritis of both knees -Right knee- Visco series January/February 2020.  She has good range of motion in both knee joints on examination today.  No warmth or effusion was noted at this time.  She noted significant improvement in her right knee joint pain after undergoing the visco series.  She does not want to reapply for Visco gel injections at this time.  Discussed the importance of lower extremity muscle strengthening.  She was advised to notify us if she develops increased joint pain or joint swelling.  Primary osteoarthritis of both feet: She is not experiencing any discomfort in  her feet at this time.  She has good range of motion of both ankle joints with no tenderness or joint swelling.  Osteopenia of multiple sites - 04/02/20 DEXA scan showed a T score of -1.4, BMD 0.698 in the right femoral neck.  DEXA will be due in May 2023.  She remains on vitamin D and calcium supplements daily.  She has not  had any recent falls or fractures. Future order for DXA placed today.   Chronic idiopathic gout involving toe of right foot without tophus -She has not had any signs or symptoms of a gout flare.  She is clinically doing well taking allopurinol 100 mg 1 tablet daily. She has not needed to take colchicine recently.  Uric acid: 10/08/2021 3.2-within the desirable range.  No medication changes will be made at this time.  She is advised to notify us if she develops signs or symptoms of a gout flare.  History of vitamin D deficiency: She is taking a daily vitamin D supplement.  Other medical conditions are listed as follows:  Heart murmur  Dyslipidemia  History of hypothyroidism  History of diabetes mellitus  Postmenopausal  Orders: Orders Placed This Encounter  Procedures   DG BONE DENSITY (DXA)   CBC with Differential/Platelet   COMPLETE METABOLIC PANEL WITH GFR   Urinalysis, Routine w reflex microscopic   Anti-DNA antibody, double-stranded   C3 and C4   Sedimentation rate   No orders of the defined types were placed in this encounter.    Follow-Up Instructions: Return in about 5 months (around 06/15/2022) for Autoimmune Disease.   Ofilia Neas, PA-C  Note - This record has been created using Dragon software.  Chart creation errors have been sought, but may not always  have been located. Such creation errors do not reflect on  the standard of medical care.

## 2022-01-03 ENCOUNTER — Other Ambulatory Visit: Payer: Self-pay | Admitting: Physician Assistant

## 2022-01-03 DIAGNOSIS — M359 Systemic involvement of connective tissue, unspecified: Secondary | ICD-10-CM

## 2022-01-04 NOTE — Telephone Encounter (Signed)
Next Visit: 01/13/2022   Last Visit: 10/08/2021   Last Fill: 10/08/2021  Dx: Autoimmune disease   Current Dose per office note 10/08/2021: Arava 10 mg 1 tablet by mouth daily  Labs: 10/08/2021 CBC WNL.  Glucose is elevated-281.  Rest of CMP WNL.  UA 1+ glucose.   Uric acid WNL.    Okay to refill Arava?

## 2022-01-07 ENCOUNTER — Ambulatory Visit: Payer: BC Managed Care – PPO | Admitting: Rheumatology

## 2022-01-13 ENCOUNTER — Ambulatory Visit: Payer: BC Managed Care – PPO | Admitting: Physician Assistant

## 2022-01-13 ENCOUNTER — Encounter: Payer: Self-pay | Admitting: Physician Assistant

## 2022-01-13 ENCOUNTER — Other Ambulatory Visit: Payer: Self-pay

## 2022-01-13 VITALS — BP 125/82 | HR 82 | Ht 62.0 in | Wt 175.2 lb

## 2022-01-13 DIAGNOSIS — E785 Hyperlipidemia, unspecified: Secondary | ICD-10-CM

## 2022-01-13 DIAGNOSIS — M19042 Primary osteoarthritis, left hand: Secondary | ICD-10-CM

## 2022-01-13 DIAGNOSIS — Z8639 Personal history of other endocrine, nutritional and metabolic disease: Secondary | ICD-10-CM

## 2022-01-13 DIAGNOSIS — M170 Bilateral primary osteoarthritis of knee: Secondary | ICD-10-CM

## 2022-01-13 DIAGNOSIS — R011 Cardiac murmur, unspecified: Secondary | ICD-10-CM

## 2022-01-13 DIAGNOSIS — Z79899 Other long term (current) drug therapy: Secondary | ICD-10-CM

## 2022-01-13 DIAGNOSIS — H35389 Toxic maculopathy, unspecified eye: Secondary | ICD-10-CM | POA: Diagnosis not present

## 2022-01-13 DIAGNOSIS — M19041 Primary osteoarthritis, right hand: Secondary | ICD-10-CM

## 2022-01-13 DIAGNOSIS — M19071 Primary osteoarthritis, right ankle and foot: Secondary | ICD-10-CM

## 2022-01-13 DIAGNOSIS — Z780 Asymptomatic menopausal state: Secondary | ICD-10-CM

## 2022-01-13 DIAGNOSIS — M359 Systemic involvement of connective tissue, unspecified: Secondary | ICD-10-CM | POA: Diagnosis not present

## 2022-01-13 DIAGNOSIS — T372X5A Adverse effect of antimalarials and drugs acting on other blood protozoa, initial encounter: Secondary | ICD-10-CM

## 2022-01-13 DIAGNOSIS — M1A0710 Idiopathic chronic gout, right ankle and foot, without tophus (tophi): Secondary | ICD-10-CM

## 2022-01-13 DIAGNOSIS — M8589 Other specified disorders of bone density and structure, multiple sites: Secondary | ICD-10-CM

## 2022-01-13 DIAGNOSIS — M19072 Primary osteoarthritis, left ankle and foot: Secondary | ICD-10-CM

## 2022-01-13 NOTE — Patient Instructions (Signed)
Standing Labs ?We placed an order today for your standing lab work.  ? ?Please have your standing labs drawn in June and every 3 months  ? ?If possible, please have your labs drawn 2 weeks prior to your appointment so that the provider can discuss your results at your appointment. ? ?Please note that you may see your imaging and lab results in MyChart before we have reviewed them. ?We may be awaiting multiple results to interpret others before contacting you. ?Please allow our office up to 72 hours to thoroughly review all of the results before contacting the office for clarification of your results. ? ?We have open lab daily: ?Monday through Thursday from 1:30-4:30 PM and Friday from 1:30-4:00 PM ?at the office of Dr. Shaili Deveshwar, [facility].   ?Please be advised, all patients with office appointments requiring lab work will take precedent over walk-in lab work.  ?If possible, please come for your lab work on Monday and Friday afternoons, as you may experience shorter wait times. ?The office is located at [address], [address], [place],  [address] ?No appointment is necessary.   ?Labs are drawn by [facility]. Please bring your co-pay at the time of your lab draw.  You may receive a bill from [facility] for your lab work. ? ?Please note if you are on Hydroxychloroquine and and an order has been placed for a Hydroxychloroquine level, you will need to have it drawn 4 hours or more after your last dose. ? ?If you wish to have your labs drawn at another location, please call the office 24 hours in advance to send orders. ? ?If you have any questions regarding directions or hours of operation,  ?please call [phone].   ?As a reminder, please drink plenty of water prior to coming for your lab work. Thanks! ? ?

## 2022-01-14 LAB — URINALYSIS, ROUTINE W REFLEX MICROSCOPIC
Bilirubin Urine: NEGATIVE
Hgb urine dipstick: NEGATIVE
Ketones, ur: NEGATIVE
Leukocytes,Ua: NEGATIVE
Nitrite: NEGATIVE
Protein, ur: NEGATIVE
Specific Gravity, Urine: 1.025 (ref 1.001–1.035)
pH: <=5 (ref 5.0–8.0)

## 2022-01-14 LAB — C3 AND C4
C3 Complement: 150 mg/dL (ref 83–193)
C4 Complement: 29 mg/dL (ref 15–57)

## 2022-01-14 LAB — CBC WITH DIFFERENTIAL/PLATELET
Absolute Monocytes: 570 cells/uL (ref 200–950)
Basophils Absolute: 70 cells/uL (ref 0–200)
Basophils Relative: 1.1 %
Eosinophils Absolute: 467 cells/uL (ref 15–500)
Eosinophils Relative: 7.3 %
HCT: 41.3 % (ref 35.0–45.0)
Hemoglobin: 13.5 g/dL (ref 11.7–15.5)
Lymphs Abs: 1357 cells/uL (ref 850–3900)
MCH: 29.9 pg (ref 27.0–33.0)
MCHC: 32.7 g/dL (ref 32.0–36.0)
MCV: 91.4 fL (ref 80.0–100.0)
MPV: 12.4 fL (ref 7.5–12.5)
Monocytes Relative: 8.9 %
Neutro Abs: 3936 cells/uL (ref 1500–7800)
Neutrophils Relative %: 61.5 %
Platelets: 229 10*3/uL (ref 140–400)
RBC: 4.52 10*6/uL (ref 3.80–5.10)
RDW: 11.9 % (ref 11.0–15.0)
Total Lymphocyte: 21.2 %
WBC: 6.4 10*3/uL (ref 3.8–10.8)

## 2022-01-14 LAB — COMPLETE METABOLIC PANEL WITH GFR
AG Ratio: 2 (calc) (ref 1.0–2.5)
ALT: 14 U/L (ref 6–29)
AST: 13 U/L (ref 10–35)
Albumin: 4.5 g/dL (ref 3.6–5.1)
Alkaline phosphatase (APISO): 59 U/L (ref 37–153)
BUN: 25 mg/dL (ref 7–25)
CO2: 27 mmol/L (ref 20–32)
Calcium: 9.6 mg/dL (ref 8.6–10.4)
Chloride: 103 mmol/L (ref 98–110)
Creat: 0.9 mg/dL (ref 0.50–1.05)
Globulin: 2.3 g/dL (calc) (ref 1.9–3.7)
Glucose, Bld: 146 mg/dL — ABNORMAL HIGH (ref 65–99)
Potassium: 4.5 mmol/L (ref 3.5–5.3)
Sodium: 139 mmol/L (ref 135–146)
Total Bilirubin: 0.6 mg/dL (ref 0.2–1.2)
Total Protein: 6.8 g/dL (ref 6.1–8.1)
eGFR: 71 mL/min/{1.73_m2} (ref >=60)

## 2022-01-14 LAB — SEDIMENTATION RATE: Sed Rate: 9 mm/h (ref 0–30)

## 2022-01-14 LAB — ANTI-DNA ANTIBODY, DOUBLE-STRANDED: ds DNA Ab: <1 IU/mL

## 2022-01-14 NOTE — Progress Notes (Signed)
Glucose is 146. Rest of CMP WNL.  UA revealed 3+ glucose.  CBC WNL. ESR WNL.  Complements WNL.

## 2022-01-15 NOTE — Progress Notes (Signed)
dsdNA is negative.  Labs are not consistent with a flare.

## 2022-02-03 ENCOUNTER — Encounter: Payer: Self-pay | Admitting: Internal Medicine

## 2022-02-03 ENCOUNTER — Ambulatory Visit: Payer: BC Managed Care – PPO | Admitting: Internal Medicine

## 2022-02-03 VITALS — BP 120/80 | HR 74 | Ht 62.0 in | Wt 173.2 lb

## 2022-02-03 DIAGNOSIS — E118 Type 2 diabetes mellitus with unspecified complications: Secondary | ICD-10-CM | POA: Diagnosis not present

## 2022-02-03 LAB — POCT GLYCOSYLATED HEMOGLOBIN (HGB A1C): Hemoglobin A1C: 9.8 % — AB (ref 4.0–5.6)

## 2022-02-03 LAB — POCT GLUCOSE (DEVICE FOR HOME USE): Glucose Fasting, POC: 139 mg/dL — AB (ref 70–99)

## 2022-02-03 MED ORDER — OZEMPIC (0.25 OR 0.5 MG/DOSE) 2 MG/1.5ML ~~LOC~~ SOPN: 0.5000 mg | PEN_INJECTOR | SUBCUTANEOUS | 1 refills | Status: DC

## 2022-02-03 MED ORDER — SYNJARDY 12.5-1000 MG PO TABS: 1.0000 | ORAL_TABLET | Freq: Two times a day (BID) | ORAL | 2 refills | Status: DC

## 2022-02-03 NOTE — Progress Notes (Signed)
?Name: Susan Daniels  ?MRN/ DOB: [id number], 09/26/1956   ?Age/ Sex: 66 y.o., female   ? ?PCP: Josetta Huddle, MD   ?Reason for Endocrinology Evaluation: Type 2 Diabetes Mellitus  ?   ?Date of Initial Endocrinology Visit: 02/03/2022   ? ? ?PATIENT IDENTIFIER: Susan Daniels is a 66 y.o. female with a past medical history of T2DM, autoimmune disease. The patient presented for initial endocrinology clinic visit on 02/03/2022 for consultative assistance with her diabetes management.  ? ? ?HPI: ?Susan Daniels was  ? ? ?Diagnosed with DM ~ 10 yrs  ?Prior Medications tried/Intolerance: Was on insulin . 2020 after discharge for MI . Started Ozempic 2021 ?Currently checking blood sugars 1-2 x / day,  ?Hypoglycemia episodes : no               ?Hemoglobin A1c  ranging from 8.0 %, peaking at 9.8% in 2023. ?Patient required assistance for hypoglycemia: no  ?Patient has required hospitalization within the last 1 year from hyper or hypoglycemia:  ? ?In terms of diet, the patient eats 3 meals a day, she is on Optiva 02/2021 and lost 30 lbs  ? ?She follows with Dr. Rockey Situ ( cardiology) ? ?She has noted abdominal pain with the urge to vomit  the day following ozempic , its short lived ? ? ?[place]: ?Synjardy 08-999 mg  ?Ozempic 0.5 mg weekly  ? ? ?Statin: yes ?ACE-I/ARB: yes ? ? ?METER DOWNLOAD SUMMARY: Date range evaluated:  ? ? ?DIABETIC COMPLICATIONS: ?Microvascular complications:  ? ?Denies: CKD, retinopathy  ?Last eye exam: Completed 02/20/2021 ? ?Macrovascular complications:  ?CAD ?Denies: PVD, CVA ? ? ?PAST HISTORY: ?Past Medical History:  ?Past Medical History:  ?Diagnosis Date  ? Arthritis   ? Rheumatoid  ? Diabetes mellitus without complication ([place])   ? Headache(784.0)   ? migraines  ? Heart murmur   ? ?Past Surgical History:  ?Past Surgical History:  ?Procedure Laterality Date  ? COLONOSCOPY WITH PROPOFOL N/A 01/30/2013  ? Procedure: COLONOSCOPY WITH PROPOFOL;  Surgeon: Garlan Fair, MD;  Location:  [facility];  Service: Endoscopy;  Laterality: N/A;  ? dental implant  09/29/2018  ? HEART STENT    ? TUBAL LIGATION    ?  ?Social History:  reports that she quit smoking about 42 years ago. Her smoking use included cigarettes. She has a 3.50 pack-year smoking history. She has never been exposed to tobacco smoke. She has never used smokeless tobacco. She reports current alcohol use. She reports that she does not use drugs. ?Family History:  ?Family History  ?Problem Relation Age of Onset  ? Healthy Mother   ? Heart disease Father   ? Rheumatic fever Father   ? Hypertension Sister   ? Rheumatic fever Sister   ? Healthy Son   ? Healthy Daughter   ? ? ? ?HOME MEDICATIONS: ?Allergies as of 02/03/2022   ? ?   Reactions  ? Aspirin   ? Epinephrine   ? Hydrocodone Nausea And Vomiting  ? ?  ? ?  ?Medication List  ?  ? ?  ? Accurate as of February 03, 2022  2:43 PM. If you have any questions, ask your nurse or doctor.  ?  ?  ? ?  ? ?STOP taking these medications   ? ?empagliflozin 10 MG Tabs tablet ?Commonly known as: JARDIANCE ?Stopped by: Dorita Sciara, MD ?  ?metFORMIN 500 MG 24 hr tablet ?Commonly known as: GLUCOPHAGE-XR ?Stopped by: Dorita Sciara,  MD ?  ? ?  ? ?TAKE these medications   ? ?allopurinol 100 MG tablet ?Commonly known as: ZYLOPRIM ?TAKE 1 TABLET BY MOUTH EVERY DAY ?  ?aspirin 81 MG chewable tablet ?Chew 81 mg by mouth daily. ?  ?B-D ULTRA-FINE 33 LANCETS Misc ?Use as directed to test blood glucose ?  ?OneTouch Delica Lancets 45G Misc ?CHECK BLOOD SUGAR TWICE A DAY DX E11.65 FINGERSTICK 90 DAYS ?  ?CALCIUM PO ?Take by mouth daily. ?  ?GlucoCom Blood Glucose Monitor Devi ?Use for glucose testing as directed ?  ?leflunomide 10 MG tablet ?Commonly known as: ARAVA ?TAKE 1 TABLET BY MOUTH EVERY DAY ?  ?lisinopril 2.5 MG tablet ?Commonly known as: ZESTRIL ?lisinopril 2.5 mg tablet ?  ?metoprolol tartrate 25 MG tablet ?Commonly known as: LOPRESSOR ?TAKE 1/2 TABLET BY MOUTH TWICE A DAY ?  ?nitroGLYCERIN 0.4  MG SL tablet ?Commonly known as: NITROSTAT ?Place 1 tablet (0.4 mg total) under the tongue as needed. ?  ?OneTouch Verio test strip ?Generic drug: glucose blood ?CHECK BLOOD SUGAR TWICE A DAY PRIOR TO MEAL ?  ?Ozempic (0.25 or 0.5 MG/DOSE) 2 MG/1.5ML Sopn ?Generic drug: Semaglutide(0.25 or 0.5MG/DOS) ?INJECT 0.25 MG SUBCUTANEOUS ONCE A WEEK FOR 4 WEEKS THEN 0.5 MG WEEKLY ?  ?rosuvastatin 10 MG tablet ?Commonly known as: CRESTOR ?Take 10 mg by mouth at bedtime. ?  ?Synjardy XR 08-999 MG Tb24 ?Generic drug: Empagliflozin-metFORMIN HCl ER ?Take 1 tablet by mouth daily. ?  ?VITAMIN D PO ?Take by mouth daily. ?  ? ?  ? ? ? ?ALLERGIES: ?Allergies  ?Allergen Reactions  ? Aspirin   ? Epinephrine   ? Hydrocodone Nausea And Vomiting  ? ? ? ?REVIEW OF SYSTEMS: ?A comprehensive ROS was conducted with the patient and is negative except as per HPI  ?  ?OBJECTIVE:  ? ?VITAL SIGNS: BP 120/80 (BP Location: Left Arm, Patient Position: Sitting, Cuff Size: Small)   Pulse 74   Ht _0  (1.575 m)   Wt 173 lb 3.2 oz (78.6 kg)   SpO2 96%   BMI 31.68 kg/m?   ? ?PHYSICAL EXAM:  ?General: Pt appears well and is in NAD  ?Neck: General: Supple without adenopathy or carotid bruits. ?Thyroid: Thyroid size normal.  No goiter or nodules appreciated.  ?Lungs: Clear with good BS bilat with no rales, rhonchi, or wheezes  ?Heart: RRR   ?Abdomen: Normoactive bowel sounds, soft, nontender, without masses or organomegaly palpable  ?Extremities:  ?Lower extremities - No pretibial edema. No lesions.  ?Neuro: MS is good with appropriate affect, pt is alert and Ox3  ? ? ?DM foot exam: 02/03/2022 ? ?The skin of the feet is intact without sores or ulcerations. ?The pedal pulses are 2+ on right and 2+ on left. ?The sensation is intact to a screening 5.07, 10 gram monofilament bilaterally ? ? ? ?DATA REVIEWED: ? ?Lab Results  ?Component Value Date  ? HGBA1C 9.8 (A) 02/03/2022  ? ?Lab Results  ?Component Value Date  ? CREATININE 0.90 01/13/2022  ? ? Latest  Reference Range & Units 01/13/22 10:50  ?Sodium 135 - 146 mmol/L 139  ?Potassium 3.5 - 5.3 mmol/L 4.5  ?Chloride 98 - 110 mmol/L 103  ?CO2 20 - 32 mmol/L 27  ?Glucose 65 - 99 mg/dL 146 (H)  ?BUN 7 - 25 mg/dL 25  ?Creatinine 0.50 - 1.05 mg/dL 0.90  ?Calcium 8.6 - 10.4 mg/dL 9.6  ?BUN/Creatinine Ratio 6 - 22 (calc) NOT APPLICABLE  ?eGFR > OR = 60 mL/min/1.33m  71  ?AG Ratio 1.0 - 2.5 (calc) 2.0  ?AST 10 - 35 U/L 13  ?ALT 6 - 29 U/L 14  ?Total Protein 6.1 - 8.1 g/dL 6.8  ?Total Bilirubin 0.2 - 1.2 mg/dL 0.6  ? ?ASSESSMENT / PLAN / RECOMMENDATIONS:  ? ?1) Type 2 Diabetes Mellitus, Poorly controlled, With Macrovascular  complications - Most recent A1c of 9.8 %. Goal A1c < 7.0 %.   ? ?Plan: ?GENERAL: ?I have discussed with the patient the pathophysiology of diabetes. We went over the natural progression of the disease. We talked about both insulin resistance and insulin deficiency. We stressed the importance of lifestyle changes including diet and exercise. I explained the complications associated with diabetes including retinopathy, nephropathy, neuropathy as well as increased risk of cardiovascular disease. We went over the benefit seen with glycemic control.  ? ?I explained to the patient that diabetic patients are at higher than normal risk for amputations.  ?She has been using Synjardy samples through her PCPs office, she is tolerating this well, will increase as below ?Would not increase Ozempic at this time due to nausea the day following Ozempic use ? ?MEDICATIONS: ?Increase Synjardy 12.03-999 mg, 2 tabs daily ?Continue Ozempic 0.5 mg weekly ? ?EDUCATION / INSTRUCTIONS: ?BG monitoring instructions: Patient is instructed to check her blood sugars 2 times a day, bedtime and fasting  ?Call [facility] if: BG persistently < 70 ?I reviewed the Rule of 15 for the treatment of hypoglycemia in detail with the patient. Literature supplied. ? ? ?2) Diabetic complications:  ?Eye: Does not have known diabetic  retinopathy.  ?Neuro/ Feet: Does not have known diabetic peripheral neuropathy. ?Renal: Patient does not have known baseline CKD. She is  on an ACEI/ARB at present. ? ? ? ? ?I spent 45 minutes preparing t

## 2022-02-03 NOTE — Patient Instructions (Signed)
-   Increase Synjardy 12.03-999 mg, TWO tablets daily  ?- Continue Ozempic 0.5 mg weekly  ? ? ?HOW TO TREAT LOW BLOOD SUGARS (Blood sugar LESS THAN 70 MG/DL) ?Please follow the RULE OF 15 for the treatment of hypoglycemia treatment (when your (blood sugars are less than 70 mg/dL)  ? ?STEP 1: Take 15 grams of carbohydrates when your blood sugar is low, which includes:  ?3-4 GLUCOSE TABS  OR ?3-4 OZ OF JUICE OR REGULAR SODA OR ?ONE TUBE OF GLUCOSE GEL   ? ?STEP 2: RECHECK blood sugar in 15 MINUTES ?STEP 3: If your blood sugar is still low at the 15 minute recheck --> then, go back to STEP 1 and treat AGAIN with another 15 grams of carbohydrates. ? ?

## 2022-02-21 IMAGING — US US CAROTID DUPLEX BILAT
1 series · 14 of 24 positions shown · non-contrast
Comparison: None.

CLINICAL DATA: Mixed hyperlipidemia

Hypertension
Diabetes
EXAM:
BILATERAL CAROTID DUPLEX ULTRASOUND
TECHNIQUE: Gray scale imaging, color Doppler and duplex ultrasound were
performed of bilateral carotid and vertebral arteries in the neck.

[Series 1: us carotid duplex bilat · 0.06mm/px · 14 of 71 slices shown]
[im 1/71]
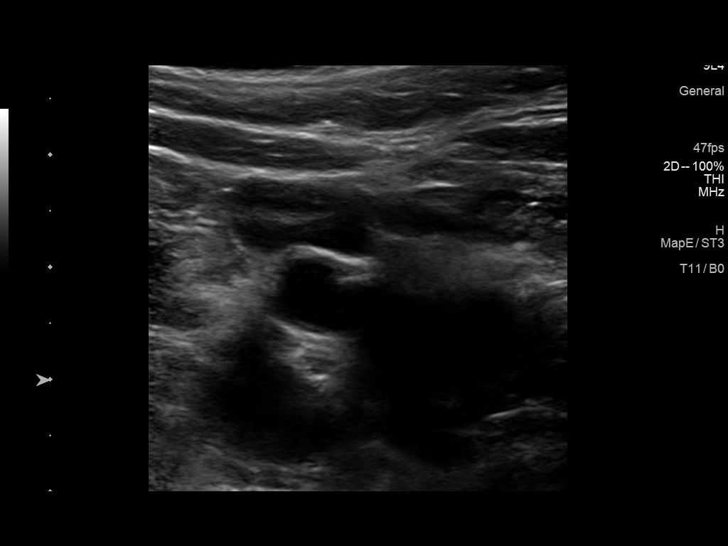
[im 7/71]
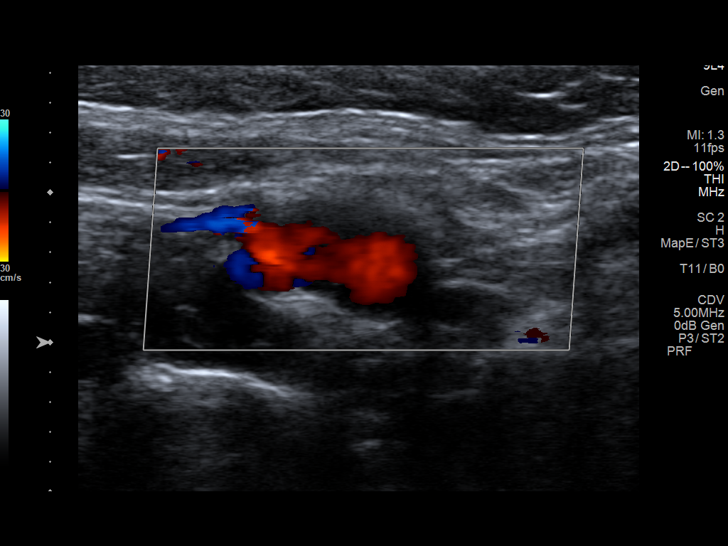
[im 13/71]
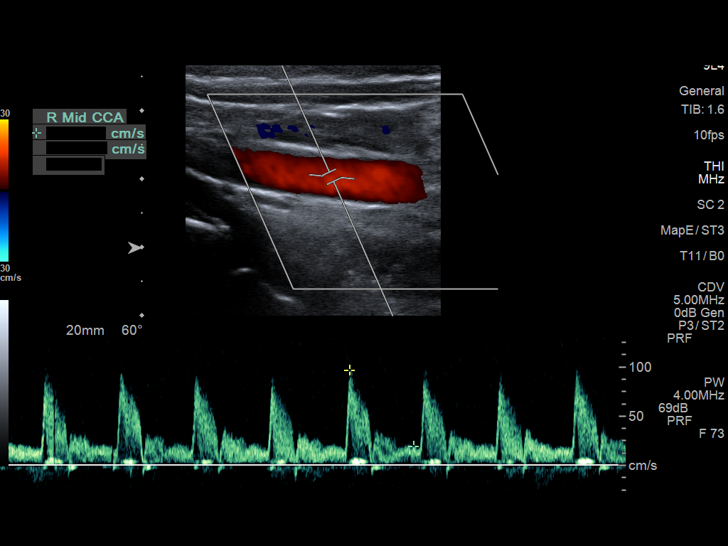
[im 19/71]
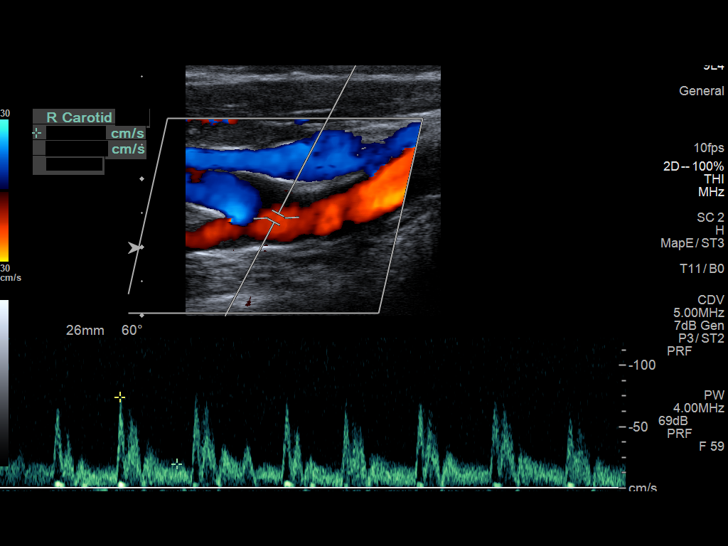
[im 22/71]
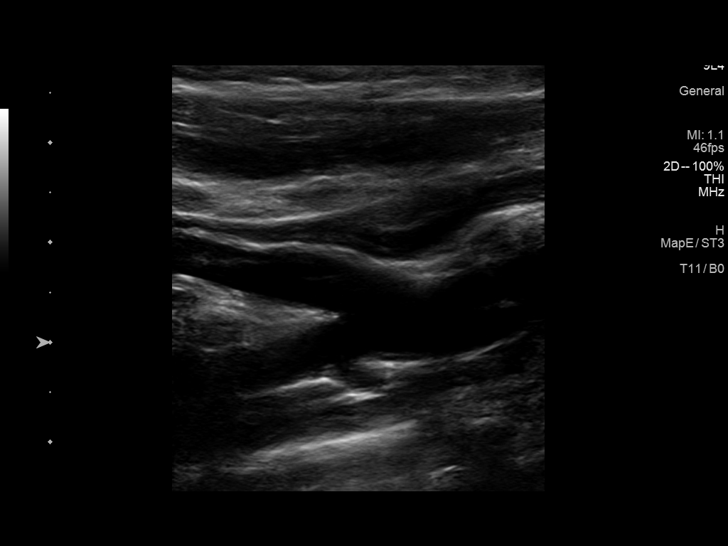
[im 28/71]
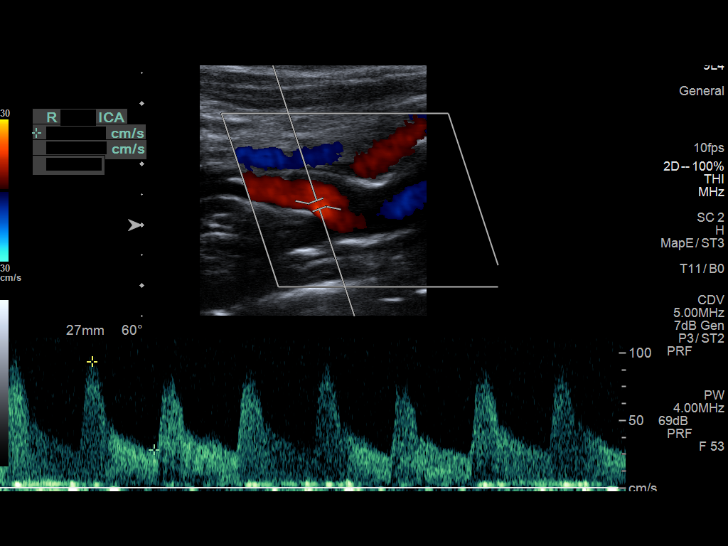
[im 34/71]
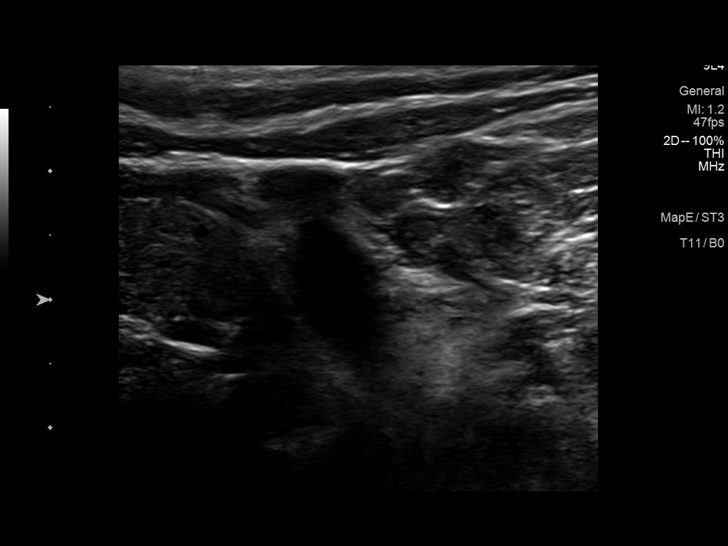
[im 37/71]
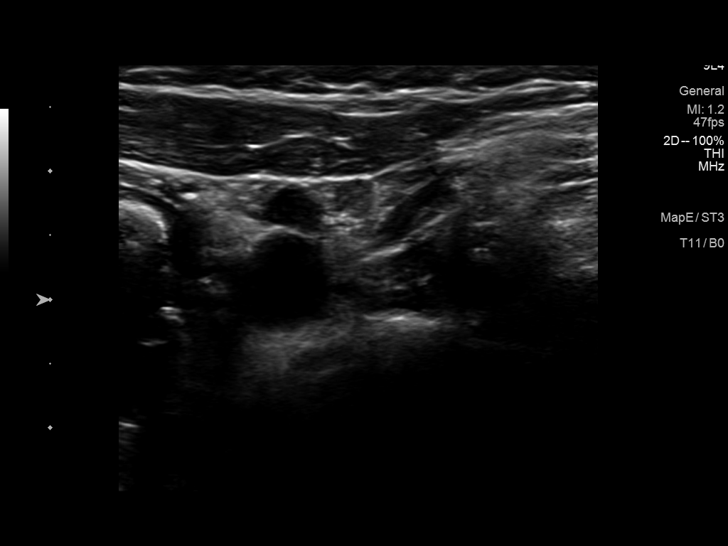
[im 43/71]
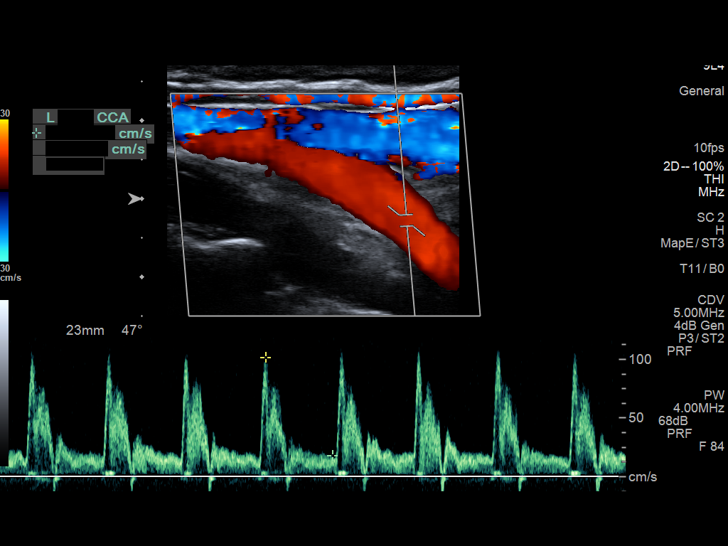
[im 49/71]
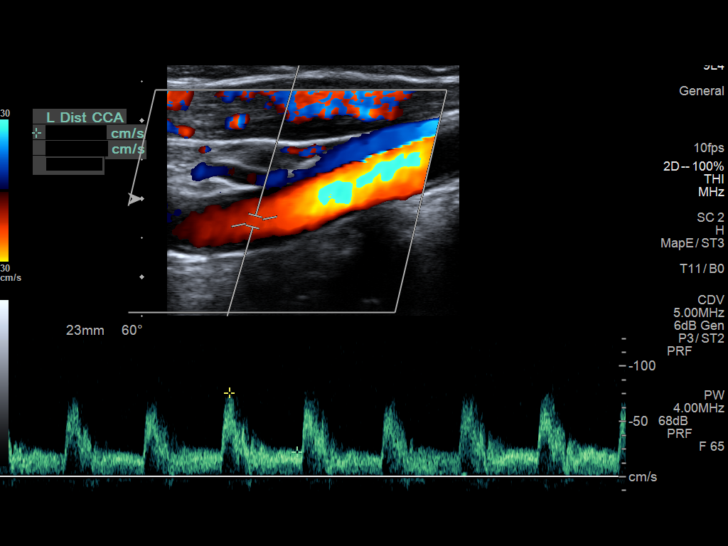
[im 55/71]
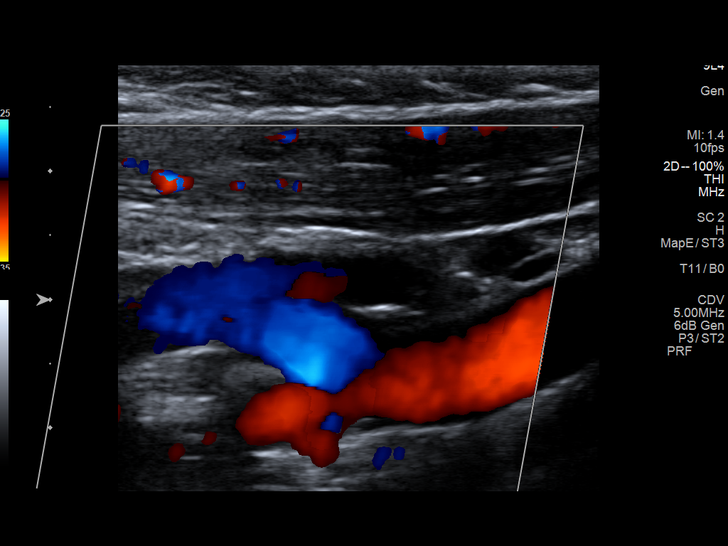
[im 58/71]
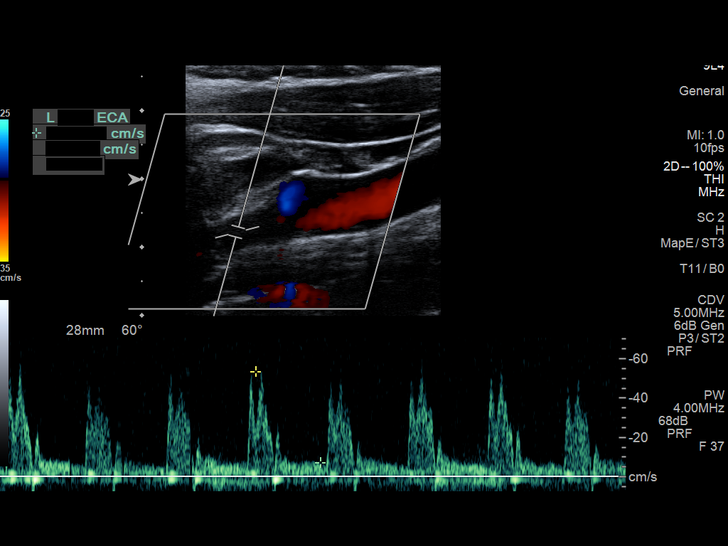
[im 64/71]
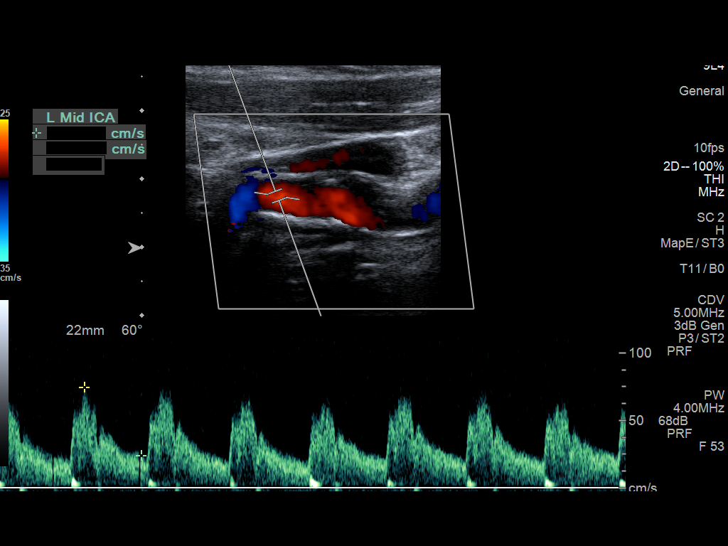
[im 71/71]
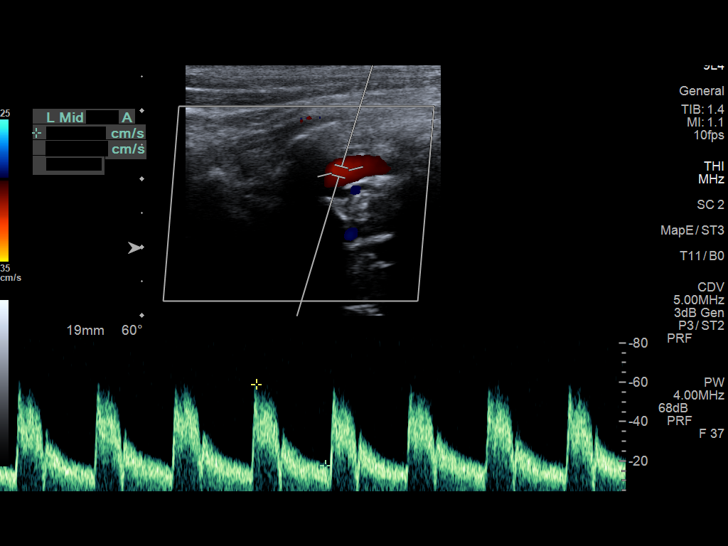

[14 of 24 positions shown; findings below may reference images not displayed]

FINDINGS: Criteria: Quantification of carotid stenosis is based on velocity
parameters that correlate the residual internal carotid diameter
with NASCET-based stenosis levels, using the diameter of the distal
internal carotid lumen as the denominator for stenosis measurement.

The following velocity measurements were obtained:

RIGHT

ICA: 97/37 cm/sec

CCA: 106/28 cm/sec

SYSTOLIC ICA/CCA RATIO:

ECA: 73 cm/sec

LEFT

ICA: 116/43 cm/sec

CCA: 120/22 cm/sec

SYSTOLIC ICA/CCA RATIO:

ECA: 54/7 cm/sec

RIGHT CAROTID ARTERY: No significant atheromatous plaque.

RIGHT VERTEBRAL ARTERY:  Antegrade flow.

LEFT CAROTID ARTERY:  No significant atheromatous plaque.

LEFT VERTEBRAL ARTERY:  Antegrade flow.
IMPRESSION: Less than 50% stenosis of the internal carotid arteries.

## 2022-02-23 ENCOUNTER — Other Ambulatory Visit: Payer: Self-pay | Admitting: Physician Assistant

## 2022-02-23 NOTE — Telephone Encounter (Signed)
Next Visit: 07/08/2022 ? ?Last Visit: 01/13/2022 ? ?Last Fill: 11/25/2021 ? 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 ?Current Dose per office note 01/13/2022: allopurinol 100 mg 1 tablet daily ? ?Labs: 01/13/2022 Glucose is 146. Rest of CMP WNL  CBC WNL. ? ?Okay to refill Allopurinol?  ?

## 2022-03-16 ENCOUNTER — Telehealth: Payer: Self-pay | Admitting: Cardiovascular Disease

## 2022-03-16 NOTE — Telephone Encounter (Signed)
Pt c/o of Chest Pain: STAT if CP now or developed within 24 hours ? ?1. Are you having CP right now? no ? ?2. Are you experiencing any other symptoms (ex. SOB, nausea, vomiting, sweating)? No  ? ?3. How long have you been experiencing CP? About a week.  ? ?4. Is your CP continuous or coming and going? Comes and goes  ? ?5. Have you taken Nitroglycerin? No ? ?Patient states she has pain under left breast area.  It happens when she bends over or reaching, it doesn't happen every time.   ??  ?

## 2022-03-17 NOTE — Telephone Encounter (Signed)
Called and spoke with patient.  ? ?Patient reports that she doesn't remember injuring herself in any way, and the area doesn't hurt when pressed.  ? ?Advised patient that given that the pain comes with movement, it likely is more musculoskeletal in nature than cardiac, and if it continues she may want to see her PCP for exam.  ? ?Pt verbalized understanding and voiced appreciation for the call.  ?

## 2022-04-02 ENCOUNTER — Other Ambulatory Visit: Payer: Self-pay | Admitting: Physician Assistant

## 2022-04-02 DIAGNOSIS — M359 Systemic involvement of connective tissue, unspecified: Secondary | ICD-10-CM

## 2022-04-02 NOTE — Telephone Encounter (Signed)
Next Visit: 07/08/2022  Last Visit: 01/13/2022  Last Fill: 01/04/2022  DX: Autoimmune disease   Current Dose per office note 01/13/2022:  Arava 10 mg 1 tablet by mouth daily  Labs: 01/13/2022, Glucose is 146. Rest of CMP WNL.  UA revealed 3+ glucose.  CBC WNL. ESR WNL.  Complements WNL. dsdNA is negative.  Labs are not consistent with a flare.   Okay to refill Arava?

## 2022-04-14 ENCOUNTER — Encounter: Payer: Self-pay | Admitting: Cardiovascular Disease

## 2022-05-19 ENCOUNTER — Encounter: Payer: Self-pay | Admitting: Internal Medicine

## 2022-05-19 ENCOUNTER — Ambulatory Visit: Payer: BC Managed Care – PPO | Admitting: Internal Medicine

## 2022-05-19 VITALS — BP 120/80 | HR 64 | Ht 62.0 in | Wt 175.4 lb

## 2022-05-19 DIAGNOSIS — E118 Type 2 diabetes mellitus with unspecified complications: Secondary | ICD-10-CM | POA: Diagnosis not present

## 2022-05-19 DIAGNOSIS — E119 Type 2 diabetes mellitus without complications: Secondary | ICD-10-CM | POA: Insufficient documentation

## 2022-05-19 DIAGNOSIS — E1165 Type 2 diabetes mellitus with hyperglycemia: Secondary | ICD-10-CM

## 2022-05-19 DIAGNOSIS — Z794 Long term (current) use of insulin: Secondary | ICD-10-CM | POA: Diagnosis not present

## 2022-05-19 DIAGNOSIS — E1159 Type 2 diabetes mellitus with other circulatory complications: Secondary | ICD-10-CM

## 2022-05-19 LAB — POCT GLYCOSYLATED HEMOGLOBIN (HGB A1C): Hemoglobin A1C: 8.5 % — AB (ref 4.0–5.6)

## 2022-05-19 LAB — POCT GLUCOSE (DEVICE FOR HOME USE): Glucose Fasting, POC: 175 mg/dL — AB (ref 70–99)

## 2022-05-19 NOTE — Patient Instructions (Signed)
-   Continue  Synjardy 12.03-999 mg, TWO tablets daily  - Continue Ozempic 0.5 mg weekly    HOW TO TREAT LOW BLOOD SUGARS (Blood sugar LESS THAN 70 MG/DL) Please follow the RULE OF 15 for the treatment of hypoglycemia treatment (when your (blood sugars are less than 70 mg/dL)   STEP 1: Take 15 grams of carbohydrates when your blood sugar is low, which includes:  3-4 GLUCOSE TABS  OR 3-4 OZ OF JUICE OR REGULAR SODA OR ONE TUBE OF GLUCOSE GEL    STEP 2: RECHECK blood sugar in 15 MINUTES STEP 3: If your blood sugar is still low at the 15 minute recheck --> then, go back to STEP 1 and treat AGAIN with another 15 grams of carbohydrates.

## 2022-05-19 NOTE — Progress Notes (Signed)
Name: Susan Daniels  MRN/ DOB: [id number], 1956/04/03   Age/ Sex: 66 y.o., female    PCP: Josetta Huddle, MD   Reason for Endocrinology Evaluation: Type 2 Diabetes Mellitus     Date of Initial Endocrinology Visit: 02/03/2022    PATIENT IDENTIFIER: Susan Daniels is a 66 y.o. female with a past medical history of T2DM, autoimmune disease. The patient presented for initial endocrinology clinic visit on 02/03/2022 for consultative assistance with her diabetes management.    HPI: Ms. Buttler was    Diagnosed with DM ~ 10 yrs  Prior Medications tried/Intolerance: Was on insulin . 2020 after discharge for MI . Started Ozempic 2021          Hemoglobin A1c  ranging from 8.0 %, peaking at 9.8% in 2023.   She follows with Dr. Rockey Situ ( cardiology)  She has noted abdominal pain with the urge to vomit  the day following ozempic , its short lived    SUBJECTIVE:   During the last visit (02/03/2022): A1c 9.8 %   Today (05/19/22): Mr. Vessels is here for a follow up on diabetes management   She checks checks her blood sugars 1 times daily. The patient has not had hypoglycemic episodes since the last clinic visit.   Weight stable  Denies nausea,  or diarrhea  She is concerned about increasing Ozempic due to risk of GI side effects, boy friend was on Ozempic 2 mg but was vomiting       HOME DIABETES REGIMEN: Synjardy 12.03-999 mg , BID  Ozempic 0.5 mg weekly    Statin: yes ACE-I/ARB: yes   METER DOWNLOAD SUMMARY: did not bring    DIABETIC COMPLICATIONS: Microvascular complications:   Denies: CKD, retinopathy  Last eye exam: Completed 02/20/2021  Macrovascular complications:  CAD Denies: PVD, CVA   PAST HISTORY: Past Medical History:  Past Medical History:  Diagnosis Date   Arthritis    Rheumatoid   Diabetes mellitus without complication (HCC)    [id number](539.7)    migraines   Heart murmur    Past Surgical History:  Past Surgical History:   Procedure Laterality Date   COLONOSCOPY WITH PROPOFOL N/A 01/30/2013   Procedure: COLONOSCOPY WITH PROPOFOL;  Surgeon: Garlan Fair, MD;  Location: [facility];  Service: Endoscopy;  Laterality: N/A;   dental implant  09/29/2018   HEART STENT     TUBAL LIGATION      Social History:  reports that she quit smoking about 42 years ago. Her smoking use included cigarettes. She has a 3.50 pack-year smoking history. She has never been exposed to tobacco smoke. She has never used smokeless tobacco. She reports current alcohol use. She reports that she does not use drugs. Family History:  Family History  Problem Relation Age of Onset   Healthy Mother    Heart disease Father    Rheumatic fever Father    Hypertension Sister    Rheumatic fever Sister    Healthy Son    Healthy Daughter      HOME MEDICATIONS: Allergies as of 05/19/2022       Reactions   Aspirin    Epinephrine    Hydrocodone Nausea And Vomiting        Medication List        Accurate as of May 19, 2022  7:22 AM. If you have any questions, ask your nurse or doctor.          allopurinol 100 MG tablet Commonly known as: ZYLOPRIM  TAKE 1 TABLET BY MOUTH EVERY DAY   aspirin 81 MG chewable tablet Chew 81 mg by mouth daily.   B-D ULTRA-FINE 33 LANCETS Misc Use as directed to test blood glucose   OneTouch Delica Lancets 68Y Misc CHECK BLOOD SUGAR TWICE A DAY DX E11.65 FINGERSTICK 90 DAYS   CALCIUM PO Take by mouth daily.   GlucoCom Blood Glucose Monitor Devi Use for glucose testing as directed   leflunomide 10 MG tablet Commonly known as: ARAVA TAKE 1 TABLET BY MOUTH EVERY DAY   lisinopril 2.5 MG tablet Commonly known as: ZESTRIL lisinopril 2.5 mg tablet   metoprolol tartrate 25 MG tablet Commonly known as: LOPRESSOR TAKE 1/2 TABLET BY MOUTH TWICE A DAY   nitroGLYCERIN 0.4 MG SL tablet Commonly known as: NITROSTAT Place 1 tablet (0.4 mg total) under the tongue as needed.   OneTouch Verio test  strip Generic drug: glucose blood CHECK BLOOD SUGAR TWICE A DAY PRIOR TO MEAL   Ozempic (0.25 or 0.5 MG/DOSE) 2 MG/1.5ML Sopn Generic drug: Semaglutide(0.25 or 0.5MG /DOS) Inject 0.5 mg into the skin once a week.   rosuvastatin 10 MG tablet Commonly known as: CRESTOR Take 10 mg by mouth at bedtime.   Synjardy 12.03-999 MG Tabs Generic drug: Empagliflozin-metFORMIN HCl Take 1 tablet by mouth 2 (two) times daily.   VITAMIN D PO Take by mouth daily.         ALLERGIES: Allergies  Allergen Reactions   Aspirin    Epinephrine    Hydrocodone Nausea And Vomiting     REVIEW OF SYSTEMS: A comprehensive ROS was conducted with the patient and is negative except as per HPI    OBJECTIVE:   VITAL SIGNS: There were no vitals taken for this visit.   PHYSICAL EXAM:  General: Pt appears well and is in NAD  Neck: General: Supple without adenopathy or carotid bruits. Thyroid: Thyroid size normal.  No goiter or nodules appreciated.  Lungs: Clear with good BS bilat with no rales, rhonchi, or wheezes  Heart: RRR   Abdomen: Normoactive bowel sounds, soft, nontender, without masses or organomegaly palpable  Extremities:  Lower extremities - No pretibial edema. No lesions.  Neuro: MS is good with appropriate affect, pt is alert and Ox3    DM foot exam: 02/03/2022  The skin of the feet is intact without sores or ulcerations. The pedal pulses are 2+ on right and 2+ on left. The sensation is intact to a screening 5.07, 10 gram monofilament bilaterally    DATA REVIEWED:  Lab Results  Component Value Date   HGBA1C 9.8 (A) 02/03/2022   Lab Results  Component Value Date   CREATININE 0.90 01/13/2022    Latest Reference Range & Units 01/13/22 10:50  Sodium 135 - 146 mmol/L 139  Potassium 3.5 - 5.3 mmol/L 4.5  Chloride 98 - 110 mmol/L 103  CO2 20 - 32 mmol/L 27  Glucose 65 - 99 mg/dL 146 (H)  BUN 7 - 25 mg/dL 25  Creatinine 0.50 - 1.05 mg/dL 0.90  Calcium 8.6 - 10.4 mg/dL 9.6   BUN/Creatinine Ratio 6 - 22 (calc) NOT APPLICABLE  eGFR > OR = 60 mL/min/1.3m2 71  AG Ratio 1.0 - 2.5 (calc) 2.0  AST 10 - 35 U/L 13  ALT 6 - 29 U/L 14  Total Protein 6.1 - 8.1 g/dL 6.8  Total Bilirubin 0.2 - 1.2 mg/dL 0.6   ASSESSMENT / PLAN / RECOMMENDATIONS:   1) Type 2 Diabetes Mellitus, Poorly controlled, With Macrovascular  complications -  Most recent A1c of 8.5 %. Goal A1c < 7.0 %.    - A1c down from 9.8%  - She is concerned about increasing the dose of Ozempic as her BF developed vomiting - We also discussed adding another agent , if she wishes not to increase Ozempic but she would like a chance to work on lifestyle changes at this time  - Discussed importance of keeping ozempic pen refrigerated   MEDICATIONS: Continue Synjardy 12.03-999 mg, 2 tabs daily Continue Ozempic 0.5 mg weekly  EDUCATION / INSTRUCTIONS: BG monitoring instructions: Patient is instructed to check her blood sugars 2 times a day, bedtime and fasting  Call [place] [place] [facility] if: BG persistently < 70 I reviewed the Rule of 15 for the treatment of hypoglycemia in detail with the patient. Literature supplied.   2) Diabetic complications:  Eye: Does not have known diabetic retinopathy.  Neuro/ Feet: Does not have known diabetic peripheral neuropathy. Renal: Patient does not have known baseline CKD. She is  on an ACEI/ARB at present.  F/U in 4 months   Signed electronically by: Mack Guise, MD  [facility]  [address] [place], [place] [address] Phone: [phone] FAX: [phone]   CC: Josetta Huddle, MD [address] [place] [address] [address] Phone: [phone]  Fax: [phone]    Return to Endocrinology clinic as below: Future Appointments  Date Time Provider [place]  05/19/2022  7:50 AM Kamera Dubas, Melanie Crazier, MD [facility] None  07/08/2022  8:00 AM Bo Merino, MD [facility] None   08/16/2022  4:00 PM Gollan, Kathlene November, MD [facility]

## 2022-05-26 ENCOUNTER — Other Ambulatory Visit: Payer: Self-pay | Admitting: Physician Assistant

## 2022-05-26 NOTE — Telephone Encounter (Signed)
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 Rest of CMP WNL  CBC WNL.   Okay to refill Allopurinol?

## 2022-06-24 NOTE — Progress Notes (Signed)
Office Visit Note  Patient: [name]             Date of Birth: 11-05-1956           MRN: [id number]             PCP: Josetta Huddle, MD Referring: Josetta Huddle, MD Visit Date: 07/08/2022 Occupation: '@GUAROCC'$ @  Subjective:  Medication management  History of Present Illness: Susan Daniels is a 66 y.o. female history of autoimmune disease, osteoarthritis and osteopenia.  She states she has not experienced any joint pain or joint swelling.  She has been tolerating leflunomide 10 mg p.o. daily without any side effects.  She denies any history of oral ulcers, nasal ulcers, malar rash, Raynaud's phenomenon or lymphadenopathy.  She gives history of hair loss and photosensitivity which has not changed.  She continues to have some stiffness in her hands.  She states she had good response to viscosupplement injections to her knee joints without any recurrence of pain.  She has bunions in her feet but the discomfort is manageable with the shoes.  She has not had any gout flare.  She has been taking allopurinol 100 mg p.o. daily.  Activities of Daily Living:  Patient reports morning stiffness for 0  none .   Patient Denies nocturnal pain.  Difficulty dressing/grooming: Denies Difficulty climbing stairs: Denies Difficulty getting out of chair: Denies Difficulty using hands for taps, buttons, cutlery, and/or writing: Denies  Review of Systems  Constitutional:  Negative for fatigue.  HENT:  Negative for mouth sores and mouth dryness.   Eyes:  Negative for dryness.  Respiratory:  Negative for shortness of breath.   Cardiovascular:  Negative for chest pain and palpitations.  Gastrointestinal:  Negative for blood in stool, constipation and diarrhea.  Endocrine: Negative for increased urination.  Genitourinary:  Negative for involuntary urination.  Musculoskeletal:  Negative for joint pain, gait problem, joint pain, joint swelling, myalgias, muscle weakness, morning stiffness, muscle  tenderness and myalgias.  Skin:  Positive for hair loss and sensitivity to sunlight. Negative for color change and rash.  Allergic/Immunologic: Negative for susceptible to infections.  Neurological:  Negative for dizziness and headaches.  Hematological:  Negative for swollen glands.  Psychiatric/Behavioral:  Positive for sleep disturbance. Negative for depressed mood. The patient is not nervous/anxious.     PMFS History:  Patient Active Problem List   Diagnosis Date Noted   Diabetes mellitus ([place]) 05/19/2022   Type 2 diabetes mellitus with hyperglycemia, without long-term current use of insulin ([place]) 05/19/2022   CAD (coronary artery disease), native coronary artery 06/16/2020   Diabetes mellitus type 2 with complications ([place]) 61/44/3154   Autoimmune disease ([place]) 06/03/2017   High risk medication use 06/03/2017   Primary osteoarthritis of both hands 06/03/2017   Primary osteoarthritis of both knees 06/03/2017   Primary osteoarthritis of both feet 06/03/2017   Heart murmur 06/03/2017   History of hypothyroidism 06/03/2017   Dyslipidemia 06/03/2017   History of diabetes mellitus 06/03/2017   Vitamin D deficiency 06/03/2017    Past Medical History:  Diagnosis Date   Arthritis    Rheumatoid   Diabetes mellitus without complication (HCC)    MGQQPYPP(509.3)    migraines   Heart murmur     Family History  Problem Relation Age of Onset   Healthy Mother    Heart disease Father    Rheumatic fever Father    Hypertension Sister    Rheumatic fever Sister    Healthy Son  Healthy Daughter    Past Surgical History:  Procedure Laterality Date   COLONOSCOPY WITH PROPOFOL N/A 01/30/2013   Procedure: COLONOSCOPY WITH PROPOFOL;  Surgeon: Garlan Fair, MD;  Location: [facility];  Service: Endoscopy;  Laterality: N/A;   dental implant  09/29/2018   HEART STENT     TUBAL LIGATION     Social History   Social History Narrative   Not on file   Immunization History   Administered Date(s) Administered   PFIZER(Purple Top)SARS-COV-2 Vaccination 01/29/2020, 02/19/2020, 10/10/2020     Objective: Vital Signs: BP 124/76 (BP Location: Left Arm, Patient Position: Sitting, Cuff Size: Normal)   Pulse 88   Resp 15   Ht '5\' 2"'$  (1.575 m)   Wt 174 lb (78.9 kg)   BMI 31.83 kg/m    Physical Exam Vitals and nursing note reviewed.  Constitutional:      Appearance: She is well-developed.  HENT:     Head: Normocephalic and atraumatic.  Eyes:     Conjunctiva/sclera: Conjunctivae normal.  Cardiovascular:     Rate and Rhythm: Normal rate and regular rhythm.     Heart sounds: Normal heart sounds.  Pulmonary:     Effort: Pulmonary effort is normal.     Breath sounds: Normal breath sounds.  Abdominal:     General: Bowel sounds are normal.     Palpations: Abdomen is soft.  Musculoskeletal:     Cervical back: Normal range of motion.  Lymphadenopathy:     Cervical: No cervical adenopathy.  Skin:    General: Skin is warm and dry.     Capillary Refill: Capillary refill takes less than 2 seconds.  Neurological:     Mental Status: She is alert and oriented to person, place, and time.  Psychiatric:        Behavior: Behavior normal.      Musculoskeletal Exam: Cervical spine was in good range of motion.  There was mild thoracic kyphosis.  Shoulder joints, elbow joints, wrist joints, MCPs PIPs and DIPs with good range of motion with no synovitis.  She had bilateral PIP and DIP thickening.  Hip joints and knee joints with good range of motion without any warmth swelling or effusion.  She had bilateral bunions without any synovitis.  CDAI Exam: CDAI Score: -- Patient Global: --; Provider Global: -- Swollen: --; Tender: -- Joint Exam 07/08/2022   No joint exam has been documented for this visit   There is currently no information documented on the homunculus. Go to the Rheumatology activity and complete the homunculus joint exam.  Investigation: No additional  findings.  Imaging: No results found.  Recent Labs: Lab Results  Component Value Date   WBC 6.4 01/13/2022   HGB 13.5 01/13/2022   PLT 229 01/13/2022   NA 139 01/13/2022   K 4.5 01/13/2022   CL 103 01/13/2022   CO2 27 01/13/2022   GLUCOSE 146 (H) 01/13/2022   BUN 25 01/13/2022   CREATININE 0.90 01/13/2022   BILITOT 0.6 01/13/2022   ALKPHOS 180 (H) 05/18/2019   AST 13 01/13/2022   ALT 14 01/13/2022   PROT 6.8 01/13/2022   ALBUMIN 3.8 05/18/2019   CALCIUM 9.6 01/13/2022   GFRAA 68 05/05/2021   QFTBGOLDPLUS NEGATIVE 03/02/2021    Speciality Comments: PLQ eye exam: 09/08/2020 WNL [place] Follow up in 6 months. Abnormal OCT.  Started Plaquenil at age 15  Procedures:  No procedures performed Allergies: Aspirin, Epinephrine, and Hydrocodone   Assessment / Plan:  Visit Diagnoses: Autoimmune disease ([place]) - History of inflammatory arthritis, positive ANA (ENA, C3-C4, anticardiolipin, beta-2, lupus anticoagulant was negative. ANA was 1: 320 speckled: She denies any history of oral ulcers, nasal ulcers, malar rash, Raynaud's phenomenon or lymphadenopathy.  She gives history of photosensitivity and hair loss which is a change.  She has been taking leflunomide on a regular basis.  She denies any history of joint inflammation.  Labs obtained on January 13, 2022 sedimentation rate, complements and double-stranded DNA were all within normal limits.  Patient was advised to get labs today.  Prescription refill for leflunomide was sent.  High risk medication use - Arava 10 mg 1 tablet by mouth daily.  Discontinued Plaquenil due to toxic maculopathy.  Last labs were in March 2023 CBC with differential and CMP with GFR were normal.  I discussed the need for getting labs every 3 months.  We will get labs today and then every 3 months to monitor for drug toxicity.  Information regarding immunization was placed in the AVS.  She was advised to hold leflunomide if she develops an infection and  resume after the infection resolves.  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 Coralyn Pear.   Primary osteoarthritis of both hands-she had bilateral PIP and DIP thickening.  Joint protection muscle strengthening was discussed.  Primary osteoarthritis of both knees - Right knee- Visco series January/February 2020.  She had good response to Visco supplement injection.  She had no warmth swelling or effusion.  Lower extremity muscle strengthening exercises were discussed.  Primary osteoarthritis of both feet-she is bilateral bunions and osteoarthritis.  Proper fitting shoes were advised.  Osteopenia of multiple sites - 04/02/20 DEXA scan showed a T score of -1.4, BMD 0.698 in the right femoral neck.  Patient was advised to schedule a DEXA scan appointment.  The orders were placed at the last visit.  Use of calcium rich diet and vitamin D was advised.  Patient has been taking vitamin D supplement.  Chronic idiopathic gout involving toe of right foot without tophus - allopurinol 100 mg 1 tablet daily. Uric acid: 10/08/2021 3.2.  She has not had a gout flare.  We will check uric acid level today.  History of vitamin D deficiency-she is advised to continue vitamin D.  Dyslipidemia-she Crestor.  Increased risk of heart disease with autoimmune disease was discussed.  Dietary modifications and exercise was emphasized.  Heart murmur  History of diabetes mellitus  History of hypothyroidism    Orders: Orders Placed This Encounter  Procedures   CBC with Differential/Platelet   COMPLETE METABOLIC PANEL WITH GFR   Uric acid   Meds ordered this encounter  Medications   leflunomide (ARAVA) 10 MG tablet    Sig: Take 1 tablet (10 mg total) by mouth daily.    Dispense:  90 tablet    Refill:  0    Follow-Up Instructions: Return in about 5 months (around 12/08/2022) for Autoimmune disease.   Bo Merino,  MD  Note - This record has been created using Editor, commissioning.  Chart creation errors have been sought, but may not always  have been located. Such creation errors do not reflect on  the standard of medical care.

## 2022-06-28 ENCOUNTER — Ambulatory Visit: Payer: BC Managed Care – PPO | Admitting: Cardiovascular Disease

## 2022-07-01 ENCOUNTER — Other Ambulatory Visit: Payer: Self-pay | Admitting: Cardiovascular Disease

## 2022-07-01 ENCOUNTER — Other Ambulatory Visit: Payer: Self-pay | Admitting: Physician Assistant

## 2022-07-01 DIAGNOSIS — M359 Systemic involvement of connective tissue, unspecified: Secondary | ICD-10-CM

## 2022-07-08 ENCOUNTER — Ambulatory Visit: Payer: BC Managed Care – PPO | Attending: Rheumatology | Admitting: Rheumatology

## 2022-07-08 ENCOUNTER — Encounter: Payer: Self-pay | Admitting: Rheumatology

## 2022-07-08 VITALS — BP 124/76 | HR 88 | Resp 15 | Ht 62.0 in | Wt 174.0 lb

## 2022-07-08 DIAGNOSIS — M19041 Primary osteoarthritis, right hand: Secondary | ICD-10-CM

## 2022-07-08 DIAGNOSIS — M170 Bilateral primary osteoarthritis of knee: Secondary | ICD-10-CM

## 2022-07-08 DIAGNOSIS — M359 Systemic involvement of connective tissue, unspecified: Secondary | ICD-10-CM | POA: Diagnosis not present

## 2022-07-08 DIAGNOSIS — T372X5A Adverse effect of antimalarials and drugs acting on other blood protozoa, initial encounter: Secondary | ICD-10-CM

## 2022-07-08 DIAGNOSIS — Z780 Asymptomatic menopausal state: Secondary | ICD-10-CM

## 2022-07-08 DIAGNOSIS — M19072 Primary osteoarthritis, left ankle and foot: Secondary | ICD-10-CM

## 2022-07-08 DIAGNOSIS — M1A0710 Idiopathic chronic gout, right ankle and foot, without tophus (tophi): Secondary | ICD-10-CM

## 2022-07-08 DIAGNOSIS — Z79899 Other long term (current) drug therapy: Secondary | ICD-10-CM | POA: Diagnosis not present

## 2022-07-08 DIAGNOSIS — H35389 Toxic maculopathy, unspecified eye: Secondary | ICD-10-CM

## 2022-07-08 DIAGNOSIS — M19071 Primary osteoarthritis, right ankle and foot: Secondary | ICD-10-CM

## 2022-07-08 DIAGNOSIS — E785 Hyperlipidemia, unspecified: Secondary | ICD-10-CM

## 2022-07-08 DIAGNOSIS — R011 Cardiac murmur, unspecified: Secondary | ICD-10-CM

## 2022-07-08 DIAGNOSIS — M19042 Primary osteoarthritis, left hand: Secondary | ICD-10-CM

## 2022-07-08 DIAGNOSIS — M8589 Other specified disorders of bone density and structure, multiple sites: Secondary | ICD-10-CM

## 2022-07-08 DIAGNOSIS — Z8639 Personal history of other endocrine, nutritional and metabolic disease: Secondary | ICD-10-CM

## 2022-07-08 MED ORDER — LEFLUNOMIDE 10 MG PO TABS: 10.0000 mg | ORAL_TABLET | Freq: Every day | ORAL | 0 refills | Status: DC

## 2022-07-08 NOTE — Patient Instructions (Signed)
Standing Labs We placed an order today for your standing lab work.   Please have your standing labs drawn in December and every 3 months  If possible, please have your labs drawn 2 weeks prior to your appointment so that the provider can discuss your results at your appointment.  Please note that you may see your imaging and lab results in [place] before we have reviewed them. We may be awaiting multiple results to interpret others before contacting you. Please allow our office up to 72 hours to thoroughly review all of the results before contacting the office for clarification of your results.  We currently have open lab daily: Monday through Thursday from 1:30 PM-4:30 PM and Friday from 1:30 PM- 4:00 PM If possible, please come for your lab work on Monday, Thursday or Friday afternoons, as you may experience shorter wait times.   Effective September 08, 2022 the new lab hours will change to: Monday through Thursday from 1:30 PM-5:00 PM and Friday from 8:30 AM-12:00 PM If possible, please come for your lab work on Monday and Thursday afternoons, as you may experience shorter wait times.  Please be advised, all patients with office appointments requiring lab work will take precedent over walk-in lab work.    The office is located at [address], [place], [place], [place] [address] No appointment is necessary.   Labs are drawn by [facility]. Please bring your co-pay at the time of your lab draw.  You may receive a bill from [facility] for your lab work.  Please note if you are on Hydroxychloroquine and and an order has been placed for a Hydroxychloroquine level, you will need to have it drawn 4 hours or more after your last dose.  If you wish to have your labs drawn at another location, please call the office 24 hours in advance to send orders.  If you have any questions regarding directions or hours of operation,  please call [phone].   As a reminder, please drink plenty of water prior  to coming for your lab work. Thanks!   Vaccines You are taking a medication(s) that can suppress your immune system.  The following immunizations are recommended: Flu annually Covid-19  Td/Tdap (tetanus, diphtheria, pertussis) every 10 years Pneumonia (Prevnar 15 then Pneumovax 23 at least 1 year apart.  Alternatively, can take Prevnar 20 without needing additional dose) Shingrix: 2 doses from 4 weeks to 6 months apart  Please check with your PCP to make sure you are up to date.   If you have signs or symptoms of an infection or start antibiotics: First, call your PCP for workup of your infection. Hold your medication through the infection, until you complete your antibiotics, and until symptoms resolve if you take the following: Injectable medication (Actemra, Benlysta, Cimzia, Cosentyx, Enbrel, Humira, Kevzara, Orencia, Remicade, Simponi, Stelara, Taltz, Tremfya) Methotrexate Leflunomide (Arava) Mycophenolate (Cellcept) Morrie Sheldon, Olumiant, or Rinvoq

## 2022-07-09 LAB — CBC WITH DIFFERENTIAL/PLATELET
Absolute Monocytes: 610 cells/uL (ref 200–950)
Basophils Absolute: 79 cells/uL (ref 0–200)
Basophils Relative: 1.3 %
Eosinophils Absolute: 342 cells/uL (ref 15–500)
Eosinophils Relative: 5.6 %
HCT: 40.4 % (ref 35.0–45.0)
Hemoglobin: 13.2 g/dL (ref 11.7–15.5)
Lymphs Abs: 1360 cells/uL (ref 850–3900)
MCH: 29.6 pg (ref 27.0–33.0)
MCHC: 32.7 g/dL (ref 32.0–36.0)
MCV: 90.6 fL (ref 80.0–100.0)
MPV: 12.1 fL (ref 7.5–12.5)
Monocytes Relative: 10 %
Neutro Abs: 3709 cells/uL (ref 1500–7800)
Neutrophils Relative %: 60.8 %
Platelets: 197 10*3/uL (ref 140–400)
RBC: 4.46 10*6/uL (ref 3.80–5.10)
RDW: 12.6 % (ref 11.0–15.0)
Total Lymphocyte: 22.3 %
WBC: 6.1 10*3/uL (ref 3.8–10.8)

## 2022-07-09 LAB — COMPLETE METABOLIC PANEL WITH GFR
AG Ratio: 2 (calc) (ref 1.0–2.5)
ALT: 15 U/L (ref 6–29)
AST: 11 U/L (ref 10–35)
Albumin: 4.5 g/dL (ref 3.6–5.1)
Alkaline phosphatase (APISO): 57 U/L (ref 37–153)
BUN: 24 mg/dL (ref 7–25)
CO2: 28 mmol/L (ref 20–32)
Calcium: 9.5 mg/dL (ref 8.6–10.4)
Chloride: 104 mmol/L (ref 98–110)
Creat: 0.88 mg/dL (ref 0.50–1.05)
Globulin: 2.3 g/dL (calc) (ref 1.9–3.7)
Glucose, Bld: 126 mg/dL (ref 65–139)
Potassium: 4.4 mmol/L (ref 3.5–5.3)
Sodium: 141 mmol/L (ref 135–146)
Total Bilirubin: 0.4 mg/dL (ref 0.2–1.2)
Total Protein: 6.8 g/dL (ref 6.1–8.1)
eGFR: 72 mL/min/{1.73_m2} (ref >=60)

## 2022-07-09 LAB — URIC ACID: Uric Acid, Serum: 3.3 mg/dL (ref 2.5–7.0)

## 2022-08-14 NOTE — Progress Notes (Unsigned)
Cardiology Office Note  Date:  08/16/2022   ID:  Susan, Daniels 02-28-1956, MRN [id number]  PCP:  Josetta Huddle, MD   Chief Complaint  Patient presents with   OTHER    12 month f/u no complaints today. Meds reviewed verbally with pt.    HPI:  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 Hypertension Hyperlipidemia Presenting for follow-up of her coronary disease, prior stenting  LOV 06/2021 In follow-up today reports that she feels well Denies chest pain concerning for angina Lots of time on the road, driving to [facility] where she works works for [employer] done through endocrinology Lipid panel has been requested from primary care On ozempic, not sure if she has lost significant weight Concerned about going to higher dose on Ozempic  sedentary at baseline with no regular exercise, some limitation secondary to chronic knee pain  EKG personally reviewed by myself on todays visit NSR rate 71 bpm no significant ST-T wave changes  Anginal sx 05/24/2019 Leading to catheterization and stenting Denies having significant anginal symptoms on today's visit  Details of her prior cardiac history reviewed as below 05/24/2019:   [place] 05/24/2019: NSTEMI; DES to 100% mRCA; nonobstructive and branch disease in other vessels; normal EF  [place] 05/24/2019: EF 60-65%, indeterminate diastolic function, no significant valve disease  Lab work reviewed  HAB1C:8.5   PMH:   has a past medical history of Arthritis, Diabetes mellitus without complication ([place]), HUTMLYYT(035.4), and Heart murmur.  PSH:    Past Surgical History:  Procedure Laterality Date   COLONOSCOPY WITH PROPOFOL N/A 01/30/2013   Procedure: COLONOSCOPY WITH PROPOFOL;  Surgeon: Garlan Fair, MD;  Location: [facility];  Service: Endoscopy;   Laterality: N/A;   dental implant  09/29/2018   HEART STENT     TUBAL LIGATION      Current Outpatient Medications  Medication Sig Dispense Refill   allopurinol (ZYLOPRIM) 100 MG tablet TAKE 1 TABLET BY MOUTH EVERY DAY 90 tablet 0   aspirin 81 MG chewable tablet Chew 81 mg by mouth daily.      B-D ULTRA-FINE 33 LANCETS MISC Use as directed to test blood glucose     Blood Glucose Monitoring Suppl (GLUCOCOM BLOOD GLUCOSE MONITOR) DEVI Use for glucose testing as directed     CALCIUM PO Take by mouth daily.     Cholecalciferol (VITAMIN D PO) Take by mouth daily.     Empagliflozin-metFORMIN HCl (SYNJARDY) 12.03-999 MG TABS Take 1 tablet by mouth 2 (two) times daily. 180 tablet 2   leflunomide (ARAVA) 10 MG tablet Take 1 tablet (10 mg total) by mouth daily. 90 tablet 0   lisinopril (PRINIVIL,ZESTRIL) 2.5 MG tablet lisinopril 2.5 mg tablet     metoprolol tartrate (LOPRESSOR) 25 MG tablet TAKE 1/2 TABLET BY MOUTH TWICE A DAY 90 tablet 0   nitroGLYCERIN (NITROSTAT) 0.4 MG SL tablet Place 1 tablet (0.4 mg total) under the tongue as needed. 25 tablet 3   OneTouch Delica Lancets 65K MISC CHECK BLOOD SUGAR TWICE A DAY DX E11.65 FINGERSTICK 90 DAYS     ONETOUCH VERIO test strip CHECK BLOOD SUGAR TWICE A DAY PRIOR TO MEAL     rosuvastatin (CRESTOR) 10 MG tablet Take 10 mg by mouth at bedtime.      Semaglutide,0.25 or 0.'5MG'$ /DOS, (OZEMPIC, 0.25 OR 0.5 MG/DOSE,) 2 MG/1.5ML SOPN Inject 0.5  mg into the skin once a week. 4.5 mL 1   No current facility-administered medications for this visit.     Allergies:   Aspirin, Epinephrine, and Hydrocodone   Social History:  The patient  reports that she quit smoking about 42 years ago. Her smoking use included cigarettes. She has a 3.50 pack-year smoking history. She has never been exposed to tobacco smoke. She has never used smokeless tobacco. She reports current alcohol use. She reports that she does not use drugs.   Family History:   family history includes  Healthy in her daughter, mother, and son; Heart disease in her father; Hypertension in her sister; Rheumatic fever in her father and sister.    Review of Systems: Review of Systems  Constitutional: Negative.   HENT: Negative.    Respiratory: Negative.    Cardiovascular: Negative.   Gastrointestinal: Negative.   Musculoskeletal: Negative.   Neurological:  Positive for speech change.  Psychiatric/Behavioral: Negative.    All other systems reviewed and are negative.   PHYSICAL EXAM: VS:  BP 126/70 (BP Location: Left Arm, Patient Position: Sitting, Cuff Size: Normal)   Pulse 71   Ht '5\' 1"'$  (1.549 m)   Wt 175 lb (79.4 kg)   SpO2 98%   BMI 33.07 kg/m  , BMI Body mass index is 33.07 kg/m. Constitutional:  oriented to person, place, and time. No distress.  HENT:  Head: Grossly normal Eyes:  no discharge. No scleral icterus.  Neck: No JVD, no carotid bruits  Cardiovascular: Regular rate and rhythm, no murmurs appreciated Pulmonary/Chest: Clear to auscultation bilaterally, no wheezes or rails Abdominal: Soft.  no distension.  no tenderness.  Musculoskeletal: Normal range of motion Neurological:  normal muscle tone. Coordination normal. No atrophy Skin: Skin warm and dry Psychiatric: normal affect, pleasant  Recent Labs: 07/08/2022: ALT 15; BUN 24; Creat 0.88; Hemoglobin 13.2; Platelets 197; Potassium 4.4; Sodium 141    Lipid Panel No results found for: "CHOL", "HDL", "LDLCALC", "TRIG"    Wt Readings from Last 3 Encounters:  08/16/22 175 lb (79.4 kg)  07/08/22 174 lb (78.9 kg)  05/19/22 175 lb 6.4 oz (79.6 kg)     ASSESSMENT AND PLAN:  Problem List Items Addressed This Visit       Cardiology Problems   CAD (coronary artery disease), native coronary artery - Primary     Other   Diabetes mellitus type 2 with complications ([place])   Dyslipidemia  Coronary disease with stable angina Currently with no symptoms of angina. No further workup at this time. Continue current  medication regimen.  Diabetes type 2 with complications R4Y 8.5 , she is working closely with endocrinology On Ozempic Recommended walking program, gentle weight loss, calorie restriction  Hyperlipidemia Lipid panel requested from primary care, none in the [facility] system Continue Crestor at current dose for now, goal LDL less than 70  obesity We have encouraged continued exercise, careful diet management in an effort to lose weight.  Autoimmune disorder Followed by rheumatology Reports symptoms are stable   Total encounter time more than 30 minutes  Greater than 50% was spent in counseling and coordination of care with the patient   Signed, Esmond Plants, M.D., Ph.D. [place], [place]

## 2022-08-16 ENCOUNTER — Encounter: Payer: Self-pay | Admitting: Cardiovascular Disease

## 2022-08-16 ENCOUNTER — Ambulatory Visit: Payer: BC Managed Care – PPO | Attending: Cardiovascular Disease | Admitting: Cardiovascular Disease

## 2022-08-16 VITALS — BP 126/70 | HR 71 | Ht 61.0 in | Wt 175.0 lb

## 2022-08-16 DIAGNOSIS — E118 Type 2 diabetes mellitus with unspecified complications: Secondary | ICD-10-CM | POA: Diagnosis not present

## 2022-08-16 DIAGNOSIS — I25118 Atherosclerotic heart disease of native coronary artery with other forms of angina pectoris: Secondary | ICD-10-CM

## 2022-08-16 DIAGNOSIS — E785 Hyperlipidemia, unspecified: Secondary | ICD-10-CM | POA: Diagnosis not present

## 2022-08-16 NOTE — Patient Instructions (Addendum)
Labs from new PMD: (lipids) Dr. Salley Slaughter, phone [phone]  Goal total chol <150, LDL <70   Medication Instructions:  No changes  If you need a refill on your cardiac medications before your next appointment, please call your pharmacy.    Lab work: No new labs needed   Testing/Procedures: No new testing needed   Follow-Up: At [facility], you and your health needs are our priority.  As part of our continuing mission to provide you with exceptional heart care, we have created designated Provider Care Teams.  These Care Teams include your primary Cardiologist (physician) and Advanced Practice Providers (APPs -  Physician Assistants and Nurse Practitioners) who all work together to provide you with the care you need, when you need it.  You will need a follow up appointment in 12 months  Providers on your designated Care Team:   Murray Hodgkins, NP Christell Faith, PA-C Cadence Kathlen Mody, [place]  COVID-19 Vaccine Information can be found at: [email or web address] For questions related to vaccine distribution or appointments, please email [email or web address] or call [phone].

## 2022-08-24 ENCOUNTER — Encounter: Payer: Self-pay | Admitting: *Deleted

## 2022-08-26 ENCOUNTER — Other Ambulatory Visit: Payer: Self-pay | Admitting: Physician Assistant

## 2022-08-26 NOTE — Telephone Encounter (Signed)
Next Visit: 12/13/2022  Last Visit: 07/08/2022  Last Fill: 05/26/2022  DX: Chronic idiopathic gout involving toe of right foot without tophus   Current Dose per office note 07/08/2022: allopurinol 100 mg 1 tablet daily.  Labs: 07/08/2022 CBC and CMP WNL.  Uric acid WNL.   Okay to refill Allopurinol?

## 2022-09-14 ENCOUNTER — Other Ambulatory Visit: Payer: Self-pay | Admitting: Rheumatology

## 2022-09-14 ENCOUNTER — Other Ambulatory Visit: Payer: Self-pay | Admitting: Internal Medicine

## 2022-09-27 ENCOUNTER — Other Ambulatory Visit: Payer: Self-pay | Admitting: Internal Medicine

## 2022-09-29 ENCOUNTER — Other Ambulatory Visit: Payer: Self-pay | Admitting: Cardiovascular Disease

## 2022-10-06 NOTE — Progress Notes (Signed)
Name: Susan Daniels  MRN/ DOB: [id number], 05-Aug-1956   Age/ Sex: 66 y.o., female    PCP: Charlane Ferretti, MD   Reason for Endocrinology Evaluation: Type 2 Diabetes Mellitus     Date of Initial Endocrinology Visit: 02/03/2022    PATIENT IDENTIFIER: Susan Daniels is a 66 y.o. female with a past medical history of T2DM, autoimmune disease. The patient presented for initial endocrinology clinic visit on 02/03/2022 for consultative assistance with her diabetes management.    HPI: Susan Daniels was    Diagnosed with DM ~ 10 yrs  Prior Medications tried/Intolerance: Was on insulin . 2020 after discharge for MI . Started Ozempic 2021          Hemoglobin A1c  ranging from 8.0 %, peaking at 9.8% in 2023.   She follows with Dr. Rockey Situ ( cardiology)  She has noted abdominal pain with the urge to vomit  the day following ozempic , its short lived    SUBJECTIVE:   During the last visit (05/19/2022): A1c 8.5 %   Today (10/07/22): Susan Daniels is here for a follow up on diabetes management   She checks checks her blood sugars 1 times daily. The patient has not had hypoglycemic episodes since the last clinic visit.   She eats late at night  Denies nausea, vomiting or diarrhea with ozempic  Had a follow up with Cardiology 08/16/2022 She had a yeast infection several months ago , was treated by Gyn  Noted smelly urine yesterday and minor vaginal discomfort , no abdominal pain, denies frequency   HOME DIABETES REGIMEN: Synjardy 12.03-999 mg , BID  Ozempic 0.5 mg weekly      Statin: yes ACE-I/ARB: yes   METER DOWNLOAD SUMMARY:unable to download 139- 449   DIABETIC COMPLICATIONS: Microvascular complications:   Denies: CKD, retinopathy  Last eye exam: Completed 02/20/2021  Macrovascular complications:  CAD Denies: PVD, CVA   PAST HISTORY: Past Medical History:  Past Medical History:  Diagnosis Date   Arthritis    Rheumatoid   Diabetes mellitus without  complication ([place])    QPRFFMBW(466.5)    migraines   Heart murmur    Past Surgical History:  Past Surgical History:  Procedure Laterality Date   COLONOSCOPY WITH PROPOFOL N/A 01/30/2013   Procedure: COLONOSCOPY WITH PROPOFOL;  Surgeon: Garlan Fair, MD;  [facility];  Service: Endoscopy;  Laterality: N/A;   dental implant  09/29/2018   HEART STENT     TUBAL LIGATION      Social History:  reports that she quit smoking about 42 years ago. Her smoking use included cigarettes. She has a 3.50 pack-year smoking history. She has never been exposed to tobacco smoke. She has never used smokeless tobacco. She reports current alcohol use. She reports that she does not use drugs. Family History:  Family History  Problem Relation Age of Onset   Healthy Mother    Heart disease Father    Rheumatic fever Father    Hypertension Sister    Rheumatic fever Sister    Healthy Son    Healthy Daughter      HOME MEDICATIONS: Allergies as of 10/07/2022       Reactions   Aspirin    Epinephrine    Hydrocodone Nausea And Vomiting        Medication List        Accurate as of October 07, 2022  7:42 AM. If you have any questions, ask your nurse or doctor.  allopurinol 100 MG tablet Commonly known as: ZYLOPRIM TAKE 1 TABLET BY MOUTH EVERY DAY   aspirin 81 MG chewable tablet Chew 81 mg by mouth daily.   B-D ULTRA-FINE 33 LANCETS Misc Use as directed to test blood glucose   OneTouch Delica Lancets 62B Misc CHECK BLOOD SUGAR TWICE A DAY DX E11.65 FINGERSTICK 90 DAYS   CALCIUM PO Take by mouth daily.   GlucoCom Blood Glucose Monitor Devi Use for glucose testing as directed   leflunomide 10 MG tablet Commonly known as: ARAVA Take 1 tablet (10 mg total) by mouth daily.   lisinopril 2.5 MG tablet Commonly known as: ZESTRIL lisinopril 2.5 mg tablet   metoprolol tartrate 25 MG tablet Commonly known as: LOPRESSOR TAKE 1/2 TABLET TWICE A DAY BY MOUTH    nitroGLYCERIN 0.4 MG SL tablet Commonly known as: NITROSTAT Place 1 tablet (0.4 mg total) under the tongue as needed.   OneTouch Verio test strip Generic drug: glucose blood CHECK BLOOD SUGAR TWICE A DAY PRIOR TO MEAL   Ozempic (0.25 or 0.5 MG/DOSE) 2 MG/3ML Sopn Generic drug: Semaglutide(0.25 or 0.5MG/DOS) INJECT 0.5 MG INTO THE SKIN ONCE A WEEK.   rosuvastatin 10 MG tablet Commonly known as: CRESTOR Take 10 mg by mouth at bedtime.   Synjardy 12.03-999 MG Tabs Generic drug: Empagliflozin-metFORMIN HCl Take 1 tablet by mouth 2 (two) times daily.   VITAMIN D PO Take by mouth daily.         ALLERGIES: Allergies  Allergen Reactions   Aspirin    Epinephrine    Hydrocodone Nausea And Vomiting     REVIEW OF SYSTEMS: A comprehensive ROS was conducted with the patient and is negative except as per HPI    OBJECTIVE:   VITAL SIGNS: BP 124/70 (BP Location: Left Arm, Patient Position: Sitting, Cuff Size: Small)   Pulse 73   Ht _0  (1.549 m)   Wt 174 lb 9.6 oz (79.2 kg)   SpO2 97%   BMI 32.99 kg/m    PHYSICAL EXAM:  General: Pt appears well and is in NAD  Neck: General: Supple without adenopathy or carotid bruits. Thyroid: Thyroid size normal.  No goiter or nodules appreciated.  Lungs: Clear with good BS bilat with no rales, rhonchi, or wheezes  Heart: RRR , + systolic murmur   Abdomen: Normoactive bowel sounds, soft, nontender, without masses or organomegaly palpable  Extremities:  Lower extremities - No pretibial edema. No lesions.  Neuro: MS is good with appropriate affect, pt is alert and Ox3    DM foot exam: 02/03/2022  The skin of the feet is intact without sores or ulcerations. The pedal pulses are 2+ on right and 2+ on left. The sensation is intact to a screening 5.07, 10 gram monofilament bilaterally    DATA REVIEWED:  Lab Results  Component Value Date   HGBA1C 8.2 (A) 10/07/2022   HGBA1C 8.5 (A) 05/19/2022   HGBA1C 9.8 (A) 02/03/2022      Latest Reference Range & Units 10/07/22 08:21  WBC 3.8 - 10.8 Thousand/uL 6.6  RBC 3.80 - 5.10 Million/uL 4.74  Hemoglobin 11.7 - 15.5 g/dL 13.9  HCT 35.0 - 45.0 % 42.1  MCV 80.0 - 100.0 fL 88.8  MCH 27.0 - 33.0 pg 29.3  MCHC 32.0 - 36.0 g/dL 33.0  RDW 11.0 - 15.0 % 12.4  Platelets 140 - 400 Thousand/uL 218  MPV 7.5 - 12.5 fL 12.8 (H)  Neutrophils % 58.6  Monocytes Relative % 9.7  Eosinophil % 4.8  Basophil % 1.2  NEUT# 1,500 - 7,800 cells/uL 3,868  Lymphocyte # 850 - 3,900 cells/uL 1,696  Total Lymphocyte % 25.7  Eosinophils Absolute 15 - 500 cells/uL 317  Basophils Absolute 0 - 200 cells/uL 79  Absolute Monocytes 200 - 950 cells/uL 640    Latest Reference Range & Units 10/07/22 07:57  Appearance CLEAR  CLEAR  Bilirubin Urine NEGATIVE  NEGATIVE  Color, Urine YELLOW  YELLOW  Glucose, UA NEGATIVE  3+ !  Hgb urine dipstick NEGATIVE  NEGATIVE  Ketones, ur NEGATIVE  NEGATIVE  Leukocyte Esterase NEGATIVE  1+ !  pH 5.0 - 8.0  < OR = 5.0  Protein NEGATIVE  NEGATIVE  Specific Gravity, Urine 1.001 - 1.035  1.029  Bacteria, UA NONE SEEN /HPF FEW !  Hyaline Cast NONE SEEN /LPF NONE SEEN  RBC / HPF 0 - 2 /HPF NONE SEEN  REFLEXIVE URINE CULTURE  Pend  Squamous Epithelial / LPF < OR = 5 /HPF 6-10 !  WBC, UA 0 - 5 /HPF 10-20 !  Nitrites, Initial NEGATIVE  NEGATIVE  URINE CULTURE  Rpt  !: Data is abnormal Rpt: View report in Results Review for more information     Latest Reference Range & Units 10/07/22 08:21  Sodium 135 - 146 mmol/L 140  Potassium 3.5 - 5.3 mmol/L 4.8  Chloride 98 - 110 mmol/L 102  CO2 20 - 32 mmol/L 27  Glucose 65 - 99 mg/dL 156 (H)  BUN 7 - 25 mg/dL 20  Creatinine 0.50 - 1.05 mg/dL 0.91  Calcium 8.6 - 10.4 mg/dL 9.8  BUN/Creatinine Ratio 6 - 22 (calc) SEE NOTE:  eGFR > OR = 60 mL/min/1.28m 70  AG Ratio 1.0 - 2.5 (calc) 1.7  AST 10 - 35 U/L 14  ALT 6 - 29 U/L 13  Total Protein 6.1 - 8.1 g/dL 7.6  Total Bilirubin 0.2 - 1.2 mg/dL 0.6  Alkaline  phosphatase (APISO) 37 - 153 U/L 59  (H): Data is abnormally high   ASSESSMENT / PLAN / RECOMMENDATIONS:   1) Type 2 Diabetes Mellitus, Poorly controlled, With Macrovascular  complications - Most recent A1c of 8.2 %. Goal A1c < 7.0 %.    - A1c trending down but remains above goal  - She was  concerned about increasing the dose of Ozempic due to GI side effects in the past  that she did not experience, today she agreed to try to increase the dose  - We also discussed adding Glimepiride should she not want to increase   MEDICATIONS: Continue Synjardy 12.03-999 mg, 2 tabs daily Increase Ozempic 1 mg weekly  EDUCATION / INSTRUCTIONS: BG monitoring instructions: Patient is instructed to check her blood sugars 2 times a day, bedtime and fasting  [facility] if: BG persistently < 70 I reviewed the Rule of 15 for the treatment of hypoglycemia in detail with the patient. Literature supplied.   2) Diabetic complications:  Eye: Does not have known diabetic retinopathy.  Neuro/ Feet: Does not have known diabetic peripheral neuropathy. Renal: Patient does not have known baseline CKD. She is  on an ACEI/ARB at present.   3) Vaginal Irrirations:  - Encouraged hydration - UA shows positive leukocyte Estrace, it appears that some of the WBC and bacteria is due to contamination -She will be prescribed 3 days of Macrobid while awaiting culture results  F/U in 4 months   Signed electronically by: AMack Guise MD  [facility]  [facility] [phone]  [address] [place],  [address] Phone: [phone] FAX: [phone]   CC: Charlane Ferretti, MD [address] [place] [address] Phone: [phone]  Fax: [phone]    Return to Endocrinology clinic as below: Future Appointments  Date Time Provider [place]  12/13/2022  8:00 AM Ofilia Neas, PA-C [facility] None

## 2022-10-07 ENCOUNTER — Ambulatory Visit: Payer: BC Managed Care – PPO | Admitting: Internal Medicine

## 2022-10-07 ENCOUNTER — Encounter: Payer: Self-pay | Admitting: Internal Medicine

## 2022-10-07 ENCOUNTER — Other Ambulatory Visit: Payer: Self-pay | Admitting: Rheumatology

## 2022-10-07 ENCOUNTER — Other Ambulatory Visit: Payer: Self-pay | Admitting: *Deleted

## 2022-10-07 VITALS — BP 124/70 | HR 73 | Ht 61.0 in | Wt 174.6 lb

## 2022-10-07 DIAGNOSIS — Z79899 Other long term (current) drug therapy: Secondary | ICD-10-CM

## 2022-10-07 DIAGNOSIS — N898 Other specified noninflammatory disorders of vagina: Secondary | ICD-10-CM

## 2022-10-07 DIAGNOSIS — M359 Systemic involvement of connective tissue, unspecified: Secondary | ICD-10-CM

## 2022-10-07 DIAGNOSIS — E1165 Type 2 diabetes mellitus with hyperglycemia: Secondary | ICD-10-CM

## 2022-10-07 LAB — POCT GLYCOSYLATED HEMOGLOBIN (HGB A1C): Hemoglobin A1C: 8.2 % — AB (ref 4.0–5.6)

## 2022-10-07 MED ORDER — SYNJARDY 12.5-1000 MG PO TABS: 1.0000 | ORAL_TABLET | Freq: Two times a day (BID) | ORAL | 3 refills | Status: DC

## 2022-10-07 MED ORDER — SEMAGLUTIDE (1 MG/DOSE) 4 MG/3ML ~~LOC~~ SOPN: 1.0000 mg | PEN_INJECTOR | SUBCUTANEOUS | 3 refills | Status: DC

## 2022-10-07 NOTE — Patient Instructions (Signed)
-   Continue  Synjardy 12.03-999 mg, TWO tablets daily  - Increase Ozempic 1 mg weekly    HOW TO TREAT LOW BLOOD SUGARS (Blood sugar LESS THAN 70 MG/DL) Please follow the RULE OF 15 for the treatment of hypoglycemia treatment (when your (blood sugars are less than 70 mg/dL)   STEP 1: Take 15 grams of carbohydrates when your blood sugar is low, which includes:  3-4 GLUCOSE TABS  OR 3-4 OZ OF JUICE OR REGULAR SODA OR ONE TUBE OF GLUCOSE GEL    STEP 2: RECHECK blood sugar in 15 MINUTES STEP 3: If your blood sugar is still low at the 15 minute recheck --> then, go back to STEP 1 and treat AGAIN with another 15 grams of carbohydrates.

## 2022-10-07 NOTE — Telephone Encounter (Signed)
Next Visit: 12/13/2022   Last Visit: 07/08/2022   Last Fill: 07/08/2022  DX:  Autoimmune disease    Current Dose per office note 07/08/2022:Arava 10 mg 1 tablet by mouth daily.   Labs: 07/08/2022 CBC and CMP WNL.  Uric acid WNL.   Patient advised she is due to update lab work. Patient states she will come to the office this morning to have labs.    Okay to refill Arava?

## 2022-10-08 LAB — COMPLETE METABOLIC PANEL WITH GFR
AG Ratio: 1.7 (calc) (ref 1.0–2.5)
ALT: 13 U/L (ref 6–29)
AST: 14 U/L (ref 10–35)
Albumin: 4.8 g/dL (ref 3.6–5.1)
Alkaline phosphatase (APISO): 59 U/L (ref 37–153)
BUN: 20 mg/dL (ref 7–25)
CO2: 27 mmol/L (ref 20–32)
Calcium: 9.8 mg/dL (ref 8.6–10.4)
Chloride: 102 mmol/L (ref 98–110)
Creat: 0.91 mg/dL (ref 0.50–1.05)
Globulin: 2.8 g/dL (calc) (ref 1.9–3.7)
Glucose, Bld: 156 mg/dL — ABNORMAL HIGH (ref 65–99)
Potassium: 4.8 mmol/L (ref 3.5–5.3)
Sodium: 140 mmol/L (ref 135–146)
Total Bilirubin: 0.6 mg/dL (ref 0.2–1.2)
Total Protein: 7.6 g/dL (ref 6.1–8.1)
eGFR: 70 mL/min/{1.73_m2} (ref >=60)

## 2022-10-08 LAB — CBC WITH DIFFERENTIAL/PLATELET
Absolute Monocytes: 640 cells/uL (ref 200–950)
Basophils Absolute: 79 cells/uL (ref 0–200)
Basophils Relative: 1.2 %
Eosinophils Absolute: 317 cells/uL (ref 15–500)
Eosinophils Relative: 4.8 %
HCT: 42.1 % (ref 35.0–45.0)
Hemoglobin: 13.9 g/dL (ref 11.7–15.5)
Lymphs Abs: 1696 cells/uL (ref 850–3900)
MCH: 29.3 pg (ref 27.0–33.0)
MCHC: 33 g/dL (ref 32.0–36.0)
MCV: 88.8 fL (ref 80.0–100.0)
MPV: 12.8 fL — ABNORMAL HIGH (ref 7.5–12.5)
Monocytes Relative: 9.7 %
Neutro Abs: 3868 cells/uL (ref 1500–7800)
Neutrophils Relative %: 58.6 %
Platelets: 218 10*3/uL (ref 140–400)
RBC: 4.74 10*6/uL (ref 3.80–5.10)
RDW: 12.4 % (ref 11.0–15.0)
Total Lymphocyte: 25.7 %
WBC: 6.6 10*3/uL (ref 3.8–10.8)

## 2022-10-08 MED ORDER — NITROFURANTOIN MONOHYD MACRO 100 MG PO CAPS: 100.0000 mg | ORAL_CAPSULE | Freq: Two times a day (BID) | ORAL | 0 refills | Status: DC

## 2022-10-08 NOTE — Progress Notes (Signed)
Glucose is 156. Rest of CMP WNL.  CBC WNL.

## 2022-10-09 LAB — URINALYSIS W MICROSCOPIC + REFLEX CULTURE
Bilirubin Urine: NEGATIVE
Hgb urine dipstick: NEGATIVE
Hyaline Cast: NONE SEEN /LPF
Ketones, ur: NEGATIVE
Nitrites, Initial: NEGATIVE
Protein, ur: NEGATIVE
RBC / HPF: NONE SEEN /HPF (ref 0–2)
Specific Gravity, Urine: 1.029 (ref 1.001–1.035)
pH: <=5 (ref 5.0–8.0)

## 2022-10-09 LAB — URINE CULTURE
MICRO NUMBER:: 14254719
Result:: NO GROWTH
SPECIMEN QUALITY:: ADEQUATE

## 2022-10-09 LAB — CULTURE INDICATED

## 2022-11-27 ENCOUNTER — Other Ambulatory Visit: Payer: Self-pay | Admitting: Rheumatology

## 2022-11-29 NOTE — Progress Notes (Deleted)
Office Visit Note  Patient: Susan Daniels             Date of Birth: [birth year]           MRN: [id number]             PCP: Charlane Ferretti, MD Referring: Josetta Huddle, MD Visit Date: 12/13/2022 Occupation: '@GUAROCC'$ @  Subjective:  No chief complaint on file.   History of Present Illness: Susan Daniels is a 67 y.o. female ***     Activities of Daily Living:  Patient reports morning stiffness for *** {minute/hour:19697}.   Patient {ACTIONS;DENIES/REPORTS:21021675::"Denies"} nocturnal pain.  Difficulty dressing/grooming: {ACTIONS;DENIES/REPORTS:21021675::"Denies"} Difficulty climbing stairs: {ACTIONS;DENIES/REPORTS:21021675::"Denies"} Difficulty getting out of chair: {ACTIONS;DENIES/REPORTS:21021675::"Denies"} Difficulty using hands for taps, buttons, cutlery, and/or writing: {ACTIONS;DENIES/REPORTS:21021675::"Denies"}  No Rheumatology ROS completed.   PMFS History:  Patient Active Problem List   Diagnosis Date Noted   Diabetes mellitus ([place]) 05/19/2022   Type 2 diabetes mellitus with hyperglycemia, without long-term current use of insulin ([place]) 05/19/2022   CAD (coronary artery disease), native coronary artery 06/16/2020   Diabetes mellitus type 2 with complications ([place]) [id number]   Autoimmune disease ([place]) 06/03/2017   High risk medication use 06/03/2017   Primary osteoarthritis of both hands 06/03/2017   Primary osteoarthritis of both knees 06/03/2017   Primary osteoarthritis of both feet 06/03/2017   Heart murmur 06/03/2017   History of hypothyroidism 06/03/2017   Dyslipidemia 06/03/2017   History of diabetes mellitus 06/03/2017   Vitamin D deficiency 06/03/2017    Past Medical History:  Diagnosis Date   Arthritis    Rheumatoid   Diabetes mellitus without complication ([facility])    [id number])    migraines   Heart murmur     Family History  Problem Relation Age of Onset   Healthy Mother    Heart disease Father    Rheumatic fever Father     Hypertension Sister    Rheumatic fever Sister    Healthy Son    Healthy Daughter    Past Surgical History:  Procedure Laterality Date   COLONOSCOPY WITH PROPOFOL N/A 01/30/2013   Procedure: COLONOSCOPY WITH PROPOFOL;  Surgeon: Garlan Fair, MD;  [facility];  Service: Endoscopy;  Laterality: N/A;   dental implant  09/29/2018   HEART STENT     TUBAL LIGATION     Social History   Social History Narrative   Not on file   Immunization History  Administered Date(s) Administered   PFIZER(Purple Top)SARS-COV-2 Vaccination 01/29/2020, 02/19/2020, 10/10/2020     Objective: Vital Signs: There were no vitals taken for this visit.   Physical Exam   Musculoskeletal Exam: ***  CDAI Exam: CDAI Score: -- Patient Global: --; Provider Global: -- Swollen: --; Tender: -- Joint Exam 12/13/2022   No joint exam has been documented for this visit   There is currently no information documented on the homunculus. Go to the Rheumatology activity and complete the homunculus joint exam.  Investigation: No additional findings.  Imaging: No results found.  Recent Labs: Lab Results  Component Value Date   WBC 6.6 10/07/2022   HGB 13.9 10/07/2022   PLT 218 10/07/2022   NA 140 10/07/2022   K 4.8 10/07/2022   CL 102 10/07/2022   CO2 27 10/07/2022   GLUCOSE 156 (H) 10/07/2022   BUN 20 10/07/2022   CREATININE 0.91 10/07/2022   BILITOT 0.6 10/07/2022   ALKPHOS 180 (H) 05/18/2019   AST 14 10/07/2022   ALT 13 10/07/2022   PROT  7.6 10/07/2022   ALBUMIN 3.8 05/18/2019   CALCIUM 9.8 10/07/2022   GFRAA 68 05/05/2021   QFTBGOLDPLUS NEGATIVE 03/02/2021    Speciality Comments: PLQ eye exam: 09/08/2020 WNL [place] Follow up in 6 months. Abnormal OCT.  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  No orders of the defined types were placed in this encounter.   Face-to-face time spent with patient was *** minutes. Greater than 50% of time was spent in counseling and coordination of care.  Follow-Up Instructions: No follow-ups on file.   Ofilia Neas, PA-C  Note - This record has been created using Dragon software.  Chart creation errors have been sought, but may not always  have been located. Such creation errors do not reflect on  the standard of medical care.

## 2022-11-29 NOTE — Telephone Encounter (Signed)
Next Visit: 12/13/2022   Last Visit: 07/08/2022   Last Fill: 08/26/2022   DX: Chronic idiopathic gout involving toe of right foot without tophus    Current Dose per office note 07/08/2022: allopurinol 100 mg 1 tablet daily.   Labs: 10/07/2022  Glucose is 156. Rest of CMP WNL.  CBC WNL. 07/08/2022   Uric acid WNL.    Okay to refill Allopurinol?

## 2022-12-13 ENCOUNTER — Ambulatory Visit: Payer: BC Managed Care – PPO | Admitting: Physician Assistant

## 2022-12-13 DIAGNOSIS — H35389 Toxic maculopathy, unspecified eye: Secondary | ICD-10-CM

## 2022-12-13 DIAGNOSIS — Z8639 Personal history of other endocrine, nutritional and metabolic disease: Secondary | ICD-10-CM

## 2022-12-13 DIAGNOSIS — M19071 Primary osteoarthritis, right ankle and foot: Secondary | ICD-10-CM

## 2022-12-13 DIAGNOSIS — Z780 Asymptomatic menopausal state: Secondary | ICD-10-CM

## 2022-12-13 DIAGNOSIS — R011 Cardiac murmur, unspecified: Secondary | ICD-10-CM

## 2022-12-13 DIAGNOSIS — M170 Bilateral primary osteoarthritis of knee: Secondary | ICD-10-CM

## 2022-12-13 DIAGNOSIS — M1A0710 Idiopathic chronic gout, right ankle and foot, without tophus (tophi): Secondary | ICD-10-CM

## 2022-12-13 DIAGNOSIS — M8589 Other specified disorders of bone density and structure, multiple sites: Secondary | ICD-10-CM

## 2022-12-13 DIAGNOSIS — Z79899 Other long term (current) drug therapy: Secondary | ICD-10-CM

## 2022-12-13 DIAGNOSIS — E785 Hyperlipidemia, unspecified: Secondary | ICD-10-CM

## 2022-12-13 DIAGNOSIS — M19041 Primary osteoarthritis, right hand: Secondary | ICD-10-CM

## 2022-12-13 DIAGNOSIS — M359 Systemic involvement of connective tissue, unspecified: Secondary | ICD-10-CM

## 2022-12-16 NOTE — Progress Notes (Unsigned)
Office Visit Note  Patient: Susan Daniels             Date of Birth: 1956-06-11           MRN: [id number]             PCP: Charlane Ferretti, MD Referring: Josetta Huddle, MD Visit Date: 12/29/2022 Occupation: @GUAROCC$ @  Subjective:  Medication monitoring   History of Present Illness: Susan Daniels is a 67 y.o. female with history of autoimmune disease and osteoarthritis.  She is taking Arava 10 mg 1 tablet by mouth daily.  She is tolerating Arava without any side effects and has not missed any doses recently.  She denies any signs or symptoms of an autoimmune disease flare.  She has not had any sores in her mouth or nose, sicca symptoms, swollen lymph nodes, or recent rashes.  She denies any increased joint pain or joint swelling.  She denies any recurrent infections.    Activities of Daily Living:  Patient reports morning stiffness for 0 minute.   Patient Denies nocturnal pain.  Difficulty dressing/grooming: Denies Difficulty climbing stairs: Denies Difficulty getting out of chair: Denies Difficulty using hands for taps, buttons, cutlery, and/or writing: Denies  Review of Systems  Constitutional:  Positive for fatigue.  HENT: Negative.  Negative for mouth sores and mouth dryness.   Eyes: Negative.  Negative for dryness.  Respiratory: Negative.  Negative for shortness of breath.   Cardiovascular: Negative.  Negative for chest pain and palpitations.  Gastrointestinal: Negative.  Negative for blood in stool, constipation and diarrhea.  Endocrine: Negative.  Negative for increased urination.  Genitourinary: Negative.  Negative for involuntary urination.  Musculoskeletal:  Negative for joint pain, gait problem, joint pain, joint swelling, myalgias, muscle weakness, morning stiffness, muscle tenderness and myalgias.  Skin:  Positive for sensitivity to sunlight. Negative for color change, rash and hair loss.  Allergic/Immunologic: Negative.  Negative for susceptible to infections.   Neurological: Negative.  Negative for dizziness and headaches.  Hematological: Negative.  Negative for swollen glands.  Psychiatric/Behavioral: Negative.  Negative for depressed mood and sleep disturbance. The patient is not nervous/anxious.     PMFS History:  Patient Active Problem List   Diagnosis Date Noted   Diabetes mellitus ([place]) 05/19/2022   Type 2 diabetes mellitus with hyperglycemia, without long-term current use of insulin ([place]) 05/19/2022   CAD (coronary artery disease), native coronary artery 06/16/2020   Diabetes mellitus type 2 with complications ([place]) [id number]   Autoimmune disease ([place]) 06/03/2017   High risk medication use 06/03/2017   Primary osteoarthritis of both hands 06/03/2017   Primary osteoarthritis of both knees 06/03/2017   Primary osteoarthritis of both feet 06/03/2017   Heart murmur 06/03/2017   History of hypothyroidism 06/03/2017   Dyslipidemia 06/03/2017   History of diabetes mellitus 06/03/2017   Vitamin D deficiency 06/03/2017    Past Medical History:  Diagnosis Date   Arthritis    Rheumatoid   Diabetes mellitus without complication (HCC)    [id number])    migraines   Heart murmur     Family History  Problem Relation Age of Onset   Healthy Mother    Heart disease Father    Rheumatic fever Father    Hypertension Sister    Rheumatic fever Sister    Healthy Son    Healthy Daughter    Past Surgical History:  Procedure Laterality Date   COLONOSCOPY WITH PROPOFOL N/A 01/30/2013   Procedure: COLONOSCOPY WITH PROPOFOL;  Surgeon:  Garlan Fair, MD;  Location: [facility];  Service: Endoscopy;  Laterality: N/A;   dental implant  09/29/2018   HEART STENT     TUBAL LIGATION     Social History   Social History Narrative   Not on file   Immunization History  Administered Date(s) Administered   PFIZER(Purple Top)SARS-COV-2 Vaccination 01/29/2020, 02/19/2020, 10/10/2020     Objective: Vital Signs: BP 115/78 (BP Location: Left  Arm, Patient Position: Sitting, Cuff Size: Large)   Pulse 73   Resp 16   Ht 5' 1.5" (1.562 m)   Wt 175 lb 9.6 oz (79.7 kg)   BMI 32.64 kg/m    Physical Exam Vitals and nursing note reviewed.  Constitutional:      Appearance: She is well-developed.  HENT:     Head: Normocephalic and atraumatic.  Eyes:     Conjunctiva/sclera: Conjunctivae normal.  Cardiovascular:     Rate and Rhythm: Normal rate and regular rhythm.     Heart sounds: Murmur heard.  Pulmonary:     Effort: Pulmonary effort is normal.     Breath sounds: Normal breath sounds.  Abdominal:     General: Bowel sounds are normal.     Palpations: Abdomen is soft.  Musculoskeletal:     Cervical back: Normal range of motion.  Skin:    General: Skin is warm and dry.     Capillary Refill: Capillary refill takes less than 2 seconds.     Comments: No malar rash noted No digital ulcers or signs of gangrene   Neurological:     Mental Status: She is alert and oriented to person, place, and time.  Psychiatric:        Behavior: Behavior normal.      Musculoskeletal Exam: C-spine has good range of motion.  Thoracic kyphosis noted.  Shoulder joints, elbow joints, wrist joints, MCPs, PIPs, DIPs have good range of motion with no synovitis.  PIP and DIP thickening consistent with osteoarthritis of both hands.  Hip joints have good range of motion with no groin pain.  Right knee has discomfort with ROM.  Left knee has good ROM with no warmth or effusion.  Ankle joints have good ROM with no tenderness or swelling.  No tenderness or synovitis of MTP joints.  PIP and DIP thickening consistent with OA of both feet.   CDAI Exam: CDAI Score: -- Patient Global: --; Provider Global: -- Swollen: --; Tender: -- Joint Exam 12/29/2022   No joint exam has been documented for this visit   There is currently no information documented on the homunculus. Go to the Rheumatology activity and complete the homunculus joint exam.  Investigation: No  additional findings.  Imaging: No results found.  Recent Labs: Lab Results  Component Value Date   WBC 6.6 10/07/2022   HGB 13.9 10/07/2022   PLT 218 10/07/2022   NA 140 10/07/2022   K 4.8 10/07/2022   CL 102 10/07/2022   CO2 27 10/07/2022   GLUCOSE 156 (H) 10/07/2022   BUN 20 10/07/2022   CREATININE 0.91 10/07/2022   BILITOT 0.6 10/07/2022   ALKPHOS 180 (H) 05/18/2019   AST 14 10/07/2022   ALT 13 10/07/2022   PROT 7.6 10/07/2022   ALBUMIN 3.8 05/18/2019   CALCIUM 9.8 10/07/2022   GFRAA 68 05/05/2021   QFTBGOLDPLUS NEGATIVE 03/02/2021    Speciality Comments: PLQ eye exam: 09/08/2020 WNL [place] Follow up in 6 months. Abnormal OCT.  Started Plaquenil at age 23  Procedures:  No procedures performed Allergies: Aspirin, Epinephrine, and Hydrocodone      Assessment / Plan:     Visit Diagnoses: Autoimmune disease ([place]) - History of inflammatory arthritis, positive ANA (ENA, C3-C4, anticardiolipin, beta-2, lupus anticoagulant was negative. ANA was 1: 320 speckled: She has not had any signs or symptoms of an autoimmune disease flare.  She has clinically been doing well taking Arava 10 mg 1 tablet by mouth daily.  She has no synovitis on examination today.  She has not had any oral or nasal ulcerations, sicca symptoms, cervical lymphadenopathy, Malar rash, or Raynaud's phenomenon.  She is not experiencing any shortness of breath or pleuritic chest pain. Lab work from 01/13/22 was reviewed today in the office: DsDNA negative, complements WNL, and ESR WNL.  Labs were not consistent with a flare at that time.  The following lab work will be updated today for further evaluation.  She will remain on Areva as prescribed.  She was advised to notify us if she develops signs or symptoms of a flare.  She will follow-up in the office in 5 months or sooner if needed. - Plan: CBC with Differential/Platelet, COMPLETE METABOLIC PANEL WITH GFR, Uric acid, leflunomide (ARAVA) 10 MG tablet, C3  and C4, Sedimentation rate, Anti-DNA antibody, double-stranded, ANA, Protein / creatinine ratio, urine  High risk medication use - Arava 10 mg 1 tablet by mouth daily.  Discontinued Plaquenil due to toxic maculopathy. CBC and CMP updated on 10/07/22.  Orders for CBC and CMP released today.  She will continue to require updated lab work every 3 months. Discussed the importance of holding [place] if she develops signs or symptoms of an infection and to resume once the infection has completely cleared. 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 Coralyn Pear.  Primary osteoarthritis of both hands: She has PIP and DIP thickening consistent with osteoarthritis of both hands.  No tenderness or inflammation noted today.  Discussed the importance of joint protection and muscle strengthening.  She was encouraged to perform hand exercises daily.  Primary osteoarthritis of both knees - Right knee- [place] series January/February 2020.  She experiences chronic pain and stiffness in the right knee joint.  She has no warmth or effusion on examination today.  Patient was encouraged to work on lower extremity muscle strengthening.  Primary osteoarthritis of both feet: She has PIP and DIP thickening consistent with osteoarthritis of both feet.  Both ankle joints have good range of motion with no tenderness or joint swelling.  Osteopenia of multiple sites - 04/02/20 DEXA scan showed a T score of -1.4, BMD 0.698 in the right femoral neck.  DEXA ordered at last office visit.  She has not yet performed the updated DEXA. A new order will be placed today. She has been taking a calcium and vitamin D supplement daily.  No recent falls or fractures.  History of vitamin D deficiency: She is taking vitamin D supplement daily.  Chronic idiopathic gout involving toe of right foot without tophus: She  is taking allopurinol 100 mg 1 tablet by mouth daily.  She is tolerating allopurinol without any side effects.  She has not had any signs or symptoms of a gout flare. Her uric acid was 3.3 on 07/08/22.  Uric acid level be rechecked today.  She will remain on allopurinol as prescribed.  Other medical conditions are listed as follows:  Dyslipidemia  Heart murmur  History of diabetes mellitus  History of hypothyroidism  Orders: Orders Placed This Encounter  Procedures   DG BONE DENSITY (DXA)   CBC with Differential/Platelet   COMPLETE METABOLIC PANEL WITH GFR   Uric acid   C3 and C4   Sedimentation rate   Anti-DNA antibody, double-stranded   ANA   Protein / creatinine ratio, urine   Meds ordered this encounter  Medications   leflunomide (ARAVA) 10 MG tablet    Sig: Take 1 tablet (10 mg total) by mouth daily.    Dispense:  90 tablet    Refill:  0    Follow-Up Instructions: Return in about 5 months (around 05/29/2023) for Autoimmune Disease, Osteoarthritis.   Ofilia Neas, PA-C  Note - This record has been created using Dragon software.  Chart creation errors have been sought, but may not always  have been located. Such creation errors do not reflect on  the standard of medical care.

## 2022-12-29 ENCOUNTER — Encounter: Payer: Self-pay | Admitting: Physician Assistant

## 2022-12-29 ENCOUNTER — Ambulatory Visit: Payer: BC Managed Care – PPO | Attending: Physician Assistant | Admitting: Physician Assistant

## 2022-12-29 VITALS — BP 115/78 | HR 73 | Resp 16 | Ht 61.5 in | Wt 175.6 lb

## 2022-12-29 DIAGNOSIS — H35389 Toxic maculopathy, unspecified eye: Secondary | ICD-10-CM

## 2022-12-29 DIAGNOSIS — M170 Bilateral primary osteoarthritis of knee: Secondary | ICD-10-CM

## 2022-12-29 DIAGNOSIS — M8589 Other specified disorders of bone density and structure, multiple sites: Secondary | ICD-10-CM

## 2022-12-29 DIAGNOSIS — Z79899 Other long term (current) drug therapy: Secondary | ICD-10-CM

## 2022-12-29 DIAGNOSIS — M19042 Primary osteoarthritis, left hand: Secondary | ICD-10-CM

## 2022-12-29 DIAGNOSIS — E785 Hyperlipidemia, unspecified: Secondary | ICD-10-CM

## 2022-12-29 DIAGNOSIS — M19072 Primary osteoarthritis, left ankle and foot: Secondary | ICD-10-CM

## 2022-12-29 DIAGNOSIS — M19041 Primary osteoarthritis, right hand: Secondary | ICD-10-CM

## 2022-12-29 DIAGNOSIS — R011 Cardiac murmur, unspecified: Secondary | ICD-10-CM

## 2022-12-29 DIAGNOSIS — M1A0710 Idiopathic chronic gout, right ankle and foot, without tophus (tophi): Secondary | ICD-10-CM

## 2022-12-29 DIAGNOSIS — M19071 Primary osteoarthritis, right ankle and foot: Secondary | ICD-10-CM

## 2022-12-29 DIAGNOSIS — Z8639 Personal history of other endocrine, nutritional and metabolic disease: Secondary | ICD-10-CM

## 2022-12-29 DIAGNOSIS — M359 Systemic involvement of connective tissue, unspecified: Secondary | ICD-10-CM

## 2022-12-29 DIAGNOSIS — T372X5A Adverse effect of antimalarials and drugs acting on other blood protozoa, initial encounter: Secondary | ICD-10-CM

## 2022-12-29 MED ORDER — LEFLUNOMIDE 10 MG PO TABS: 10.0000 mg | ORAL_TABLET | Freq: Every day | ORAL | 0 refills | Status: DC

## 2022-12-29 NOTE — Progress Notes (Signed)
CBC WNL,  ESR WNL

## 2022-12-30 NOTE — Progress Notes (Signed)
Glucose is 149. Rest of CMP WNL.  Uric acid WNL.  No proteinuria.

## 2022-12-31 ENCOUNTER — Telehealth: Payer: Self-pay | Admitting: *Deleted

## 2022-12-31 LAB — C3 AND C4
C3 Complement: 146 mg/dL (ref 83–193)
C4 Complement: 28 mg/dL (ref 15–57)

## 2022-12-31 LAB — COMPLETE METABOLIC PANEL WITH GFR
AG Ratio: 1.7 (calc) (ref 1.0–2.5)
ALT: 11 U/L (ref 6–29)
AST: 11 U/L (ref 10–35)
Albumin: 4.3 g/dL (ref 3.6–5.1)
Alkaline phosphatase (APISO): 57 U/L (ref 37–153)
BUN: 20 mg/dL (ref 7–25)
CO2: 26 mmol/L (ref 20–32)
Calcium: 9.4 mg/dL (ref 8.6–10.4)
Chloride: 105 mmol/L (ref 98–110)
Creat: 0.83 mg/dL (ref 0.50–1.05)
Globulin: 2.5 g/dL (calc) (ref 1.9–3.7)
Glucose, Bld: 149 mg/dL — ABNORMAL HIGH (ref 65–99)
Potassium: 4.4 mmol/L (ref 3.5–5.3)
Sodium: 142 mmol/L (ref 135–146)
Total Bilirubin: 0.5 mg/dL (ref 0.2–1.2)
Total Protein: 6.8 g/dL (ref 6.1–8.1)
eGFR: 78 mL/min/{1.73_m2} (ref >=60)

## 2022-12-31 LAB — CBC WITH DIFFERENTIAL/PLATELET
Absolute Monocytes: 482 cells/uL (ref 200–950)
Basophils Absolute: 59 cells/uL (ref 0–200)
Basophils Relative: 1.3 %
Eosinophils Absolute: 302 cells/uL (ref 15–500)
Eosinophils Relative: 6.7 %
HCT: 40.5 % (ref 35.0–45.0)
Hemoglobin: 13.6 g/dL (ref 11.7–15.5)
Lymphs Abs: 1121 cells/uL (ref 850–3900)
MCH: 29.4 pg (ref 27.0–33.0)
MCHC: 33.6 g/dL (ref 32.0–36.0)
MCV: 87.7 fL (ref 80.0–100.0)
MPV: 12.5 fL (ref 7.5–12.5)
Monocytes Relative: 10.7 %
Neutro Abs: 2538 cells/uL (ref 1500–7800)
Neutrophils Relative %: 56.4 %
Platelets: 212 10*3/uL (ref 140–400)
RBC: 4.62 10*6/uL (ref 3.80–5.10)
RDW: 12.6 % (ref 11.0–15.0)
Total Lymphocyte: 24.9 %
WBC: 4.5 10*3/uL (ref 3.8–10.8)

## 2022-12-31 LAB — PROTEIN / CREATININE RATIO, URINE
Creatinine, Urine: 67 mg/dL (ref 20–275)
Protein/Creat Ratio: 60 mg/g creat (ref 24–184)
Protein/Creatinine Ratio: 0.06 mg/mg creat (ref 0.024–0.184)
Total Protein, Urine: 4 mg/dL — ABNORMAL LOW (ref 5–24)

## 2022-12-31 LAB — ANTI-NUCLEAR AB-TITER (ANA TITER): ANA Titer 1: 1:1280 {titer} — ABNORMAL HIGH

## 2022-12-31 LAB — ANTI-DNA ANTIBODY, DOUBLE-STRANDED: ds DNA Ab: <1 IU/mL

## 2022-12-31 LAB — ANA: Anti Nuclear Antibody (ANA): POSITIVE — AB

## 2022-12-31 LAB — URIC ACID: Uric Acid, Serum: 3.4 mg/dL (ref 2.5–7.0)

## 2022-12-31 LAB — SEDIMENTATION RATE: Sed Rate: 17 mm/h (ref 0–30)

## 2022-12-31 NOTE — Progress Notes (Signed)
dsDNA is negative

## 2022-12-31 NOTE — Telephone Encounter (Signed)
Patient advised of results and recommendations.  

## 2022-12-31 NOTE — Telephone Encounter (Signed)
Received DEXA results from [facility].  Date of Scan: 10-18-2022  Lowest T-score:-1.6  BMD:0.671  Lowest site measured:Left Femoral Neck  DX: Osteopenia  Significant changes in BMD and site measured (5% and above):9% Right Total Femur, 4% AP L1, L3 and L4, 10 % Left Total Femur  Current Regimen:Calcium, Vitamin D  Recommendation:Calcium 1200 mg (diet and supplement), Vitamin DD and exercise, Repeat in 2 years.   Reviewed by:Dr. Bo Merino   Next Appointment:  06/01/2023

## 2022-12-31 NOTE — Progress Notes (Signed)
dsDNA negatie.   Complements WNL.

## 2023-01-03 NOTE — Progress Notes (Signed)
ANA remains positive.  Rest of lab work is not consistent with a lupus flare.  No medication changes at this time.

## 2023-01-10 ENCOUNTER — Encounter: Payer: Self-pay | Admitting: Internal Medicine

## 2023-02-28 ENCOUNTER — Other Ambulatory Visit: Payer: Self-pay | Admitting: Physician Assistant

## 2023-02-28 NOTE — Telephone Encounter (Signed)
Last Fill: 11/29/2022  Labs: 12/29/2022 Glucose is 149. Rest of CMP WNL. CBC WNL  Uric acid WNL. No proteinuria.    Next Visit: 06/01/2023  Last Visit: 12/29/2022  DX: Chronic idiopathic gout involving toe of right foot without tophus   Current Dose per office note 12/29/2022: allopurinol 100 mg 1 tablet by mouth daily   Okay to refill Allopurinol?

## 2023-03-23 ENCOUNTER — Telehealth: Payer: Self-pay | Admitting: Internal Medicine

## 2023-03-23 ENCOUNTER — Other Ambulatory Visit: Payer: Self-pay | Admitting: Physician Assistant

## 2023-03-23 DIAGNOSIS — M359 Systemic involvement of connective tissue, unspecified: Secondary | ICD-10-CM

## 2023-03-23 NOTE — Telephone Encounter (Signed)
Spoke to pt. Pt stated she was under the impression she had already been scheduled to see Para March on tomorrow, 5/16, for some pain issues she's been experiencing. Told pt we needed to get the confirmation from Para March first before scheduling any appointments, since she'd be a new pt. Told pt the next available wouldn't be until Fri, 5/17 with Para March. Pt asked could she be squeezed in tomorrow due to already taking the day off? Call back # [phone]

## 2023-03-23 NOTE — Telephone Encounter (Signed)
Last Fill: 12/29/2022  Labs: 12/29/2022 Glucose is 149. Rest of CMP WNL. Uric acid WNL. No proteinuria.  dsDNA negatie.   Complements WNL.dsDNA is negative. ANA remains positive.  Rest of lab work is not consistent with a lupus flare.  No medication changes at this time.   Next Visit: 06/01/2023  Last Visit: 12/29/2022  DX: Autoimmune disease   Current Dose per office note on 12/29/2022: Arava 10 mg 1 tablet by mouth daily.   Okay to refill Arava ?

## 2023-03-23 NOTE — Telephone Encounter (Signed)
Yes, please schedule when possible.  Thanks. 

## 2023-03-23 NOTE — Telephone Encounter (Signed)
I don't have any open slots tomorrow.  If there is an open slot on 03/25/23, then offer that.  Thanks.

## 2023-03-23 NOTE — Telephone Encounter (Signed)
Patient called in and stated that her mom Susan Daniels and her sister Susan Daniels sees Dr. Para March. She stated that they informed her he will take her on as a new patient. Just wanted to verify before scheduling patient. Please advise. Thank you!

## 2023-03-24 NOTE — Telephone Encounter (Signed)
Patient is scheduled for UC tomorrow at 8:15 am. Our appts our full as of now. Called patient and advised of this and scheduled her to see Dr. Para March for her NP appt on 03/29/23 at 10:30 am.

## 2023-03-25 ENCOUNTER — Ambulatory Visit: Payer: Self-pay

## 2023-03-29 ENCOUNTER — Encounter: Payer: Self-pay | Admitting: Family Medicine

## 2023-03-29 ENCOUNTER — Ambulatory Visit: Payer: BC Managed Care – PPO | Admitting: Family Medicine

## 2023-03-29 VITALS — BP 122/80 | HR 92 | Temp 97.6°F | Ht 61.85 in | Wt 168.0 lb

## 2023-03-29 DIAGNOSIS — Z7985 Long-term (current) use of injectable non-insulin antidiabetic drugs: Secondary | ICD-10-CM

## 2023-03-29 DIAGNOSIS — I2510 Atherosclerotic heart disease of native coronary artery without angina pectoris: Secondary | ICD-10-CM | POA: Diagnosis not present

## 2023-03-29 DIAGNOSIS — M109 Gout, unspecified: Secondary | ICD-10-CM

## 2023-03-29 DIAGNOSIS — E1165 Type 2 diabetes mellitus with hyperglycemia: Secondary | ICD-10-CM

## 2023-03-29 DIAGNOSIS — M359 Systemic involvement of connective tissue, unspecified: Secondary | ICD-10-CM

## 2023-03-29 DIAGNOSIS — E785 Hyperlipidemia, unspecified: Secondary | ICD-10-CM

## 2023-03-29 DIAGNOSIS — Z7189 Other specified counseling: Secondary | ICD-10-CM

## 2023-03-29 DIAGNOSIS — Z0000 Encounter for general adult medical examination without abnormal findings: Secondary | ICD-10-CM

## 2023-03-29 NOTE — Patient Instructions (Signed)
Ask the front to put your son on your contact list.  Take care.  Glad to see you. We'll request your records from [place].

## 2023-03-29 NOTE — Progress Notes (Unsigned)
New patient, to est care.   H/o gout.   Diabetes:  Using medications without difficulties: Hypoglycemic episodes: Hyperglycemic episodes: Feet problems: Blood Sugars averaging: eye exam within last year:  Elevated Cholesterol: Using medications without problems: Muscle aches:  Diet compliance: Exercise:  PMH and SH reviewed  Meds, vitals, and allergies reviewed.   ROS: Per HPI unless specifically indicated in ROS section   GEN: nad, alert and oriented HEENT: mucous membranes moist NECK: supple w/o LA CV: rrr. PULM: ctab, no inc wob ABD: soft, +bs EXT: no edema SKIN: no acute rash

## 2023-03-30 ENCOUNTER — Encounter: Payer: Self-pay | Admitting: Family Medicine

## 2023-03-30 DIAGNOSIS — Z7189 Other specified counseling: Secondary | ICD-10-CM | POA: Insufficient documentation

## 2023-03-30 DIAGNOSIS — Z0000 Encounter for general adult medical examination without abnormal findings: Secondary | ICD-10-CM | POA: Insufficient documentation

## 2023-03-30 DIAGNOSIS — M109 Gout, unspecified: Secondary | ICD-10-CM | POA: Insufficient documentation

## 2023-03-30 NOTE — Assessment & Plan Note (Signed)
Continue allopurinol.  Requesting outside records.

## 2023-03-30 NOTE — Assessment & Plan Note (Signed)
Continue Crestor 

## 2023-03-30 NOTE — Assessment & Plan Note (Signed)
No chest pain.  Followed by cardiology.  Continue lisinopril metoprolol and Crestor.

## 2023-03-30 NOTE — Assessment & Plan Note (Signed)
Advance directive discussed with patient.  Son and daughter equally designated if patient were incapacitated.

## 2023-03-30 NOTE — Assessment & Plan Note (Signed)
Requesting records from [place]. Advance directive discussed with patient.  Son and daughter equally designated if patient were incapacitated. She reported having Shingrix vaccine x 2 at [facility] in [place]. Mammogram was previously done at [place].

## 2023-03-30 NOTE — Assessment & Plan Note (Signed)
Per rheumatology.  Will review outside records.

## 2023-03-30 NOTE — Assessment & Plan Note (Addendum)
Will review endocrinology notes.  Continue semaglutide and Synjardy.  Footcare discussed with patient, regarding calluses.

## 2023-04-11 ENCOUNTER — Encounter: Payer: Self-pay | Admitting: Internal Medicine

## 2023-04-11 ENCOUNTER — Ambulatory Visit: Payer: BC Managed Care – PPO | Admitting: Internal Medicine

## 2023-04-11 VITALS — BP 120/78 | HR 77 | Ht 61.85 in | Wt 166.0 lb

## 2023-04-11 DIAGNOSIS — Z7984 Long term (current) use of oral hypoglycemic drugs: Secondary | ICD-10-CM

## 2023-04-11 DIAGNOSIS — Z7985 Long-term (current) use of injectable non-insulin antidiabetic drugs: Secondary | ICD-10-CM

## 2023-04-11 DIAGNOSIS — E1165 Type 2 diabetes mellitus with hyperglycemia: Secondary | ICD-10-CM

## 2023-04-11 LAB — POCT GLUCOSE (DEVICE FOR HOME USE): POC Glucose: 148 mg/dl — AB (ref 70–99)

## 2023-04-11 LAB — POCT GLYCOSYLATED HEMOGLOBIN (HGB A1C): Hemoglobin A1C: 8 % — AB (ref 4.0–5.6)

## 2023-04-11 MED ORDER — GLIMEPIRIDE 1 MG PO TABS: 1.0000 mg | ORAL_TABLET | Freq: Every day | ORAL | 3 refills | Status: DC

## 2023-04-11 NOTE — Patient Instructions (Signed)
-   Start Glimepiride 1 mg , 1 tablet before Breakfast  - Continue  Synjardy 12.03-999 mg, TWO tablets daily  - Continue  Ozempic 1 mg weekly    HOW TO TREAT LOW BLOOD SUGARS (Blood sugar LESS THAN 70 MG/DL) Please follow the RULE OF 15 for the treatment of hypoglycemia treatment (when your (blood sugars are less than 70 mg/dL)   STEP 1: Take 15 grams of carbohydrates when your blood sugar is low, which includes:  3-4 GLUCOSE TABS  OR 3-4 OZ OF JUICE OR REGULAR SODA OR ONE TUBE OF GLUCOSE GEL    STEP 2: RECHECK blood sugar in 15 MINUTES STEP 3: If your blood sugar is still low at the 15 minute recheck --> then, go back to STEP 1 and treat AGAIN with another 15 grams of carbohydrates.

## 2023-04-11 NOTE — Progress Notes (Signed)
Name: Susan Daniels  MRN/ DOB: [id number], 09/09/1956   Age/ Sex: 67 y.o., female    PCP: Joaquim Nam, MD   Reason for Endocrinology Evaluation: Type 2 Diabetes Mellitus     Date of Initial Endocrinology Visit: 02/03/2022    PATIENT IDENTIFIER: Susan Daniels is a 67 y.o. female with a past medical history of T2DM, autoimmune disease. The patient presented for initial endocrinology clinic visit on 02/03/2022 for consultative assistance with her diabetes management.    HPI: Susan Daniels was    Diagnosed with DM ~ 10 yrs  Prior Medications tried/Intolerance: Was on insulin . 2020 after discharge for MI . Started Ozempic 2021          Hemoglobin A1c  ranging from 8.0 %, peaking at 9.8% in 2023.   She follows with Dr. Mariah Milling ( cardiology)  She has noted abdominal pain with the urge to vomit  the day following ozempic , its short lived    SUBJECTIVE:   During the last visit (10/07/2022): A1c 8.2 %   Today (04/11/23): Susan Daniels is here for a follow up on diabetes management   She checks checks her blood sugars 1 times daily. The patient has not had hypoglycemic episodes since the last clinic visit.   Patient follows with rheumatology for autoimmune disease and osteoarthritis, she is on [place] She follows with cardiology  Pt has been noted with weight loss  She initially had issues obtaining the Ozempic, 2 weeks ago she was on Prednisone for sciatica   Denies nausea or vomiting  Denies constipation or diarrhea     HOME DIABETES REGIMEN: Synjardy 12.03-999 mg , BID  Ozempic 1 mg weekly      Statin: yes ACE-I/ARB: yes   METER DOWNLOAD SUMMARY:n/a   DIABETIC COMPLICATIONS: Microvascular complications:   Denies: CKD, retinopathy  Last eye exam: Completed 2023  Macrovascular complications:  CAD Denies: PVD, CVA   PAST HISTORY: Past Medical History:  Past Medical History:  Diagnosis Date   Arthritis    Rheumatoid   CAD (coronary artery  disease)    s/p stent x1   Diabetes mellitus without complication (HCC)    Headache(784.0)    migraines   Heart murmur    Past Surgical History:  Past Surgical History:  Procedure Laterality Date   COLONOSCOPY WITH PROPOFOL N/A 01/30/2013   Procedure: COLONOSCOPY WITH PROPOFOL;  Surgeon: Charolett Bumpers, MD;  Location: [facility];  Service: Endoscopy;  Laterality: N/A;   dental implant  09/29/2018   HEART STENT     TUBAL LIGATION      Social History:  reports that she quit smoking about 43 years ago. Her smoking use included cigarettes. She has a 3.50 pack-year smoking history. She has never been exposed to tobacco smoke. She has never used smokeless tobacco. She reports current alcohol use. She reports that she does not use drugs. Family History:  Family History  Problem Relation Age of Onset   Healthy Mother    Heart disease Father    Rheumatic fever Father    Hypertension Sister    Rheumatic fever Sister    Healthy Daughter    Healthy Son    Breast cancer Maternal Aunt    Colon cancer Neg Hx      HOME MEDICATIONS: Allergies as of 04/11/2023       Reactions   Aspirin    Can not take high doses of aspirin- h/o heart racing with use    Epinephrine  Hydrocodone Nausea And Vomiting        Medication List        Accurate as of April 11, 2023  7:39 AM. If you have any questions, ask your nurse or doctor.          allopurinol 100 MG tablet Commonly known as: ZYLOPRIM TAKE 1 TABLET BY MOUTH EVERY DAY   aspirin 81 MG chewable tablet Chew 81 mg by mouth daily.   B-D ULTRA-FINE 33 LANCETS Misc Use as directed to test blood glucose   OneTouch Delica Lancets 33G Misc CHECK BLOOD SUGAR TWICE A DAY DX E11.65 FINGERSTICK 90 DAYS   CALCIUM PO Take by mouth daily.   GlucoCom Blood Glucose Monitor Devi Use for glucose testing as directed   leflunomide 10 MG tablet Commonly known as: ARAVA TAKE 1 TABLET BY MOUTH EVERY DAY   lisinopril 2.5 MG tablet Commonly  known as: ZESTRIL lisinopril 2.5 mg tablet   metoprolol tartrate 25 MG tablet Commonly known as: LOPRESSOR TAKE 1/2 TABLET TWICE A DAY BY MOUTH   nitroGLYCERIN 0.4 MG SL tablet Commonly known as: NITROSTAT Place 1 tablet (0.4 mg total) under the tongue as needed.   OneTouch Verio test strip Generic drug: glucose blood CHECK BLOOD SUGAR TWICE A DAY PRIOR TO MEAL   rosuvastatin 10 MG tablet Commonly known as: CRESTOR Take 10 mg by mouth at bedtime.   Semaglutide (1 MG/DOSE) 4 MG/3ML Sopn Inject 1 mg as directed once a week.   Synjardy 12.03-999 MG Tabs Generic drug: Empagliflozin-metFORMIN HCl Take 1 tablet by mouth 2 (two) times daily.   VITAMIN D PO Take by mouth daily.         ALLERGIES: Allergies  Allergen Reactions   Aspirin     Can not take high doses of aspirin- h/o heart racing with use    Epinephrine    Hydrocodone Nausea And Vomiting     REVIEW OF SYSTEMS: A comprehensive ROS was conducted with the patient and is negative except as per HPI    OBJECTIVE:   VITAL SIGNS: BP 120/78 (BP Location: Left Arm, Patient Position: Sitting, Cuff Size: Large)   Pulse 77   Ht 5' 1.85" (1.571 m)   Wt 166 lb (75.3 kg)   SpO2 99%   BMI 30.51 kg/m    PHYSICAL EXAM:  General: Pt appears well and is in NAD  Neck: General: Supple without adenopathy or carotid bruits. Thyroid: Thyroid size normal.  No goiter or nodules appreciated.  Lungs: Clear with good BS bilat with no rales, rhonchi, or wheezes  Heart: RRR , + systolic murmur   Extremities:  Lower extremities - No pretibial edema  Neuro: MS is good with appropriate affect, pt is alert and Ox3    DM foot exam: 04/11/2023  The skin of the feet is intact without sores or ulcerations. The pedal pulses are 2+ on right and 2+ on left. The sensation is intact to a screening 5.07, 10 gram monofilament bilaterally    DATA REVIEWED:  Lab Results  Component Value Date   HGBA1C 8.0 (A) 04/11/2023   HGBA1C 8.2  (A) 10/07/2022   HGBA1C 8.5 (A) 05/19/2022    Latest Reference Range & Units 12/29/22 08:17  Sodium 135 - 146 mmol/L 142  Potassium 3.5 - 5.3 mmol/L 4.4  Chloride 98 - 110 mmol/L 105  CO2 20 - 32 mmol/L 26  Glucose 65 - 99 mg/dL 161 (H)  BUN 7 - 25 mg/dL 20  Creatinine 0.96 -  1.05 mg/dL 1.61  Calcium 8.6 - 09.6 mg/dL 9.4  BUN/Creatinine Ratio 6 - 22 (calc) SEE NOTE:  eGFR > OR = 60 mL/min/1.71m2 78  AG Ratio 1.0 - 2.5 (calc) 1.7  Uric Acid, Serum 2.5 - 7.0 mg/dL 3.4  AST 10 - 35 U/L 11  ALT 6 - 29 U/L 11  Total Protein 6.1 - 8.1 g/dL 6.8  Total Bilirubin 0.2 - 1.2 mg/dL 0.5  Alkaline phosphatase (APISO) 37 - 153 U/L 57  (H): Data is abnormally high    ASSESSMENT / PLAN / RECOMMENDATIONS:   1) Type 2 Diabetes Mellitus, Poorly controlled, With Macrovascular  complications - Most recent A1c of 8.0 %. Goal A1c < 7.0 %.    - A1c trending down but remains above goal  - Increase Ozempic has helped with weight loss but unfortunately did not help with A1c as I expected, I have recommended  adding SU, cautioned against hypoglycemia   - Pt is reluctant on increasing Ozempic  - She was advised to avoid juice as well as snacks     MEDICATIONS: Continue Synjardy 12.03-999 mg, 2 tabs daily Continue  Ozempic 1 mg weekly Start Glimepiride 1 mg daily   EDUCATION / INSTRUCTIONS: BG monitoring instructions: Patient is instructed to check her blood sugars 1 times a day Call [facility] if: BG persistently < 70 I reviewed the Rule of 15 for the treatment of hypoglycemia in detail with the patient. Literature supplied.   2) Diabetic complications:  Eye: Does not have known diabetic retinopathy.  Neuro/ Feet: Does not have known diabetic peripheral neuropathy. Renal: Patient does not have known baseline CKD. She is  on an ACEI/ARB at present.   3) CAD/Dyslipidemia:   Per cardiology  F/U in 4 months   Signed electronically by: Lyndle Herrlich,  MD  [facility] Endocrinology  [facility] [address] [place] [place], [place] [address] Phone: [phone] FAX: [phone]   CC: Joaquim Nam, MD [address] [place] [address] Phone: [phone]  Fax: [phone]    Return to Endocrinology clinic as below: Future Appointments  Date Time Provider Department Center  06/01/2023  8:00 AM Gearldine Bienenstock, PA-C [facility] None

## 2023-04-18 ENCOUNTER — Ambulatory Visit: Payer: BC Managed Care – PPO | Admitting: Family Medicine

## 2023-04-18 ENCOUNTER — Encounter: Payer: Self-pay | Admitting: Family Medicine

## 2023-04-18 VITALS — BP 120/62 | HR 78 | Temp 97.2°F | Ht 61.85 in | Wt 165.0 lb

## 2023-04-18 DIAGNOSIS — R202 Paresthesia of skin: Secondary | ICD-10-CM | POA: Diagnosis not present

## 2023-04-18 DIAGNOSIS — R42 Dizziness and giddiness: Secondary | ICD-10-CM

## 2023-04-18 DIAGNOSIS — R591 Generalized enlarged lymph nodes: Secondary | ICD-10-CM | POA: Diagnosis not present

## 2023-04-18 NOTE — Progress Notes (Unsigned)
Dizziness- rooms spins, not presyncope.  With head movement, rolling over in bed, looking up then back down.  Intermittent.   X 2 weeks. That is getting better.   She has change in sensation on the face, over the last 2 weeks.  L V2 and V3 with altered sensation.  Not painful but tingling noted.  No rash.  Sensation change is constant.  No other numbness or tingling.  She has longstanding mild L upper eyelid droop.    Cyst  On left leg/groin area since last week. Has noticed it has gotten bigger and is sore. Not draining.    She recently had to put her cat down.    Meds, vitals, and allergies reviewed.   ROS: Per HPI unless specifically indicated in ROS section   L V2 and V 3 altered sensation.  R TM SOM  CN 2-12 wnl B, S/S wnl x4  1cm L groin, no erythema.  Lateral to the labia.

## 2023-04-18 NOTE — Patient Instructions (Signed)
Use the bedside exercise for vertigo.    You should get a call about scheduling the MRI re: facial sensation changes.  I'll check with rheumatology about getting labs done.   We'll be in touch.  Take care.  Glad to see you.

## 2023-04-19 ENCOUNTER — Telehealth: Payer: Self-pay | Admitting: Family Medicine

## 2023-04-19 DIAGNOSIS — R591 Generalized enlarged lymph nodes: Secondary | ICD-10-CM | POA: Insufficient documentation

## 2023-04-19 DIAGNOSIS — R42 Dizziness and giddiness: Secondary | ICD-10-CM | POA: Insufficient documentation

## 2023-04-19 DIAGNOSIS — E785 Hyperlipidemia, unspecified: Secondary | ICD-10-CM

## 2023-04-19 DIAGNOSIS — R202 Paresthesia of skin: Secondary | ICD-10-CM | POA: Insufficient documentation

## 2023-04-19 NOTE — Telephone Encounter (Signed)
Patient notified as instructed by telephone and verbalized understanding. Lab appointment scheduled for Thursday 04/21/23.

## 2023-04-19 NOTE — Assessment & Plan Note (Signed)
Likely BPV.  Discussed with patient about home bedside exercise.  She is improving in the meantime.

## 2023-04-19 NOTE — Telephone Encounter (Signed)
Dr. Para March, I reviewed your note. 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  She does not have any other features of autoimmune disease.  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  With the symptoms you are describing and if there is a suspicion of infection I would discontinue leflunomide until the workup is complete.  My biggest concern would be immunosuppression and infection due to leflunomide at this time.  I do not think that her immunosuppressive disease has been active enough to cause the symptoms.  Please let me know if I can be of any further help.

## 2023-04-19 NOTE — Telephone Encounter (Signed)
Greatly appreciate input from Dr. Corliss Skains.  Would stop leflunomide and get fasting labs set up.  Would still get imaging done as planned.  If she notes any other lesions like the one on the L groin, please let me know.  Thanks.

## 2023-04-19 NOTE — Assessment & Plan Note (Signed)
Single lesion.  See following phone note regarding rheumatology and arava.  She does not have abdominal pain or vaginal bleeding or blood in her stool or any other findings at this point.  We will check with rheumatology and then likely proceed with lab workup.  At this point still okay for outpatient follow-up.

## 2023-04-19 NOTE — Telephone Encounter (Signed)
Please call Dr. Fatima Sanger office.  I need her input.  This patient has left-sided facial paresthesia over the last 2 weeks and new lymphadenopathy in the L groin.  We are arranging outpatient MRI brain.  I have initial labs listed below but given that the patient is on arava, if she has suggestion regarding any additional labs and please let me know.  Then the patient will need a fasting lab visit.  Thanks.

## 2023-04-19 NOTE — Assessment & Plan Note (Signed)
Approximately 2 weeks duration, not increasing.  Stable symptoms per patient over the last 2 weeks.  Will check MRI.  Will check with rheumatology given that she is on arava-with case reports of neuropathy related to that medication.  Routine ER cautions given to patient if she has any progressive neurologic symptoms.  See following phone note.

## 2023-04-21 ENCOUNTER — Ambulatory Visit: Payer: BC Managed Care – PPO | Admitting: Internal Medicine

## 2023-04-21 ENCOUNTER — Ambulatory Visit
Admission: RE | Admit: 2023-04-21 | Discharge: 2023-04-21 | Disposition: A | Discharge status: Home / Self Care | Payer: BC Managed Care – PPO | Source: Ambulatory Visit | Attending: Family Medicine | Admitting: Family Medicine

## 2023-04-21 ENCOUNTER — Other Ambulatory Visit (INDEPENDENT_AMBULATORY_CARE_PROVIDER_SITE_OTHER): Payer: BC Managed Care – PPO

## 2023-04-21 ENCOUNTER — Telehealth: Payer: Self-pay

## 2023-04-21 ENCOUNTER — Encounter: Payer: Self-pay | Admitting: Internal Medicine

## 2023-04-21 VITALS — BP 122/70 | HR 81 | Temp 97.6°F | Ht 61.75 in | Wt 168.0 lb

## 2023-04-21 DIAGNOSIS — E785 Hyperlipidemia, unspecified: Secondary | ICD-10-CM

## 2023-04-21 DIAGNOSIS — L02214 Cutaneous abscess of groin: Secondary | ICD-10-CM | POA: Insufficient documentation

## 2023-04-21 DIAGNOSIS — R591 Generalized enlarged lymph nodes: Secondary | ICD-10-CM | POA: Diagnosis not present

## 2023-04-21 DIAGNOSIS — R202 Paresthesia of skin: Secondary | ICD-10-CM | POA: Diagnosis not present

## 2023-04-21 LAB — COMPREHENSIVE METABOLIC PANEL
ALT: 11 U/L (ref 0–35)
AST: 10 U/L (ref 0–37)
Albumin: 4.2 g/dL (ref 3.5–5.2)
Alkaline Phosphatase: 52 U/L (ref 39–117)
BUN: 22 mg/dL (ref 6–23)
CO2: 29 mEq/L (ref 19–32)
Calcium: 9.2 mg/dL (ref 8.4–10.5)
Chloride: 103 mEq/L (ref 96–112)
Creatinine, Ser: 0.92 mg/dL (ref 0.40–1.20)
GFR: 64.62 mL/min (ref >60.00)
Glucose, Bld: 143 mg/dL — ABNORMAL HIGH (ref 70–99)
Potassium: 4.1 mEq/L (ref 3.5–5.1)
Sodium: 140 mEq/L (ref 135–145)
Total Bilirubin: 0.6 mg/dL (ref 0.2–1.2)
Total Protein: 6.6 g/dL (ref 6.0–8.3)

## 2023-04-21 LAB — CBC WITH DIFFERENTIAL/PLATELET
Eosinophils Relative: 6.4 %
MCV: 89 fL (ref 80.0–100.0)
MPV: 12.7 fL — ABNORMAL HIGH (ref 7.5–12.5)
Neutro Abs: 2980 cells/uL (ref 1500–7800)
Neutrophils Relative %: 57.3 %
Platelets: 218 10*3/uL (ref 140–400)
RBC: 4.37 10*6/uL (ref 3.80–5.10)
RDW: 12.7 % (ref 11.0–15.0)

## 2023-04-21 LAB — LIPID PANEL
Cholesterol: 107 mg/dL (ref 0–200)
HDL: 37.4 mg/dL — ABNORMAL LOW (ref >39.00)
LDL Cholesterol: 44 mg/dL (ref 0–99)
NonHDL: 69.76
Total CHOL/HDL Ratio: 3
Triglycerides: 128 mg/dL (ref 0.0–149.0)
VLDL: 25.6 mg/dL (ref 0.0–40.0)

## 2023-04-21 LAB — SEDIMENTATION RATE: Sed Rate: 15 mm/hr (ref 0–30)

## 2023-04-21 LAB — TSH: TSH: 2.86 u[IU]/mL (ref 0.35–5.50)

## 2023-04-21 MED ORDER — DOXYCYCLINE HYCLATE 100 MG PO TABS
100.0000 mg | ORAL_TABLET | Freq: Two times a day (BID) | ORAL | 0 refills | Status: DC
Start: 2023-04-21 — End: 2024-01-10

## 2023-04-21 NOTE — Progress Notes (Signed)
Subjective:    Patient ID: Susan Daniels, female    DOB: [birth year], 67 y.o.   MRN: [id number]  HPI Here due to a lump in her groin  Was here 2 days ago---had lump in groin Thought it might be lymph node--set her up for blood work today Noticed it larger in the shower this morning Had little hole in center--and then had discharge Some pain if manipulated  Current Outpatient Medications on File Prior to Visit  Medication Sig Dispense Refill   allopurinol (ZYLOPRIM) 100 MG tablet TAKE 1 TABLET BY MOUTH EVERY DAY 90 tablet 0   aspirin 81 MG chewable tablet Chew 81 mg by mouth daily.      B-D ULTRA-FINE 33 LANCETS MISC Use as directed to test blood glucose     Blood Glucose Monitoring Suppl (GLUCOCOM BLOOD GLUCOSE MONITOR) DEVI Use for glucose testing as directed     CALCIUM PO Take by mouth daily.     Cholecalciferol (VITAMIN D PO) Take by mouth daily.     Empagliflozin-metFORMIN HCl (SYNJARDY) 12.03-999 MG TABS Take 1 tablet by mouth 2 (two) times daily. 180 tablet 3   glimepiride (AMARYL) 1 MG tablet Take 1 tablet (1 mg total) by mouth daily with breakfast. 90 tablet 3   leflunomide (ARAVA) 10 MG tablet TAKE 1 TABLET BY MOUTH EVERY DAY 90 tablet 0   lisinopril (PRINIVIL,ZESTRIL) 2.5 MG tablet lisinopril 2.5 mg tablet     metoprolol tartrate (LOPRESSOR) 25 MG tablet TAKE 1/2 TABLET TWICE A DAY BY MOUTH 90 tablet 3   nitroGLYCERIN (NITROSTAT) 0.4 MG SL tablet Place 1 tablet (0.4 mg total) under the tongue as needed. 25 tablet 3   OneTouch Delica Lancets 33G MISC CHECK BLOOD SUGAR TWICE A DAY DX E11.65 FINGERSTICK 90 DAYS     ONETOUCH VERIO test strip CHECK BLOOD SUGAR TWICE A DAY PRIOR TO MEAL     rosuvastatin (CRESTOR) 10 MG tablet Take 10 mg by mouth at bedtime.      Semaglutide, 1 MG/DOSE, 4 MG/3ML SOPN Inject 1 mg as directed once a week. 9 mL 3   No current facility-administered medications on file prior to visit.    Allergies  Allergen Reactions   Aspirin     Can not  take high doses of aspirin- h/o heart racing with use    Epinephrine    Hydrocodone Nausea And Vomiting    Past Medical History:  Diagnosis Date   Arthritis    Rheumatoid   CAD (coronary artery disease)    s/p stent x1   Diabetes mellitus without complication (HCC)    Headache(784.0)    migraines   Heart murmur     Past Surgical History:  Procedure Laterality Date   COLONOSCOPY WITH PROPOFOL N/A 01/30/2013   Procedure: COLONOSCOPY WITH PROPOFOL;  Surgeon: Charolett Bumpers, MD;  Location: [facility];  Service: Endoscopy;  Laterality: N/A;   dental implant  09/29/2018   HEART STENT     TUBAL LIGATION      Family History  Problem Relation Age of Onset   Healthy Mother    Heart disease Father    Rheumatic fever Father    Hypertension Sister    Rheumatic fever Sister    Healthy Daughter    Healthy Son    Breast cancer Maternal Aunt    Colon cancer Neg Hx     Social History   Socioeconomic History   Marital status: Divorced    Spouse name: Not on  file   Number of children: Not on file   Years of education: Not on file   Highest education level: Not on file  Occupational History   Not on file  Tobacco Use   Smoking status: Former    Packs/day: 0.50    Years: 7.00    Additional pack years: 0.00    Total pack years: 3.50    Types: Cigarettes    Quit date: 11/09/1979    Years since quitting: 43.4    Passive exposure: Never   Smokeless tobacco: Never  Vaping Use   Vaping Use: Never used  Substance and Sexual Activity   Alcohol use: Yes    Comment: occasionally    Drug use: Never   Sexual activity: Not on file  Other Topics Concern   Not on file  Social History Narrative   From [place].   Divorced.   Lives alone.   Enjoys hiking and fishing.   2 children.  1 son in [place] city, 1 daughter in [place].      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   Social Determinants of Health   Financial Resource  Strain: Not on file  Food Insecurity: Not on file  Transportation Needs: Not on file  Physical Activity: Not on file  Stress: Not on file  Social Connections: Not on file  Intimate Partner Violence: Not on file   Review of Systems No fever No other lumps     Objective:   Physical Exam Skin:    Comments: 2cm indurated area with granulated area at top Mild inflammation Seems to be in fat pad--not truly in inguinal area            Assessment & Plan:

## 2023-04-21 NOTE — Assessment & Plan Note (Signed)
I suspect this is a cyst--not a node Discussed warm compresses and trying to express any pus Doxy 100 bid x 7 days

## 2023-04-21 NOTE — Telephone Encounter (Signed)
Pt was here for lab. Pt was seen 04/18/23 and had lump in groin;this morning lump is larger; size of walnut and draining purulent drainage.n no pain unless mash area. T 97 P 75 BP 120/80 pulse ox 98% room air. Pt scheduled appt with Dr Alphonsus Sias 04/21/23 at 10:15 am.(Dr Para March did not have available appt today)Sending note to Dr Alphonsus Sias and Alphonsus Sias pool.,

## 2023-04-21 NOTE — Telephone Encounter (Signed)
Okay--I will check her in the office

## 2023-04-22 LAB — CBC WITH DIFFERENTIAL/PLATELET
Absolute Monocytes: 525 cells/uL (ref 200–950)
Basophils Absolute: 62 cells/uL (ref 0–200)
Basophils Relative: 1.2 %
Eosinophils Absolute: 333 cells/uL (ref 15–500)
HCT: 38.9 % (ref 35.0–45.0)
Hemoglobin: 12.9 g/dL (ref 11.7–15.5)
Lymphs Abs: 1300 cells/uL (ref 850–3900)
MCH: 29.5 pg (ref 27.0–33.0)
MCHC: 33.2 g/dL (ref 32.0–36.0)
Monocytes Relative: 10.1 %
Total Lymphocyte: 25 %
WBC: 5.2 10*3/uL (ref 3.8–10.8)

## 2023-04-22 LAB — PATHOLOGIST SMEAR REVIEW

## 2023-04-24 ENCOUNTER — Other Ambulatory Visit: Payer: Self-pay | Admitting: Family Medicine

## 2023-04-24 DIAGNOSIS — R202 Paresthesia of skin: Secondary | ICD-10-CM

## 2023-04-25 ENCOUNTER — Encounter: Payer: Self-pay | Admitting: Neurology

## 2023-04-26 ENCOUNTER — Telehealth: Payer: Self-pay | Admitting: Family Medicine

## 2023-04-26 NOTE — Telephone Encounter (Signed)
Patient called in regarding her lab results.I relayed the message from Dr Para March regarding her labs. She okayed and understood everything.She was excited about the good news. She did say that her cyst has drained some but not a lot.She stated that the antibiotic made the cyst smaller but not drain much. She said that she is scheduled for on MRI consultation on July 22nd  with Dr .Allena Katz.

## 2023-04-26 NOTE — Telephone Encounter (Signed)
Patient is schedule to see Neurology July 22nd not MRI.

## 2023-05-18 NOTE — Progress Notes (Deleted)
Office Visit Note  Patient: Susan Daniels             Date of Birth: 1956/08/06           MRN: [id number]             PCP: Joaquim Nam, MD Referring: Thana Ates, MD Visit Date: 06/01/2023 Occupation: @GUAROCC @  Subjective:  No chief complaint on file.   History of Present Illness: Susan Daniels is a 67 y.o. female ***     Activities of Daily Living:  Patient reports morning stiffness for *** {minute/hour:19697}.   Patient {ACTIONS;DENIES/REPORTS:21021675::"Denies"} nocturnal pain.  Difficulty dressing/grooming: {ACTIONS;DENIES/REPORTS:21021675::"Denies"} Difficulty climbing stairs: {ACTIONS;DENIES/REPORTS:21021675::"Denies"} Difficulty getting out of chair: {ACTIONS;DENIES/REPORTS:21021675::"Denies"} Difficulty using hands for taps, buttons, cutlery, and/or writing: {ACTIONS;DENIES/REPORTS:21021675::"Denies"}  No Rheumatology ROS completed.   PMFS History:  Patient Active Problem List   Diagnosis Date Noted   Abscess of left groin 04/21/2023   Facial paresthesia 04/19/2023   Vertigo 04/19/2023   Lymphadenopathy 04/19/2023   Gout 03/30/2023   Healthcare maintenance 03/30/2023   Advance care planning 03/30/2023   Type 2 diabetes mellitus with hyperglycemia, without long-term current use of insulin (HCC) 05/19/2022   CAD (coronary artery disease), native coronary artery 06/16/2020   Autoimmune disease (HCC) 06/03/2017   High risk medication use 06/03/2017   Primary osteoarthritis of both hands 06/03/2017   Primary osteoarthritis of both knees 06/03/2017   Primary osteoarthritis of both feet 06/03/2017   Heart murmur 06/03/2017   History of hypothyroidism 06/03/2017   Dyslipidemia 06/03/2017   Vitamin D deficiency 06/03/2017    Past Medical History:  Diagnosis Date   Arthritis    Rheumatoid   CAD (coronary artery disease)    s/p stent x1   Diabetes mellitus without complication (HCC)    Headache(784.0)    migraines   Heart murmur     Family  History  Problem Relation Age of Onset   Healthy Mother    Heart disease Father    Rheumatic fever Father    Hypertension Sister    Rheumatic fever Sister    Healthy Daughter    Healthy Son    Breast cancer Maternal Aunt    Colon cancer Neg Hx    Past Surgical History:  Procedure Laterality Date   COLONOSCOPY WITH PROPOFOL N/A 01/30/2013   Procedure: COLONOSCOPY WITH PROPOFOL;  Surgeon: Charolett Bumpers, MD;  Location: [facility];  Service: Endoscopy;  Laterality: N/A;   dental implant  09/29/2018   HEART STENT     TUBAL LIGATION     Social History   Social History Narrative   From [place].   Divorced.   Lives alone.   Enjoys hiking and fishing.   2 children.  1 son in [place] city, 1 daughter in [place].      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   Immunization History  Administered Date(s) Administered   Fluad Quad(high Dose 65+) 10/07/2022   Influenza-Unspecified 08/08/2012, 08/10/2016, 07/09/2017, 08/29/2021   PFIZER(Purple Top)SARS-COV-2 Vaccination 01/29/2020, 02/19/2020, 10/10/2020   Zoster Recombinant(Shingrix) 08/30/2022     Objective: Vital Signs: There were no vitals taken for this visit.   Physical Exam   Musculoskeletal Exam: ***  CDAI Exam: CDAI Score: -- Patient Global: --; Provider Global: -- Swollen: --; Tender: -- Joint Exam 06/01/2023   No joint exam has been documented for this visit   There is currently no information documented on the homunculus. Go to the Rheumatology  activity and complete the homunculus joint exam.  Investigation: No additional findings.  Imaging: MR Brain Wo Contrast  Result Date: 04/21/2023 CLINICAL DATA:  Facial paresthesia. Sensory abnormality, trigeminal origin. EXAM: MRI HEAD WITHOUT CONTRAST TECHNIQUE: Multiplanar, multiecho pulse sequences of the brain and surrounding structures were obtained without intravenous contrast. COMPARISON:  None Available.  FINDINGS: Brain: No acute infarction, hemorrhage, hydrocephalus, extra-axial collection or mass lesion. Small amount scattered foci of T2 hyperintensity are seen within the white matter the cerebral hemispheres nonspecific distribution. Vascular: Normal flow voids. Skull and upper cervical spine: Normal marrow signal. Sinuses/Orbits: Trace mucosal thickening of the ethmoid cells. The orbits are maintained. Other: None. IMPRESSION: Small amount of nonspecific T2 hyperintense lesions of the white matter. Differential diagnosis include early chronic microangiopathy, demyelinating disease and post inflammatory/infectious processes. Electronically Signed   By: Baldemar Lenis M.D.   On: 04/21/2023 15:23    Recent Labs: Lab Results  Component Value Date   WBC 5.2 04/21/2023   HGB 12.9 04/21/2023   PLT 218 04/21/2023   NA 140 04/21/2023   K 4.1 04/21/2023   CL 103 04/21/2023   CO2 29 04/21/2023   GLUCOSE 143 (H) 04/21/2023   BUN 22 04/21/2023   CREATININE 0.92 04/21/2023   BILITOT 0.6 04/21/2023   ALKPHOS 52 04/21/2023   AST 10 04/21/2023   ALT 11 04/21/2023   PROT 6.6 04/21/2023   ALBUMIN 4.2 04/21/2023   CALCIUM 9.2 04/21/2023   GFRAA 68 05/05/2021   QFTBGOLDPLUS NEGATIVE 03/02/2021    Speciality Comments: PLQ eye exam: 09/08/2020 WNL [facility] Follow up in 6 months. Abnormal OCT.  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  No orders of the defined types were placed in this encounter.   Face-to-face time spent with patient was *** minutes. Greater than 50% of time was spent in counseling and coordination of care.  Follow-Up Instructions: No follow-ups on file.   Gearldine Bienenstock, PA-C  Note - This record has been created using Dragon software.  Chart creation errors have been sought, but may not always  have been located. Such creation errors do not reflect on  the standard of medical care.

## 2023-05-24 ENCOUNTER — Other Ambulatory Visit: Payer: Self-pay | Admitting: Physician Assistant

## 2023-05-24 NOTE — Telephone Encounter (Signed)
Last Fill: 02/28/2023  Labs: 04/21/2023 Glucose 143, MPV 12.7, 12/29/2022 Uric Acid 3.4  Next Visit: 06/01/2023  Last Visit: 12/29/2022  DX: Chronic idiopathic gout involving toe of right foot without tophus   Current Dose per office note 12/29/2022: allopurinol 100 mg 1 tablet by mouth daily.   Okay to refill Allopurinol?

## 2023-05-30 ENCOUNTER — Ambulatory Visit: Payer: BC Managed Care – PPO | Admitting: Neurology

## 2023-05-30 ENCOUNTER — Encounter: Payer: Self-pay | Admitting: Neurology

## 2023-05-30 VITALS — BP 123/71 | HR 70 | Ht 61.75 in | Wt 170.0 lb

## 2023-05-30 DIAGNOSIS — R202 Paresthesia of skin: Secondary | ICD-10-CM | POA: Diagnosis not present

## 2023-05-30 NOTE — Progress Notes (Signed)
[facility] Neurology Division Clinic Note - Initial Visit   Date: 05/30/2023   Susan Daniels MRN: [id number] DOB: 16-Jan-1956   Dear Dr. Para March:  Thank you for your kind referral of Susan Daniels for consultation of facial numbness. Although her history is well known to you, please allow [place] to reiterate it for the purpose of our medical record. The patient was accompanied to the clinic by self,   Susan Daniels is a 67 y.o. 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   IMPRESSION/PLAN: Transient left facial numbness involving the V2-V3 which lasted about 2 weeks and resolved.  She has not had recurrence of symptoms. MRI brain was personally viewed with patient while shows scattered white matter changes, nothing which would explain her facial numbness. There is a slightly larger white matter hyperintensity over the left parietal region.  This would not cause left facial symptoms.  If she has any recurrence or new neurological symptoms, repeating MRI brain/face wwo contrast can be ordered.   ------------------------------------------------------------- History of present illness: Starting in June 2024, she began having lightheadedness and left facial numbness and hypersensivity involving the cheek and jaw.  It did not involve the forehead.  Symptoms were constant for two weeks and then resolved.  No associated headache, vision changes, facial weakness, difficulty swallowing/talking, or limb weakness.  No numbness involving the left arm and leg.  Symptoms have not recurred.  She had MRI brain wo contrast on 6/13 which showed white matter changes, nothing to explain her facial symptoms.    Out-side paper records, electronic medical record, and images have been reviewed where available and summarized as:  MRI brain wo contrast 04/21/2023: Small amount of  nonspecific T2 hyperintense lesions of the white matter. Differential diagnosis include early chronic microangiopathy, demyelinating disease and post inflammatory/infectious processes.  Lab Results  Component Value Date   HGBA1C 8.0 (A) 04/11/2023   No results found for: "VITAMINB12" Lab Results  Component Value Date   TSH 2.86 04/21/2023   Lab Results  Component Value Date   ESRSEDRATE 15 04/21/2023    Past Medical History:  Diagnosis Date   Arthritis    Rheumatoid   CAD (coronary artery disease)    s/p stent x1   Diabetes mellitus without complication (HCC)    Headache(784.0)    migraines   Heart murmur     Past Surgical History:  Procedure Laterality Date   COLONOSCOPY WITH PROPOFOL N/A 01/30/2013   Procedure: COLONOSCOPY WITH PROPOFOL;  Surgeon: Charolett Bumpers, MD;  Location: [facility];  Service: Endoscopy;  Laterality: N/A;   dental implant  09/29/2018   HEART STENT     TUBAL LIGATION       Medications:  Outpatient Encounter Medications as of 05/30/2023  Medication Sig   allopurinol (ZYLOPRIM) 100 MG tablet TAKE 1 TABLET BY MOUTH EVERY DAY   aspirin 81 MG chewable tablet Chew 81 mg by mouth daily.    B-D ULTRA-FINE 33 LANCETS MISC Use as directed to test blood glucose   Blood Glucose Monitoring Suppl (GLUCOCOM BLOOD GLUCOSE MONITOR) DEVI Use for glucose testing as directed   CALCIUM PO Take by mouth daily.   Cholecalciferol (VITAMIN D PO) Take by mouth daily.   Empagliflozin-metFORMIN HCl (SYNJARDY) 12.03-999 MG TABS Take 1 tablet by mouth 2 (two) times daily.   glimepiride (AMARYL) 1 MG tablet Take 1 tablet (1 mg total) by  mouth daily with breakfast.   leflunomide (ARAVA) 10 MG tablet TAKE 1 TABLET BY MOUTH EVERY DAY   lisinopril (PRINIVIL,ZESTRIL) 2.5 MG tablet lisinopril 2.5 mg tablet   metoprolol tartrate (LOPRESSOR) 25 MG tablet TAKE 1/2 TABLET TWICE A DAY BY MOUTH   nitroGLYCERIN (NITROSTAT) 0.4 MG SL tablet Place 1 tablet (0.4 mg total) under the  tongue as needed.   OneTouch Delica Lancets 33G MISC CHECK BLOOD SUGAR TWICE A DAY DX E11.65 FINGERSTICK 90 DAYS   ONETOUCH VERIO test strip CHECK BLOOD SUGAR TWICE A DAY PRIOR TO MEAL   rosuvastatin (CRESTOR) 10 MG tablet Take 5 mg by mouth at bedtime. 1/2 tablet once a day   Semaglutide, 1 MG/DOSE, 4 MG/3ML SOPN Inject 1 mg as directed once a week.   doxycycline (VIBRA-TABS) 100 MG tablet Take 1 tablet (100 mg total) by mouth 2 (two) times daily. (Patient not taking: Reported on 05/30/2023)   No facility-administered encounter medications on file as of 05/30/2023.    Allergies:  Allergies  Allergen Reactions   Aspirin     Can not take high doses of aspirin- h/o heart racing with use    Epinephrine    Hydrocodone Nausea And Vomiting    Family History: Family History  Problem Relation Age of Onset   Healthy Mother    Heart disease Father    Rheumatic fever Father    Hypertension Sister    Rheumatic fever Sister    Breast cancer Maternal Aunt    Dementia Maternal Uncle    Rheumatic fever Maternal Grandmother    Heart Problems Maternal Grandmother    Healthy Daughter    Healthy Son    Colon cancer Neg Hx     Social History: Social History   Tobacco Use   Smoking status: Former    Current packs/day: 0.00    Average packs/day: 0.5 packs/day for 7.0 years (3.5 ttl pk-yrs)    Types: Cigarettes    Start date: 11/08/1972    Quit date: 11/09/1979    Years since quitting: 43.5    Passive exposure: Never   Smokeless tobacco: Never  Vaping Use   Vaping status: Never Used  Substance Use Topics   Alcohol use: Yes    Comment: occasionally    Drug use: Never   Social History   Social History Narrative   From [place].   Divorced.   Lives alone.   Enjoys hiking and fishing.   2 children.  1 son in [place] city, 1 daughter in [place].      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      Are you right handed or left handed?  Right Handed    Are you currently employed ? 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 Yes   Who lives with you?    What type of home do you live in: 1 story or 2 story? Lives in a one story home with one room upstairs         Vital Signs:  BP 123/71   Pulse 70   Ht 5' 1.75" (1.568 m)   Wt 170 lb (77.1 kg)   SpO2 98%   BMI 31.35 kg/m    Neurological Exam: MENTAL STATUS including orientation to time, place, person, recent and remote memory, attention span and concentration, language, and fund of knowledge is normal.  Speech is not dysarthric.  CRANIAL NERVES: II:  No visual field defects.     III-IV-VI: Pupils equal round and reactive to light.  Normal conjugate, extra-ocular eye movements in all directions of gaze.  No nystagmus.  Mild left ptosis(old).   V:  Normal facial sensation.    VII:  Normal facial symmetry and movements.   VIII:  Normal hearing and vestibular function.   IX-X:  Normal palatal movement.   XI:  Normal shoulder shrug and head rotation.   XII:  Normal tongue strength and range of motion, no deviation or fasciculation.  MOTOR:  No atrophy, fasciculations or abnormal movements.  No pronator drift.   Upper Extremity:  Right  Left  Deltoid  5/5   5/5   Biceps  5/5   5/5   Triceps  5/5   5/5   Wrist extensors  5/5   5/5   Wrist flexors  5/5   5/5   Finger extensors  5/5   5/5   Finger flexors  5/5   5/5   Dorsal interossei  5/5   5/5   Abductor pollicis  5/5   5/5   Tone (Ashworth scale)  0  0   Lower Extremity:  Right  Left  Hip flexors  5/5   5/5   Hip extensors  5/5   5/5   Knee flexors  5/5   5/5   Knee extensors  5/5   5/5   Dorsiflexors  5/5   5/5   Plantarflexors  5/5   5/5   Toe extensors  5/5   5/5   Toe flexors  5/5   5/5   Tone (Ashworth scale)  0  0   MSRs:                                           Right        Left brachioradialis 2+  2+  biceps 2+  2+  triceps 2+  2+  patellar 2+  2+   ankle jerk 1+  1+  Hoffman no  no  plantar response down  down   SENSORY:  Normal and symmetric perception of light touch, pinprick, vibration, and proprioception.  Romberg's sign absent.   COORDINATION/GAIT: Normal finger-to- nose-finger.  Intact rapid alternating movements bilaterally.   Gait narrow based and stable. Mild unsteadiness with tandem gait.  Stressed gait intact.   Thank you for allowing me to participate in patient's care.  If I can answer any additional questions, I would be pleased to do so.    Sincerely,    [place] K. Allena Katz, DO

## 2023-05-30 NOTE — Patient Instructions (Signed)
If your symptoms return, please come back and see me.

## 2023-06-01 ENCOUNTER — Ambulatory Visit: Payer: BC Managed Care – PPO | Admitting: Physician Assistant

## 2023-06-01 DIAGNOSIS — M19071 Primary osteoarthritis, right ankle and foot: Secondary | ICD-10-CM

## 2023-06-01 DIAGNOSIS — M1A0710 Idiopathic chronic gout, right ankle and foot, without tophus (tophi): Secondary | ICD-10-CM

## 2023-06-01 DIAGNOSIS — R011 Cardiac murmur, unspecified: Secondary | ICD-10-CM

## 2023-06-01 DIAGNOSIS — M170 Bilateral primary osteoarthritis of knee: Secondary | ICD-10-CM

## 2023-06-01 DIAGNOSIS — M19041 Primary osteoarthritis, right hand: Secondary | ICD-10-CM

## 2023-06-01 DIAGNOSIS — M359 Systemic involvement of connective tissue, unspecified: Secondary | ICD-10-CM

## 2023-06-01 DIAGNOSIS — Z79899 Other long term (current) drug therapy: Secondary | ICD-10-CM

## 2023-06-01 DIAGNOSIS — Z8639 Personal history of other endocrine, nutritional and metabolic disease: Secondary | ICD-10-CM

## 2023-06-01 DIAGNOSIS — H35389 Toxic maculopathy, unspecified eye: Secondary | ICD-10-CM

## 2023-06-01 DIAGNOSIS — E785 Hyperlipidemia, unspecified: Secondary | ICD-10-CM

## 2023-06-01 DIAGNOSIS — M8589 Other specified disorders of bone density and structure, multiple sites: Secondary | ICD-10-CM

## 2023-06-01 NOTE — Progress Notes (Deleted)
Office Visit Note  Patient: Susan Daniels             Date of Birth: 1955/11/10           MRN: [id number]             PCP: Joaquim Nam, MD Referring: Thana Ates, MD Visit Date: 06/09/2023 Occupation: @GUAROCC @  Subjective:    History of Present Illness: Susan Daniels is a 67 y.o. female with history of autoimmune disease and gout.  Patient remains on Arava 10 mg 1 tablet by mouth daily.    She is taking allopurinol 100 mg daily for management of gout.   DEXA updated 10/18/22--osteopenia-T-score -1.6.    Lab work from 12/29/22 was reviewed today: ANA 1:1280NH, no proteinuria, complements Wnl, ESR WNL, dsDNA negative, uric acid WNL.   ESR WNL on 04/21/23.   CBC and CMP updated on 04/21/23.  Her next lab work will be due in September and every 3 months. Discussed the importance of holding arava if she develops signs or symptoms of an infection and to resume once the infection has completely cleared.   Activities of Daily Living:  Patient reports morning stiffness for *** {minute/hour:19697}.   Patient {ACTIONS;DENIES/REPORTS:21021675::"Denies"} nocturnal pain.  Difficulty dressing/grooming: {ACTIONS;DENIES/REPORTS:21021675::"Denies"} Difficulty climbing stairs: {ACTIONS;DENIES/REPORTS:21021675::"Denies"} Difficulty getting out of chair: {ACTIONS;DENIES/REPORTS:21021675::"Denies"} Difficulty using hands for taps, buttons, cutlery, and/or writing: {ACTIONS;DENIES/REPORTS:21021675::"Denies"}  No Rheumatology ROS completed.   PMFS History:  Patient Active Problem List   Diagnosis Date Noted   Abscess of left groin 04/21/2023   Facial paresthesia 04/19/2023   Vertigo 04/19/2023   Lymphadenopathy 04/19/2023   Gout 03/30/2023   Healthcare maintenance 03/30/2023   Advance care planning 03/30/2023   Type 2 diabetes mellitus with hyperglycemia, without long-term current use of insulin (HCC) 05/19/2022   CAD (coronary artery disease), native coronary artery 06/16/2020    Autoimmune disease (HCC) 06/03/2017   High risk medication use 06/03/2017   Primary osteoarthritis of both hands 06/03/2017   Primary osteoarthritis of both knees 06/03/2017   Primary osteoarthritis of both feet 06/03/2017   Heart murmur 06/03/2017   History of hypothyroidism 06/03/2017   Dyslipidemia 06/03/2017   Vitamin D deficiency 06/03/2017    Past Medical History:  Diagnosis Date   Arthritis    Rheumatoid   CAD (coronary artery disease)    s/p stent x1   Diabetes mellitus without complication (HCC)    Headache(784.0)    migraines   Heart murmur     Family History  Problem Relation Age of Onset   Healthy Mother    Heart disease Father    Rheumatic fever Father    Hypertension Sister    Rheumatic fever Sister    Breast cancer Maternal Aunt    Dementia Maternal Uncle    Rheumatic fever Maternal Grandmother    Heart Problems Maternal Grandmother    Healthy Daughter    Healthy Son    Colon cancer Neg Hx    Past Surgical History:  Procedure Laterality Date   COLONOSCOPY WITH PROPOFOL N/A 01/30/2013   Procedure: COLONOSCOPY WITH PROPOFOL;  Surgeon: Charolett Bumpers, MD;  Location: [facility];  Service: Endoscopy;  Laterality: N/A;   dental implant  09/29/2018   HEART STENT     TUBAL LIGATION     Social History   Social History Narrative   From [place].   Divorced.   Lives alone.   Enjoys hiking and fishing.   2 children.  1 son in [place]  city, 1 daughter in [place].      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      Are you right handed or left handed? Right Handed    Are you currently employed ? 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 Yes   Who lives with you?    What type of home do you live in: 1 story or 2 story? Lives in a one story home with one room upstairs        Immunization History  Administered Date(s) Administered   Fluad Quad(high Dose 65+)  10/07/2022   Influenza-Unspecified 08/08/2012, 08/10/2016, 07/09/2017, 08/29/2021   PFIZER(Purple Top)SARS-COV-2 Vaccination 01/29/2020, 02/19/2020, 10/10/2020   Zoster Recombinant(Shingrix) 08/30/2022     Objective: Vital Signs: There were no vitals taken for this visit.   Physical Exam Vitals and nursing note reviewed.  Constitutional:      Appearance: She is well-developed.  HENT:     Head: Normocephalic and atraumatic.  Eyes:     Conjunctiva/sclera: Conjunctivae normal.  Cardiovascular:     Rate and Rhythm: Normal rate and regular rhythm.     Heart sounds: Normal heart sounds.  Pulmonary:     Effort: Pulmonary effort is normal.     Breath sounds: Normal breath sounds.  Abdominal:     General: Bowel sounds are normal.     Palpations: Abdomen is soft.  Musculoskeletal:     Cervical back: Normal range of motion.  Lymphadenopathy:     Cervical: No cervical adenopathy.  Skin:    General: Skin is warm and dry.     Capillary Refill: Capillary refill takes less than 2 seconds.  Neurological:     Mental Status: She is alert and oriented to person, place, and time.  Psychiatric:        Behavior: Behavior normal.      Musculoskeletal Exam: ***  CDAI Exam: CDAI Score: -- Patient Global: --; Provider Global: -- Swollen: --; Tender: -- Joint Exam 06/09/2023   No joint exam has been documented for this visit   There is currently no information documented on the homunculus. Go to the Rheumatology activity and complete the homunculus joint exam.  Investigation: No additional findings.  Imaging: No results found.  Recent Labs: Lab Results  Component Value Date   WBC 5.2 04/21/2023   HGB 12.9 04/21/2023   PLT 218 04/21/2023   NA 140 04/21/2023   K 4.1 04/21/2023   CL 103 04/21/2023   CO2 29 04/21/2023   GLUCOSE 143 (H) 04/21/2023   BUN 22 04/21/2023   CREATININE 0.92 04/21/2023   BILITOT 0.6 04/21/2023   ALKPHOS 52 04/21/2023   AST 10 04/21/2023   ALT 11  04/21/2023   PROT 6.6 04/21/2023   ALBUMIN 4.2 04/21/2023   CALCIUM 9.2 04/21/2023   GFRAA 68 05/05/2021   QFTBGOLDPLUS NEGATIVE 03/02/2021    Speciality Comments: PLQ eye exam: 09/08/2020 WNL [facility] Follow up in 6 months. Abnormal OCT.  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  No orders of the defined types were placed in this encounter.     Follow-Up Instructions: No follow-ups on file.   Gearldine Bienenstock, PA-C  Note - This record has been created using Dragon software.  Chart creation errors have been sought, but may not always  have been located. Such creation errors do not reflect on  the standard of medical care.

## 2023-06-09 ENCOUNTER — Ambulatory Visit: Payer: BC Managed Care – PPO | Admitting: Physician Assistant

## 2023-06-09 DIAGNOSIS — Z79899 Other long term (current) drug therapy: Secondary | ICD-10-CM

## 2023-06-09 DIAGNOSIS — M170 Bilateral primary osteoarthritis of knee: Secondary | ICD-10-CM

## 2023-06-09 DIAGNOSIS — M19042 Primary osteoarthritis, left hand: Secondary | ICD-10-CM

## 2023-06-09 DIAGNOSIS — M19071 Primary osteoarthritis, right ankle and foot: Secondary | ICD-10-CM

## 2023-06-09 DIAGNOSIS — M359 Systemic involvement of connective tissue, unspecified: Secondary | ICD-10-CM

## 2023-06-09 DIAGNOSIS — M1A0710 Idiopathic chronic gout, right ankle and foot, without tophus (tophi): Secondary | ICD-10-CM

## 2023-06-09 DIAGNOSIS — E785 Hyperlipidemia, unspecified: Secondary | ICD-10-CM

## 2023-06-09 DIAGNOSIS — R011 Cardiac murmur, unspecified: Secondary | ICD-10-CM

## 2023-06-09 DIAGNOSIS — M8589 Other specified disorders of bone density and structure, multiple sites: Secondary | ICD-10-CM

## 2023-06-09 DIAGNOSIS — Z8639 Personal history of other endocrine, nutritional and metabolic disease: Secondary | ICD-10-CM

## 2023-06-09 DIAGNOSIS — T372X5A Adverse effect of antimalarials and drugs acting on other blood protozoa, initial encounter: Secondary | ICD-10-CM

## 2023-06-11 NOTE — Progress Notes (Deleted)
Office Visit Note  Patient: Susan Daniels             Date of Birth: Aug 23, 1956           MRN: [id number]             PCP: Joaquim Nam, MD Referring: Thana Ates, MD Visit Date: 06/15/2023 Occupation: @GUAROCC @  Subjective:    History of Present Illness: Susan Daniels is a 67 y.o. female with history of autoimmune disease and gout.  Patient remains on Arava 10 mg 1 tablet by mouth daily.    She is taking allopurinol 100 mg daily for management of gout.   DEXA updated 10/18/22--osteopenia-T-score -1.6.    Lab work from 12/29/22 was reviewed today: ANA 1:1280NH, no proteinuria, complements Wnl, ESR WNL, dsDNA negative, uric acid WNL.   ESR WNL on 04/21/23.   CBC and CMP updated on 04/21/23.  Her next lab work will be due in September and every 3 months. Discussed the importance of holding arava if she develops signs or symptoms of an infection and to resume once the infection has completely cleared.   Activities of Daily Living:  Patient reports morning stiffness for *** {minute/hour:19697}.   Patient {ACTIONS;DENIES/REPORTS:21021675::"Denies"} nocturnal pain.  Difficulty dressing/grooming: {ACTIONS;DENIES/REPORTS:21021675::"Denies"} Difficulty climbing stairs: {ACTIONS;DENIES/REPORTS:21021675::"Denies"} Difficulty getting out of chair: {ACTIONS;DENIES/REPORTS:21021675::"Denies"} Difficulty using hands for taps, buttons, cutlery, and/or writing: {ACTIONS;DENIES/REPORTS:21021675::"Denies"}  No Rheumatology ROS completed.   PMFS History:  Patient Active Problem List   Diagnosis Date Noted  . Abscess of left groin 04/21/2023  . Facial paresthesia 04/19/2023  . Vertigo 04/19/2023  . Lymphadenopathy 04/19/2023  . Gout 03/30/2023  . Healthcare maintenance 03/30/2023  . Advance care planning 03/30/2023  . Type 2 diabetes mellitus with hyperglycemia, without long-term current use of insulin (HCC) 05/19/2022  . CAD (coronary artery disease), native coronary artery  06/16/2020  . Autoimmune disease (HCC) 06/03/2017  . High risk medication use 06/03/2017  . Primary osteoarthritis of both hands 06/03/2017  . Primary osteoarthritis of both knees 06/03/2017  . Primary osteoarthritis of both feet 06/03/2017  . Heart murmur 06/03/2017  . History of hypothyroidism 06/03/2017  . Dyslipidemia 06/03/2017  . Vitamin D deficiency 06/03/2017    Past Medical History:  Diagnosis Date  . Arthritis    Rheumatoid  . CAD (coronary artery disease)    s/p stent x1  . Diabetes mellitus without complication (HCC)   . Headache(784.0)    migraines  . Heart murmur     Family History  Problem Relation Age of Onset  . Healthy Mother   . Heart disease Father   . Rheumatic fever Father   . Hypertension Sister   . Rheumatic fever Sister   . Breast cancer Maternal Aunt   . Dementia Maternal Uncle   . Rheumatic fever Maternal Grandmother   . Heart Problems Maternal Grandmother   . Healthy Daughter   . Healthy Son   . Colon cancer Neg Hx    Past Surgical History:  Procedure Laterality Date  . COLONOSCOPY WITH PROPOFOL N/A 01/30/2013   Procedure: COLONOSCOPY WITH PROPOFOL;  Surgeon: Charolett Bumpers, MD;  Location: [facility];  Service: Endoscopy;  Laterality: N/A;  . dental implant  09/29/2018  . HEART STENT    . TUBAL LIGATION     Social History   Social History Narrative   From [place].   Divorced.   Lives alone.   Enjoys hiking and fishing.   2 children.  1 son in [place]  city, 1 daughter in [place].      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      Are you right handed or left handed? Right Handed    Are you currently employed ? 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 Yes   Who lives with you?    What type of home do you live in: 1 story or 2 story? Lives in a one story home with one room upstairs        Immunization History  Administered Date(s)  Administered  . Fluad Quad(high Dose 65+) 10/07/2022  . Influenza-Unspecified 08/08/2012, 08/10/2016, 07/09/2017, 08/29/2021  . PFIZER(Purple Top)SARS-COV-2 Vaccination 01/29/2020, 02/19/2020, 10/10/2020  . Zoster Recombinant(Shingrix) 08/30/2022     Objective: Vital Signs: There were no vitals taken for this visit.   Physical Exam Vitals and nursing note reviewed.  Constitutional:      Appearance: She is well-developed.  HENT:     Head: Normocephalic and atraumatic.  Eyes:     Conjunctiva/sclera: Conjunctivae normal.  Cardiovascular:     Rate and Rhythm: Normal rate and regular rhythm.     Heart sounds: Normal heart sounds.  Pulmonary:     Effort: Pulmonary effort is normal.     Breath sounds: Normal breath sounds.  Abdominal:     General: Bowel sounds are normal.     Palpations: Abdomen is soft.  Musculoskeletal:     Cervical back: Normal range of motion.  Lymphadenopathy:     Cervical: No cervical adenopathy.  Skin:    General: Skin is warm and dry.     Capillary Refill: Capillary refill takes less than 2 seconds.  Neurological:     Mental Status: She is alert and oriented to person, place, and time.  Psychiatric:        Behavior: Behavior normal.     Musculoskeletal Exam: ***  CDAI Exam: CDAI Score: -- Patient Global: --; Provider Global: -- Swollen: --; Tender: -- Joint Exam 06/15/2023   No joint exam has been documented for this visit   There is currently no information documented on the homunculus. Go to the Rheumatology activity and complete the homunculus joint exam.  Investigation: No additional findings.  Imaging: No results found.  Recent Labs: Lab Results  Component Value Date   WBC 5.2 04/21/2023   HGB 12.9 04/21/2023   PLT 218 04/21/2023   NA 140 04/21/2023   K 4.1 04/21/2023   CL 103 04/21/2023   CO2 29 04/21/2023   GLUCOSE 143 (H) 04/21/2023   BUN 22 04/21/2023   CREATININE 0.92 04/21/2023   BILITOT 0.6 04/21/2023   ALKPHOS 52  04/21/2023   AST 10 04/21/2023   ALT 11 04/21/2023   PROT 6.6 04/21/2023   ALBUMIN 4.2 04/21/2023   CALCIUM 9.2 04/21/2023   GFRAA 68 05/05/2021   QFTBGOLDPLUS NEGATIVE 03/02/2021    Speciality Comments: PLQ eye exam: 09/08/2020 WNL [facility] Follow up in 6 months. Abnormal OCT.  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  No orders of the defined types were placed in this encounter.     Follow-Up Instructions: No follow-ups on file.   Gearldine Bienenstock, PA-C  Note - This record has been created using Dragon software.  Chart creation errors have been sought, but may not always  have been located. Such creation errors do not reflect on  the standard of medical care.

## 2023-06-12 ENCOUNTER — Telehealth: Payer: Self-pay | Admitting: Family Medicine

## 2023-06-12 DIAGNOSIS — Z1211 Encounter for screening for malignant neoplasm of colon: Secondary | ICD-10-CM

## 2023-06-12 NOTE — Telephone Encounter (Signed)
Please check with patient.  It looks like she is due for follow-up with GI based on her old records, for follow-up colonoscopy.  Please have her contact GI if she has not already done so.  Thanks.

## 2023-06-13 NOTE — Telephone Encounter (Signed)
Spoke with patient and she needs referral to have this done. She does not have a preference on where referral is sent.

## 2023-06-14 NOTE — Addendum Note (Signed)
Addended by: Joaquim Nam on: 06/14/2023 07:00 AM   Modules accepted: Orders

## 2023-06-14 NOTE — Telephone Encounter (Addendum)
Referral placed.  Please let us know if she does not get a call about this from [facility] in the next 2 weeks.  Thanks.

## 2023-06-15 ENCOUNTER — Ambulatory Visit: Payer: BC Managed Care – PPO | Admitting: Physician Assistant

## 2023-06-15 DIAGNOSIS — R011 Cardiac murmur, unspecified: Secondary | ICD-10-CM

## 2023-06-15 DIAGNOSIS — M19041 Primary osteoarthritis, right hand: Secondary | ICD-10-CM

## 2023-06-15 DIAGNOSIS — M359 Systemic involvement of connective tissue, unspecified: Secondary | ICD-10-CM

## 2023-06-15 DIAGNOSIS — H35389 Toxic maculopathy, unspecified eye: Secondary | ICD-10-CM

## 2023-06-15 DIAGNOSIS — Z79899 Other long term (current) drug therapy: Secondary | ICD-10-CM

## 2023-06-15 DIAGNOSIS — E785 Hyperlipidemia, unspecified: Secondary | ICD-10-CM

## 2023-06-15 DIAGNOSIS — M8589 Other specified disorders of bone density and structure, multiple sites: Secondary | ICD-10-CM

## 2023-06-15 DIAGNOSIS — M19071 Primary osteoarthritis, right ankle and foot: Secondary | ICD-10-CM

## 2023-06-15 DIAGNOSIS — Z8639 Personal history of other endocrine, nutritional and metabolic disease: Secondary | ICD-10-CM

## 2023-06-15 DIAGNOSIS — M170 Bilateral primary osteoarthritis of knee: Secondary | ICD-10-CM

## 2023-06-15 DIAGNOSIS — M1A0710 Idiopathic chronic gout, right ankle and foot, without tophus (tophi): Secondary | ICD-10-CM

## 2023-06-16 NOTE — Progress Notes (Signed)
Office Visit Note  Patient: Susan Daniels             Date of Birth: December 10, 1955           MRN: [id number]             PCP: Joaquim Nam, MD Referring: Thana Ates, MD Visit Date: 06/20/2023 Occupation: @GUAROCC @  Subjective:  Medication monitoring  History of Present Illness: Susan Daniels is a 67 y.o. female with history of autoimmune disease and gout.  Patient remains on Arava 10 mg 1 tablet by mouth daily.  She is tolerating Arava without any side effects and has not missed any doses recently.  She denies any signs or symptoms of an autoimmune disease flare.  She is not experiencing any joint pain or joint swelling at this time.  She denies any recent rashes.  Patient states that she does have photosensitivity if she is outdoors for prolonged periods of time.  She denies any symptoms of Raynaud's phenomenon recently.  She has not had any oral or nasal ulcerations.  She denies any sicca symptoms. She denies any recent falls or fractures.  She has been taking a calcium vitamin D supplement daily. She denies any recent gout flares.  She is taking allopurinol 100 mg daily for management of gout.     Activities of Daily Living:  Patient reports morning stiffness for 0 minutes.   Patient Denies nocturnal pain.  Difficulty dressing/grooming: Denies Difficulty climbing stairs: Denies Difficulty getting out of chair: Denies Difficulty using hands for taps, buttons, cutlery, and/or writing: Denies  Review of Systems  Constitutional:  Negative for fatigue.  HENT:  Negative for mouth sores, mouth dryness and nose dryness.   Eyes:  Negative for pain, visual disturbance and dryness.  Respiratory:  Negative for cough, hemoptysis, shortness of breath and difficulty breathing.   Cardiovascular:  Negative for chest pain, palpitations, hypertension and swelling in legs/feet.  Gastrointestinal:  Negative for blood in stool, constipation and diarrhea.  Endocrine: Negative for increased  urination.  Genitourinary:  Negative for painful urination.  Musculoskeletal:  Negative for joint pain, joint pain, joint swelling, myalgias, muscle weakness, morning stiffness, muscle tenderness and myalgias.  Skin:  Positive for sensitivity to sunlight. Negative for color change, pallor, rash, hair loss, nodules/bumps, skin tightness and ulcers.  Allergic/Immunologic: Negative for susceptible to infections.  Neurological:  Negative for dizziness, numbness, headaches and weakness.  Hematological:  Negative for swollen glands.  Psychiatric/Behavioral:  Negative for depressed mood and sleep disturbance. The patient is not nervous/anxious.     PMFS History:  Patient Active Problem List   Diagnosis Date Noted   Abscess of left groin 04/21/2023   Facial paresthesia 04/19/2023   Vertigo 04/19/2023   Lymphadenopathy 04/19/2023   Gout 03/30/2023   Healthcare maintenance 03/30/2023   Advance care planning 03/30/2023   Type 2 diabetes mellitus with hyperglycemia, without long-term current use of insulin (HCC) 05/19/2022   CAD (coronary artery disease), native coronary artery 06/16/2020   Autoimmune disease (HCC) 06/03/2017   High risk medication use 06/03/2017   Primary osteoarthritis of both hands 06/03/2017   Primary osteoarthritis of both knees 06/03/2017   Primary osteoarthritis of both feet 06/03/2017   Heart murmur 06/03/2017   History of hypothyroidism 06/03/2017   Dyslipidemia 06/03/2017   Vitamin D deficiency 06/03/2017    Past Medical History:  Diagnosis Date   Arthritis    Rheumatoid   CAD (coronary artery disease)    s/p stent  x1   Diabetes mellitus without complication (HCC)    Headache(784.0)    migraines   Heart murmur     Family History  Problem Relation Age of Onset   Healthy Mother    Heart disease Father    Rheumatic fever Father    Hypertension Sister    Rheumatic fever Sister    Breast cancer Maternal Aunt    Dementia Maternal Uncle    Rheumatic fever  Maternal Grandmother    Heart Problems Maternal Grandmother    Healthy Daughter    Healthy Son    Colon cancer Neg Hx    Past Surgical History:  Procedure Laterality Date   COLONOSCOPY WITH PROPOFOL N/A 01/30/2013   Procedure: COLONOSCOPY WITH PROPOFOL;  Surgeon: Charolett Bumpers, MD;  Location: [facility];  Service: Endoscopy;  Laterality: N/A;   dental implant  09/29/2018   HEART STENT     TUBAL LIGATION     Social History   Social History Narrative   From [place].   Divorced.   Lives alone.   Enjoys hiking and fishing.   2 children.  1 son in [place], 1 daughter in [place].      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  Commutes daily.      Are you right handed or left handed? Right Handed    Are you currently employed ? 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 Yes   Who lives with you?    What type of home do you live in: 1 story or 2 story? Lives in a one story home with one room upstairs        Immunization History  Administered Date(s) Administered   Fluad Quad(high Dose 65+) 10/07/2022   Influenza-Unspecified 08/08/2012, 08/10/2016, 07/09/2017, 07/09/2017, 08/29/2021   PFIZER(Purple Top)SARS-COV-2 Vaccination 01/29/2020, 02/19/2020, 10/10/2020   Pneumococcal Polysaccharide-23 04/08/2021   Tdap 11/08/2014   Zoster Recombinant(Shingrix) 08/30/2022     Objective: Vital Signs: BP 117/74 (BP Location: Left Arm, Patient Position: Sitting, Cuff Size: Normal)   Pulse 69   Resp 15   Ht 5\' 2"  (1.575 m)   Wt 170 lb 6.4 oz (77.3 kg)   BMI 31.17 kg/m    Physical Exam Vitals and nursing note reviewed.  Constitutional:      Appearance: She is well-developed.  HENT:     Head: Normocephalic and atraumatic.  Eyes:     Conjunctiva/sclera: Conjunctivae normal.  Cardiovascular:     Rate and Rhythm: Normal rate and regular rhythm.     Heart sounds: Normal heart sounds.  Pulmonary:      Effort: Pulmonary effort is normal.     Breath sounds: Normal breath sounds.  Abdominal:     General: Bowel sounds are normal.     Palpations: Abdomen is soft.  Musculoskeletal:     Cervical back: Normal range of motion.  Lymphadenopathy:     Cervical: No cervical adenopathy.  Skin:    General: Skin is warm and dry.     Capillary Refill: Capillary refill takes less than 2 seconds.  Neurological:     Mental Status: She is alert and oriented to person, place, and time.  Psychiatric:        Behavior: Behavior normal.      Musculoskeletal Exam: C-spine, thoracic spine, and lumbar spine good ROM.  Shoulder joints, elbow joints, wrist joints, MCPs, PIPs, and DIPs good ROM with no synovitis.  Hip  joints, knee joints,ankle joints have good ROM.  No warmth or effusion of knee joints.  Ankle joints have good ROM with no tenderness or swelling of ankle joints.  Bunion formation noted bilaterally.  PIP and DIP thickening consistent with OA of both feet.    CDAI Exam: CDAI Score: -- Patient Global: --; Provider Global: -- Swollen: --; Tender: -- Joint Exam 06/20/2023   No joint exam has been documented for this visit   There is currently no information documented on the homunculus. Go to the Rheumatology activity and complete the homunculus joint exam.  Investigation: No additional findings.  Imaging: No results found.  Recent Labs: Lab Results  Component Value Date   WBC 5.2 04/21/2023   HGB 12.9 04/21/2023   PLT 218 04/21/2023   NA 140 04/21/2023   K 4.1 04/21/2023   CL 103 04/21/2023   CO2 29 04/21/2023   GLUCOSE 143 (H) 04/21/2023   BUN 22 04/21/2023   CREATININE 0.92 04/21/2023   BILITOT 0.6 04/21/2023   ALKPHOS 52 04/21/2023   AST 10 04/21/2023   ALT 11 04/21/2023   PROT 6.6 04/21/2023   ALBUMIN 4.2 04/21/2023   CALCIUM 9.2 04/21/2023   GFRAA 68 05/05/2021   QFTBGOLDPLUS NEGATIVE 03/02/2021    Speciality Comments: PLQ eye exam: 09/08/2020 WNL [facility]  Follow up in 6 months. Abnormal OCT.  Started Plaquenil at age 76  Procedures:  No procedures performed Allergies: Aspirin, Epinephrine, and Hydrocodone       Assessment / Plan:     Visit Diagnoses: Autoimmune disease (HCC) - She has not had any signs or symptoms of a flare.  She has clinically been doing well taking Arava 10 mg 1 tablet by mouth daily.  She is tolerating Arava without any side effects and has not missed any doses recently.  She has no synovitis on examination today.  She has not had any recent rashes, Raynaud's phenomenon, sicca symptoms, oral or nasal ulcerations, or increased hair loss.  She experiences photosensitivity if she is outdoors for long periods of time.  Discussed the importance of avoiding direct sun exposure as well as wearing sun protective clothing and SPF 50 on a daily basis. Lab work from 12/29/22 was reviewed today: ANA 1:1280NH, no proteinuria, complements Wnl, ESR WNL, dsDNA negative, uric acid WNL.   ESR WNL on 04/21/23.  The following lab work will be obtained today for further evaluation.  She will remain on arava as prescribed.  A refill sent to the pharmacy today.  She was advised notify us if she develops signs or symptoms of a flare.  She will follow-up in the office in 5 months or sooner if needed. Plan: CBC with Differential/Platelet, COMPLETE METABOLIC PANEL WITH GFR, Protein / creatinine ratio, urine, Anti-DNA antibody, double-stranded, C3 and C4, Sedimentation rate, leflunomide (ARAVA) 10 MG tablet  High risk medication use -Arava 10 mg 1 tablet by mouth daily. CBC and CMP updated on 04/21/23.  Her next lab work will be due in November and every 3 months. No recent or recurrent infections. Discussed the importance of holding arava if she develops signs or symptoms of an infection and to resume once the infection has completely cleared.  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  No synovitis or dactylitis noted.  Complete fist formation bilaterally.  Discussed the  importance of joint protection and muscle strengthening.  Primary osteoarthritis of both knees: Good ROM of both knee joints with no warmth or effusion.   Primary osteoarthritis of both feet: Bunions noted overlying bilateral 1st MTP joints. PIP and DIP thickening consistent with OA of both feet.  No tenderness or synovitis noted.   Osteopenia of multiple sites:DEXA updated 10/18/22--osteopenia-T-score -1.6.  Statistically significant increase in BMD of AP spine and bilateral hips.  DEXA results were reviewed with the patient today in the office and all questions were addressed. Patient is taking a calcium vitamin D supplement daily.  No recent falls or fractures.  Discussed the importance of performing resistive exercises.  Plan to update DEXA in December 2025.  History of vitamin D deficiency: She is taking a vitamin D supplement daily.   Chronic idiopathic gout involving toe of right foot without tophus: She has not had any signs or symptoms of a gout flare.  She has clinically been doing well taking allopurinol 100 mg daily.  Uric acid was 3.4 on 12/29/2022.  Other medical conditions are listed as follows:  Dyslipidemia  Heart murmur  History of diabetes mellitus  History of hypothyroidism    Orders: Orders Placed This Encounter  Procedures   CBC with Differential/Platelet   COMPLETE METABOLIC PANEL WITH GFR   Protein / creatinine ratio, urine   Anti-DNA antibody, double-stranded   C3 and C4   Sedimentation rate   Meds ordered this encounter  Medications   leflunomide (ARAVA) 10 MG tablet    Sig: Take 1 tablet (10 mg total) by mouth daily.    Dispense:  90 tablet    Refill:  0      Follow-Up Instructions: Return in about 5 months (around  11/20/2023) for Autoimmune Disease.   Gearldine Bienenstock, PA-C  Note - This record has been created using Dragon software.  Chart creation errors have been sought, but may not always  have been located. Such creation errors do not reflect on  the standard of medical care.

## 2023-06-20 ENCOUNTER — Ambulatory Visit: Payer: BC Managed Care – PPO | Attending: Physician Assistant | Admitting: Physician Assistant

## 2023-06-20 ENCOUNTER — Encounter: Payer: Self-pay | Admitting: Physician Assistant

## 2023-06-20 VITALS — BP 117/74 | HR 69 | Resp 15 | Ht 62.0 in | Wt 170.4 lb

## 2023-06-20 DIAGNOSIS — R011 Cardiac murmur, unspecified: Secondary | ICD-10-CM

## 2023-06-20 DIAGNOSIS — T372X5A Adverse effect of antimalarials and drugs acting on other blood protozoa, initial encounter: Secondary | ICD-10-CM

## 2023-06-20 DIAGNOSIS — Z79899 Other long term (current) drug therapy: Secondary | ICD-10-CM

## 2023-06-20 DIAGNOSIS — M170 Bilateral primary osteoarthritis of knee: Secondary | ICD-10-CM

## 2023-06-20 DIAGNOSIS — M19071 Primary osteoarthritis, right ankle and foot: Secondary | ICD-10-CM

## 2023-06-20 DIAGNOSIS — H35389 Toxic maculopathy, unspecified eye: Secondary | ICD-10-CM

## 2023-06-20 DIAGNOSIS — M1A0710 Idiopathic chronic gout, right ankle and foot, without tophus (tophi): Secondary | ICD-10-CM

## 2023-06-20 DIAGNOSIS — M359 Systemic involvement of connective tissue, unspecified: Secondary | ICD-10-CM

## 2023-06-20 DIAGNOSIS — M19041 Primary osteoarthritis, right hand: Secondary | ICD-10-CM

## 2023-06-20 DIAGNOSIS — M8589 Other specified disorders of bone density and structure, multiple sites: Secondary | ICD-10-CM

## 2023-06-20 DIAGNOSIS — M19072 Primary osteoarthritis, left ankle and foot: Secondary | ICD-10-CM

## 2023-06-20 DIAGNOSIS — M19042 Primary osteoarthritis, left hand: Secondary | ICD-10-CM

## 2023-06-20 DIAGNOSIS — E785 Hyperlipidemia, unspecified: Secondary | ICD-10-CM

## 2023-06-20 DIAGNOSIS — Z8639 Personal history of other endocrine, nutritional and metabolic disease: Secondary | ICD-10-CM

## 2023-06-20 LAB — CBC WITH DIFFERENTIAL/PLATELET
Absolute Monocytes: 489 cells/uL (ref 200–950)
Basophils Absolute: 68 cells/uL (ref 0–200)
Basophils Relative: 1.3 %
Eosinophils Absolute: 478 cells/uL (ref 15–500)
Eosinophils Relative: 9.2 %
HCT: 41.8 % (ref 35.0–45.0)
Hemoglobin: 13.8 g/dL (ref 11.7–15.5)
Lymphs Abs: 1279 cells/uL (ref 850–3900)
MCH: 29.7 pg (ref 27.0–33.0)
MCHC: 33 g/dL (ref 32.0–36.0)
MCV: 90.1 fL (ref 80.0–100.0)
MPV: 12.3 fL (ref 7.5–12.5)
Monocytes Relative: 9.4 %
Neutro Abs: 2886 cells/uL (ref 1500–7800)
Neutrophils Relative %: 55.5 %
Platelets: 207 10*3/uL (ref 140–400)
RBC: 4.64 10*6/uL (ref 3.80–5.10)
RDW: 12.3 % (ref 11.0–15.0)
Total Lymphocyte: 24.6 %
WBC: 5.2 10*3/uL (ref 3.8–10.8)

## 2023-06-20 LAB — SEDIMENTATION RATE: Sed Rate: 9 mm/h (ref 0–30)

## 2023-06-20 MED ORDER — LEFLUNOMIDE 10 MG PO TABS
10.0000 mg | ORAL_TABLET | Freq: Every day | ORAL | 0 refills | Status: DC
Start: 2023-06-20 — End: 2023-09-19

## 2023-06-21 NOTE — Progress Notes (Signed)
dsDNA is negative

## 2023-06-21 NOTE — Progress Notes (Signed)
Complements WNL

## 2023-06-21 NOTE — Progress Notes (Signed)
No proteinuria

## 2023-06-21 NOTE — Progress Notes (Signed)
Glucose is 133. Rest of CMP WNL.  CBC WNL ESR WNL

## 2023-06-28 ENCOUNTER — Other Ambulatory Visit: Payer: Self-pay | Admitting: Cardiovascular Disease

## 2023-07-13 ENCOUNTER — Telehealth: Payer: Self-pay | Admitting: Family Medicine

## 2023-07-13 MED ORDER — LISINOPRIL 2.5 MG PO TABS: 2.5000 mg | ORAL_TABLET | Freq: Every day | ORAL | 3 refills | Status: DC

## 2023-07-13 NOTE — Telephone Encounter (Signed)
Rx is in EMR as historical; okay to refill?

## 2023-07-13 NOTE — Telephone Encounter (Signed)
Prescription Request  07/13/2023  LOV: 04/18/2023  What is the name of the medication or equipment? lisinopril (PRINIVIL,ZESTRIL) 2.5 MG tablet   Have you contacted your pharmacy to request a refill? Yes   Which pharmacy would you like this sent to?  [id number] - [place], [place] - [address] [place] [address] Phone: [phone] Fax: [phone]    Patient notified that their request is being sent to the clinical staff for review and that they should receive a response within 2 business days.   Please advise at [facility] [phone]

## 2023-07-13 NOTE — Addendum Note (Signed)
Addended by: Joaquim Nam on: 07/13/2023 02:55 PM   Modules accepted: Orders

## 2023-07-13 NOTE — Telephone Encounter (Signed)
Sent. Thanks.   

## 2023-08-11 ENCOUNTER — Other Ambulatory Visit: Payer: Self-pay | Admitting: Internal Medicine

## 2023-08-11 DIAGNOSIS — E1165 Type 2 diabetes mellitus with hyperglycemia: Secondary | ICD-10-CM

## 2023-08-22 ENCOUNTER — Other Ambulatory Visit: Payer: Self-pay | Admitting: Rheumatology

## 2023-08-22 NOTE — Telephone Encounter (Signed)
Last Fill: 05/24/2023  Labs: 06/20/2023 Glucose is 133. Rest of CMP WNL. CBC WNL  Next Visit: 11/21/2023  Last Visit: 06/20/2023  DX: Chronic idiopathic gout involving toe of right foot without tophus   Current Dose per office note 06/20/2023: allopurinol 100 mg daily   Okay to refill Allopurinol?

## 2023-09-19 ENCOUNTER — Other Ambulatory Visit: Payer: Self-pay | Admitting: Physician Assistant

## 2023-09-19 DIAGNOSIS — M359 Systemic involvement of connective tissue, unspecified: Secondary | ICD-10-CM

## 2023-09-19 NOTE — Telephone Encounter (Signed)
Last Fill: 06/10/2023  Labs: 06/20/2023 Glucose is 133. Rest of CMP WNL. CBC WNL  Next Visit: 11/29/2023  Last Visit: 06/20/2023  DX:  Autoimmune disease   Current Dose per office note 06/20/2023: Arava 10 mg 1 tablet by mouth daily.   Okay to refill Arava ?

## 2023-10-03 ENCOUNTER — Ambulatory Visit: Payer: BC Managed Care – PPO | Admitting: Internal Medicine

## 2023-10-03 ENCOUNTER — Encounter: Payer: Self-pay | Admitting: Internal Medicine

## 2023-10-03 VITALS — BP 122/80 | HR 67 | Ht 67.0 in | Wt 177.0 lb

## 2023-10-03 DIAGNOSIS — Z7984 Long term (current) use of oral hypoglycemic drugs: Secondary | ICD-10-CM | POA: Diagnosis not present

## 2023-10-03 DIAGNOSIS — Z7985 Long-term (current) use of injectable non-insulin antidiabetic drugs: Secondary | ICD-10-CM | POA: Diagnosis not present

## 2023-10-03 DIAGNOSIS — E1165 Type 2 diabetes mellitus with hyperglycemia: Secondary | ICD-10-CM | POA: Diagnosis not present

## 2023-10-03 LAB — POCT GLYCOSYLATED HEMOGLOBIN (HGB A1C): Hemoglobin A1C: 7.7 % — AB (ref 4.0–5.6)

## 2023-10-03 MED ORDER — SYNJARDY 12.5-1000 MG PO TABS: 1.0000 | ORAL_TABLET | Freq: Two times a day (BID) | ORAL | 3 refills | Status: DC

## 2023-10-03 MED ORDER — OZEMPIC (1 MG/DOSE) 4 MG/3ML ~~LOC~~ SOPN: 1.0000 mg | PEN_INJECTOR | SUBCUTANEOUS | 2 refills | Status: DC

## 2023-10-03 MED ORDER — GLIMEPIRIDE 1 MG PO TABS: 1.0000 mg | ORAL_TABLET | Freq: Every day | ORAL | 3 refills | Status: DC

## 2023-10-03 NOTE — Patient Instructions (Signed)
-   Take Glimepiride 1 mg , 1 tablet before Breakfast  - Continue  Synjardy 12.03-999 mg, TWO tablets daily  - Continue  Ozempic 1 mg weekly    HOW TO TREAT LOW BLOOD SUGARS (Blood sugar LESS THAN 70 MG/DL) Please follow the RULE OF 15 for the treatment of hypoglycemia treatment (when your (blood sugars are less than 70 mg/dL)   STEP 1: Take 15 grams of carbohydrates when your blood sugar is low, which includes:  3-4 GLUCOSE TABS  OR 3-4 OZ OF JUICE OR REGULAR SODA OR ONE TUBE OF GLUCOSE GEL    STEP 2: RECHECK blood sugar in 15 MINUTES STEP 3: If your blood sugar is still low at the 15 minute recheck --> then, go back to STEP 1 and treat AGAIN with another 15 grams of carbohydrates.

## 2023-10-03 NOTE — Progress Notes (Signed)
Name: Susan Daniels  MRN/ DOB: [id number], May 11, 1956   Age/ Sex: 67 y.o., female    PCP: Joaquim Nam, MD   Reason for Endocrinology Evaluation: Type 2 Diabetes Mellitus     Date of Initial Endocrinology Visit: 02/03/2022    PATIENT IDENTIFIER: Susan Daniels is a 67 y.o. female with a past medical history of T2DM, autoimmune disease. The patient presented for initial endocrinology clinic visit on 02/03/2022 for consultative assistance with her diabetes management.    HPI: [name] was    Diagnosed with DM ~ 10 yrs  Prior Medications tried/Intolerance: Was on insulin . 2020 after discharge for MI . Started Ozempic 2021          Hemoglobin A1c  ranging from 8.0 %, peaking at 9.8% in 2023.   She follows with Dr. Mariah Milling ( cardiology)  She has noted abdominal pain with the urge to vomit  the day following ozempic , its short lived  Glimepiride started 04/2023 with an A1c of 8.0%  SUBJECTIVE:   During the last visit (04/11/2023): A1c 8.0 %   Today (10/03/23): [name] is here for a follow up on diabetes management   She checks checks her blood sugars 1 times daily. The patient has not had hypoglycemic episodes since the last clinic visit.   Patient follows with rheumatology for autoimmune disease and osteoarthritis  She follows with cardiology She was evaluated by neurology for facial paresthesia, MRI unrevealing  Denies nausea or vomiting  Denies constipation or diarrhea   She self discontinued Ozempic for a month in October to see if that would make a difference, she has noted hypoglycemia as well as weight gain   HOME DIABETES REGIMEN: Glimepiride 1 mg daily Synjardy 12.03-999 mg , BID  Ozempic 1 mg weekly      Statin: yes ACE-I/ARB: yes   METER DOWNLOAD SUMMARY: 10/27-11/25/2024 Fingerstick Blood Glucose Tests = 13 Average Number Tests/Day = 0 Overall Mean FS Glucose = 142 Standard Deviation = 14  BG Ranges: Low = 111 High =  160  BG Target % Results: % In target = 100 % Over target = 0 % Under target = 0  Hypoglycemic Events/30 Days: BG < 50 = 0 Episodes of symptomatic severe hypoglycemia = 0    DIABETIC COMPLICATIONS: Microvascular complications:   Denies: CKD, retinopathy  Last eye exam: Completed 2023  Macrovascular complications:  CAD Denies: PVD, CVA   PAST HISTORY: Past Medical History:  Past Medical History:  Diagnosis Date   Arthritis    Rheumatoid   CAD (coronary artery disease)    s/p stent x1   Diabetes mellitus without complication (HCC)    Headache(784.0)    migraines   Heart murmur    Past Surgical History:  Past Surgical History:  Procedure Laterality Date   COLONOSCOPY WITH PROPOFOL N/A 01/30/2013   Procedure: COLONOSCOPY WITH PROPOFOL;  Surgeon: Charolett Bumpers, MD;  Location: [facility];  Service: Endoscopy;  Laterality: N/A;   dental implant  09/29/2018   HEART STENT     TUBAL LIGATION      Social History:  reports that she quit smoking about 43 years ago. Her smoking use included cigarettes. She started smoking about 50 years ago. She has a 3.5 pack-year smoking history. She has never been exposed to tobacco smoke. She has never used smokeless tobacco. She reports current alcohol use. She reports that she does not use drugs. Family History:  Family History  Problem Relation Age  of Onset   Healthy Mother    Heart disease Father    Rheumatic fever Father    Hypertension Sister    Rheumatic fever Sister    Breast cancer Maternal Aunt    Dementia Maternal Uncle    Rheumatic fever Maternal Grandmother    Heart Problems Maternal Grandmother    Healthy Daughter    Healthy Son    Colon cancer Neg Hx      HOME MEDICATIONS: Allergies as of 10/03/2023       Reactions   Aspirin    Can not take high doses of aspirin- h/o heart racing with use    Epinephrine    Hydrocodone Nausea And Vomiting        Medication List        Accurate as of October 03, 2023  8:15 AM. If you have any questions, ask your nurse or doctor.          allopurinol 100 MG tablet Commonly known as: ZYLOPRIM TAKE 1 TABLET BY MOUTH EVERY DAY   aspirin 81 MG chewable tablet Chew 81 mg by mouth daily.   B-D ULTRA-FINE 33 LANCETS Misc Use as directed to test blood glucose   OneTouch Delica Lancets 33G Misc CHECK BLOOD SUGAR TWICE A DAY DX E11.65 FINGERSTICK 90 DAYS   CALCIUM PO Take by mouth daily.   doxycycline 100 MG tablet Commonly known as: VIBRA-TABS Take 1 tablet (100 mg total) by mouth 2 (two) times daily.   glimepiride 1 MG tablet Commonly known as: AMARYL Take 1 tablet (1 mg total) by mouth daily with breakfast.   GlucoCom Blood Glucose Monitor Devi Use for glucose testing as directed   leflunomide 10 MG tablet Commonly known as: ARAVA TAKE 1 TABLET BY MOUTH EVERY DAY   lisinopril 2.5 MG tablet Commonly known as: ZESTRIL Take 1 tablet (2.5 mg total) by mouth daily.   metoprolol tartrate 25 MG tablet Commonly known as: LOPRESSOR TAKE 1/2 TABLET TWICE A DAY BY MOUTH   nitroGLYCERIN 0.4 MG SL tablet Commonly known as: NITROSTAT Place 1 tablet (0.4 mg total) under the tongue as needed.   OneTouch Verio test strip Generic drug: glucose blood CHECK BLOOD SUGAR TWICE A DAY PRIOR TO MEAL   Ozempic (1 MG/DOSE) 4 MG/3ML Sopn Generic drug: Semaglutide (1 MG/DOSE) INJECT 1 MG ONCE A WEEK AS DIRECTED   rosuvastatin 10 MG tablet Commonly known as: CRESTOR Take 5 mg by mouth at bedtime. 1/2 tablet once a day   Synjardy 12.03-999 MG Tabs Generic drug: Empagliflozin-metFORMIN HCl Take 1 tablet by mouth 2 (two) times daily.   VITAMIN D PO Take by mouth daily.         ALLERGIES: Allergies  Allergen Reactions   Aspirin     Can not take high doses of aspirin- h/o heart racing with use    Epinephrine    Hydrocodone Nausea And Vomiting     REVIEW OF SYSTEMS: A comprehensive ROS was conducted with the patient and is negative  except as per HPI    OBJECTIVE:   VITAL SIGNS: BP 122/80 (BP Location: Left Arm, Patient Position: Sitting, Cuff Size: Large)   Pulse 67   Ht 5\' 7"  (1.702 m)   Wt 177 lb (80.3 kg)   SpO2 99%   BMI 27.72 kg/m    PHYSICAL EXAM:  General: Pt appears well and is in NAD  Neck: General: Supple without adenopathy or carotid bruits. Thyroid: Thyroid size normal.  No goiter or  nodules appreciated.  Lungs: Clear with good BS bilat with no rales, rhonchi, or wheezes  Heart: RRR , + systolic murmur   Extremities:  Lower extremities - No pretibial edema  Neuro: MS is good with appropriate affect, pt is alert and Ox3    DM foot exam: 04/11/2023  The skin of the feet is intact without sores or ulcerations. The pedal pulses are 2+ on right and 2+ on left. The sensation is intact to a screening 5.07, 10 gram monofilament bilaterally    DATA REVIEWED:  Lab Results  Component Value Date   HGBA1C 7.7 (A) 10/03/2023   HGBA1C 8.0 (A) 04/11/2023   HGBA1C 8.2 (A) 10/07/2022     Latest Reference Range & Units 06/20/23 08:19  Sodium 135 - 146 mmol/L 141  Potassium 3.5 - 5.3 mmol/L 4.6  Chloride 98 - 110 mmol/L 103  CO2 20 - 32 mmol/L 29  Glucose 65 - 99 mg/dL 478 (H)  BUN 7 - 25 mg/dL 22  Creatinine 2.95 - 6.21 mg/dL 3.08  Calcium 8.6 - 65.7 mg/dL 9.5  BUN/Creatinine Ratio 6 - 22 (calc) SEE NOTE:  eGFR > OR = 60 mL/min/1.65m2 71  AG Ratio 1.0 - 2.5 (calc) 1.9  AST 10 - 35 U/L 12  ALT 6 - 29 U/L 11  Total Protein 6.1 - 8.1 g/dL 6.7  Total Bilirubin 0.2 - 1.2 mg/dL 0.4  Alkaline phosphatase (APISO) 37 - 153 U/L 58     Latest Reference Range & Units 06/20/23 08:19  Total Protein, Urine 5 - 24 mg/dL <4 (L)  Creatinine, Urine 20 - 275 mg/dL 30  Protein/Creat Ratio 24 - 184 mg/g creat NOTE     ASSESSMENT / PLAN / RECOMMENDATIONS:   1) Type 2 Diabetes Mellitus,Sub-optimally  controlled, With Macrovascular  complications - Most recent A1c of 7.7 %. Goal A1c < 7.0 %.    - A1c  trending down but remains above goal  -She has self discontinued Ozempic for a month during  October, has noted hypoglycemia weight gain, and has been back on it beginning of November -She has been taking glimepiride after the meal, emphasized the importance of taking this 15-20 minutes before breakfast -We entertained  the idea of increasing Ozempic, but she would like to remain on the current dose    MEDICATIONS: Continue Synjardy 12.03-999 mg, 2 tabs daily Continue  Ozempic 1 mg weekly Take Glimepiride 1 mg, 1 tablet before Breakfast   EDUCATION / INSTRUCTIONS: BG monitoring instructions: Patient is instructed to check her blood sugars 1 times a day Call [facility] if: BG persistently < 70 I reviewed the Rule of 15 for the treatment of hypoglycemia in detail with the patient. Literature supplied.   2) Diabetic complications:  Eye: Does not have known diabetic retinopathy.  Neuro/ Feet: Does not have known diabetic peripheral neuropathy. Renal: Patient does not have known baseline CKD. She is  on an ACEI/ARB at present.   3) CAD/Dyslipidemia:   - Per cardiology  F/U in 6 months   Signed electronically by: Lyndle Herrlich, MD  [facility]  [facility] [address] [place] [place], [place] [address] Phone: [phone] FAX: [phone]   CC: Joaquim Nam, MD [address] [place] [address] Phone: [phone]  Fax: [phone]    Return to Endocrinology clinic as below: Future Appointments  Date Time Provider Department Center  11/29/2023  7:50 AM Gearldine Bienenstock, PA-C [facility] None

## 2023-10-24 LAB — HM MAMMOGRAPHY

## 2023-10-25 ENCOUNTER — Encounter: Payer: Self-pay | Admitting: Family Medicine

## 2023-11-15 NOTE — Progress Notes (Deleted)
Office Visit Note  Patient: Susan Daniels             Date of Birth: 1956-05-10           MRN: [id number]             PCP: Joaquim Nam, MD Referring: Joaquim Nam, MD Visit Date: 11/29/2023 Occupation: @GUAROCC @  Subjective:    History of Present Illness: NIKO AUMENT is a 68 y.o. female with history of autoimmune disease and osteoarthritis.  Patient remains on arava 10 mg 1 tablet by mouth daily.    CBC and CMP updated on 06/20/23.  Orders for CBC and CMP released today.   Discussed the importance of holding arava if she develops signs or symptoms of an infection and to resume once the infection has completely cleared.   Lab work from 06/20/23 was reviewed today in the office: no proteinuria, ESR WNL, complements WNL, and dsDNA is negative.  The following lab work will be updated today.   Activities of Daily Living:  Patient reports morning stiffness for *** {minute/hour:19697}.   Patient {ACTIONS;DENIES/REPORTS:21021675::"Denies"} nocturnal pain.  Difficulty dressing/grooming: {ACTIONS;DENIES/REPORTS:21021675::"Denies"} Difficulty climbing stairs: {ACTIONS;DENIES/REPORTS:21021675::"Denies"} Difficulty getting out of chair: {ACTIONS;DENIES/REPORTS:21021675::"Denies"} Difficulty using hands for taps, buttons, cutlery, and/or writing: {ACTIONS;DENIES/REPORTS:21021675::"Denies"}  No Rheumatology ROS completed.   PMFS History:  Patient Active Problem List   Diagnosis Date Noted   Abscess of left groin 04/21/2023   Facial paresthesia 04/19/2023   Vertigo 04/19/2023   Lymphadenopathy 04/19/2023   Gout 03/30/2023   Healthcare maintenance 03/30/2023   Advance care planning 03/30/2023   Type 2 diabetes mellitus with hyperglycemia, without long-term current use of insulin (HCC) 05/19/2022   CAD (coronary artery disease), native coronary artery 06/16/2020   Autoimmune disease (HCC) 06/03/2017   High risk medication use 06/03/2017   Primary osteoarthritis of both  hands 06/03/2017   Primary osteoarthritis of both knees 06/03/2017   Primary osteoarthritis of both feet 06/03/2017   Heart murmur 06/03/2017   History of hypothyroidism 06/03/2017   Dyslipidemia 06/03/2017   Vitamin D deficiency 06/03/2017    Past Medical History:  Diagnosis Date   Arthritis    Rheumatoid   CAD (coronary artery disease)    s/p stent x1   Diabetes mellitus without complication (HCC)    Headache(784.0)    migraines   Heart murmur     Family History  Problem Relation Age of Onset   Healthy Mother    Heart disease Father    Rheumatic fever Father    Hypertension Sister    Rheumatic fever Sister    Breast cancer Maternal Aunt    Dementia Maternal Uncle    Rheumatic fever Maternal Grandmother    Heart Problems Maternal Grandmother    Healthy Daughter    Healthy Son    Colon cancer Neg Hx    Past Surgical History:  Procedure Laterality Date   COLONOSCOPY WITH PROPOFOL N/A 01/30/2013   Procedure: COLONOSCOPY WITH PROPOFOL;  Surgeon: Charolett Bumpers, MD;  Location: [facility];  Service: Endoscopy;  Laterality: N/A;   dental implant  09/29/2018   HEART STENT     TUBAL LIGATION     Social History   Social History Narrative   From [place].   Divorced.   Lives alone.   Enjoys hiking and fishing.   2 children.  1 son in [place] city, 1 daughter in [place].      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      Are you right handed or left handed? Right Handed    Are you currently employed ? 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 Yes   Who lives with you?    What type of home do you live in: 1 story or 2 story? Lives in a one story home with one room upstairs        Immunization History  Administered Date(s) Administered   Fluad Quad(high Dose 65+) 10/07/2022   Influenza-Unspecified 08/08/2012, 08/10/2016, 07/09/2017, 07/09/2017, 08/29/2021   PFIZER(Purple  Top)SARS-COV-2 Vaccination 01/29/2020, 02/19/2020, 10/10/2020   Pneumococcal Polysaccharide-23 04/08/2021   Tdap 11/08/2014   Zoster Recombinant(Shingrix) 08/30/2022     Objective: Vital Signs: There were no vitals taken for this visit.   Physical Exam Vitals and nursing note reviewed.  Constitutional:      Appearance: She is well-developed.  HENT:     Head: Normocephalic and atraumatic.  Eyes:     Conjunctiva/sclera: Conjunctivae normal.  Cardiovascular:     Rate and Rhythm: Normal rate and regular rhythm.     Heart sounds: Normal heart sounds.  Pulmonary:     Effort: Pulmonary effort is normal.     Breath sounds: Normal breath sounds.  Abdominal:     General: Bowel sounds are normal.     Palpations: Abdomen is soft.  Musculoskeletal:     Cervical back: Normal range of motion.  Lymphadenopathy:     Cervical: No cervical adenopathy.  Skin:    General: Skin is warm and dry.     Capillary Refill: Capillary refill takes less than 2 seconds.  Neurological:     Mental Status: She is alert and oriented to person, place, and time.  Psychiatric:        Behavior: Behavior normal.      Musculoskeletal Exam: ***  CDAI Exam: CDAI Score: -- Patient Global: --; Provider Global: -- Swollen: --; Tender: -- Joint Exam 11/29/2023   No joint exam has been documented for this visit   There is currently no information documented on the homunculus. Go to the Rheumatology activity and complete the homunculus joint exam.  Investigation: No additional findings.  Imaging: No results found.  Recent Labs: Lab Results  Component Value Date   WBC 5.2 06/20/2023   HGB 13.8 06/20/2023   PLT 207 06/20/2023   NA 141 06/20/2023   K 4.6 06/20/2023   CL 103 06/20/2023   CO2 29 06/20/2023   GLUCOSE 133 (H) 06/20/2023   BUN 22 06/20/2023   CREATININE 0.89 06/20/2023   BILITOT 0.4 06/20/2023   ALKPHOS 52 04/21/2023   AST 12 06/20/2023   ALT 11 06/20/2023   PROT 6.7 06/20/2023    ALBUMIN 4.2 04/21/2023   CALCIUM 9.5 06/20/2023   GFRAA 68 05/05/2021   QFTBGOLDPLUS NEGATIVE 03/02/2021    Speciality Comments: PLQ eye exam: 09/08/2020 WNL [facility] Follow up in 6 months. Abnormal OCT.  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  No orders of the defined types were placed in this encounter.   Face-to-face time spent with patient was *** minutes. Greater than 50% of time was spent in counseling and coordination of care.  Follow-Up Instructions: No follow-ups on file.   Gearldine Bienenstock, PA-C  Note - This record has been created using Dragon software.  Chart creation errors have been sought, but may not always  have been located. Such creation errors do not reflect on  the standard of medical care.

## 2023-11-21 ENCOUNTER — Ambulatory Visit: Payer: BC Managed Care – PPO | Admitting: Physician Assistant

## 2023-11-24 ENCOUNTER — Other Ambulatory Visit: Payer: Self-pay | Admitting: Physician Assistant

## 2023-11-24 NOTE — Telephone Encounter (Signed)
Last Fill: 08/22/2023  Labs: 06/20/2023 Glucose is 133. Rest of CMP WNL. CBC WNL  Next Visit: 11/29/2023  Last Visit: 06/20/2023  DX: Chronic idiopathic gout involving toe of right foot without tophus   Current Dose per office note 06/20/2023: allopurinol 100 mg daily   Okay to refill Allopurinol?

## 2023-11-29 ENCOUNTER — Ambulatory Visit: Payer: BC Managed Care – PPO | Admitting: Physician Assistant

## 2023-11-29 DIAGNOSIS — R011 Cardiac murmur, unspecified: Secondary | ICD-10-CM

## 2023-11-29 DIAGNOSIS — M359 Systemic involvement of connective tissue, unspecified: Secondary | ICD-10-CM

## 2023-11-29 DIAGNOSIS — Z8639 Personal history of other endocrine, nutritional and metabolic disease: Secondary | ICD-10-CM

## 2023-11-29 DIAGNOSIS — M19041 Primary osteoarthritis, right hand: Secondary | ICD-10-CM

## 2023-11-29 DIAGNOSIS — H35389 Toxic maculopathy, unspecified eye: Secondary | ICD-10-CM

## 2023-11-29 DIAGNOSIS — M170 Bilateral primary osteoarthritis of knee: Secondary | ICD-10-CM

## 2023-11-29 DIAGNOSIS — M8589 Other specified disorders of bone density and structure, multiple sites: Secondary | ICD-10-CM

## 2023-11-29 DIAGNOSIS — M19071 Primary osteoarthritis, right ankle and foot: Secondary | ICD-10-CM

## 2023-11-29 DIAGNOSIS — M1A0710 Idiopathic chronic gout, right ankle and foot, without tophus (tophi): Secondary | ICD-10-CM

## 2023-11-29 DIAGNOSIS — Z79899 Other long term (current) drug therapy: Secondary | ICD-10-CM

## 2023-11-29 DIAGNOSIS — E785 Hyperlipidemia, unspecified: Secondary | ICD-10-CM

## 2023-12-01 NOTE — Progress Notes (Signed)
Office Visit Note  Patient: Susan Daniels             Date of Birth: 06-29-1956           MRN: [id number]             PCP: Joaquim Nam, MD Referring: Joaquim Nam, MD Visit Date: 12/08/2023 Occupation: @GUAROCC @  Subjective:  Medication monitoring   History of Present Illness: Susan Daniels is a 68 y.o. female with history of autoimmune disease and osteoarthritis.  Patient remains on arava 10 mg 1 tablet by mouth daily.  She is tolerating Arava without any side effects and has not had any recent gaps in therapy.  She denies any signs or symptoms of an autoimmune disease flare.  She has not had any recent rashes, symptoms of Raynaud's phenomenon, oral or nasal ulcerations, sicca symptoms, swollen lymph nodes, or hair loss.  She continues to have photosensitivity and avoids direct sun exposure.  Patient states she is been experiencing some discomfort below her left buttocks especially if climbing steps or sitting for prolonged periods of time.  Patient started the symptoms started 2 to 3 months ago with no identifiable trigger or injury.  Patient states that the discomfort has subsided but has not resolved.  She denies any other joint pain or joint swelling at this time. She denies any recent infections.  She denies any new medical conditions.   Activities of Daily Living:  Patient reports morning stiffness for 0 minutes.   Patient Denies nocturnal pain.  Difficulty dressing/grooming: Denies Difficulty climbing stairs: Reports Difficulty getting out of chair: Denies Difficulty using hands for taps, buttons, cutlery, and/or writing: Denies  Review of Systems  Constitutional:  Negative for fatigue.  HENT:  Negative for mouth sores, mouth dryness and nose dryness.   Eyes:  Negative for pain and dryness.  Respiratory:  Negative for shortness of breath and difficulty breathing.   Cardiovascular:  Negative for chest pain and palpitations.  Gastrointestinal:  Negative for  blood in stool, constipation and diarrhea.  Endocrine: Negative for increased urination.  Genitourinary:  Negative for involuntary urination.  Musculoskeletal:  Positive for joint pain, joint pain, myalgias and myalgias. Negative for gait problem, joint swelling, muscle weakness, morning stiffness and muscle tenderness.  Skin:  Positive for sensitivity to sunlight. Negative for color change, rash and hair loss.  Allergic/Immunologic: Negative for susceptible to infections.  Neurological:  Negative for dizziness and headaches.  Hematological:  Negative for swollen glands.  Psychiatric/Behavioral:  Negative for depressed mood and sleep disturbance. The patient is not nervous/anxious.     PMFS History:  Patient Active Problem List   Diagnosis Date Noted   Abscess of left groin 04/21/2023   Facial paresthesia 04/19/2023   Vertigo 04/19/2023   Lymphadenopathy 04/19/2023   Gout 03/30/2023   Healthcare maintenance 03/30/2023   Advance care planning 03/30/2023   Type 2 diabetes mellitus with hyperglycemia, without long-term current use of insulin (HCC) 05/19/2022   CAD (coronary artery disease), native coronary artery 06/16/2020   Autoimmune disease (HCC) 06/03/2017   High risk medication use 06/03/2017   Primary osteoarthritis of both hands 06/03/2017   Primary osteoarthritis of both knees 06/03/2017   Primary osteoarthritis of both feet 06/03/2017   Heart murmur 06/03/2017   History of hypothyroidism 06/03/2017   Dyslipidemia 06/03/2017   Vitamin D deficiency 06/03/2017    Past Medical History:  Diagnosis Date   Arthritis    Rheumatoid   CAD (coronary artery  disease)    s/p stent x1   Diabetes mellitus without complication (HCC)    Headache(784.0)    migraines   Heart murmur     Family History  Problem Relation Age of Onset   Healthy Mother    Heart disease Father    Rheumatic fever Father    Hypertension Sister    Rheumatic fever Sister    Breast cancer Maternal Aunt     Dementia Maternal Uncle    Rheumatic fever Maternal Grandmother    Heart Problems Maternal Grandmother    Healthy Daughter    Healthy Son    Colon cancer Neg Hx    Past Surgical History:  Procedure Laterality Date   COLONOSCOPY WITH PROPOFOL N/A 01/30/2013   Procedure: COLONOSCOPY WITH PROPOFOL;  Surgeon: Charolett Bumpers, MD;  Location: [facility];  Service: Endoscopy;  Laterality: N/A;   dental implant  09/29/2018   HEART STENT     TUBAL LIGATION     Social History   Social History Narrative   From [place].   Divorced.   Lives alone.   Enjoys hiking and fishing.   2 children.  1 son in [place] city, 1 daughter in [place].      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      Are you right handed or left handed? Right Handed    Are you currently employed ? 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 Yes   Who lives with you?    What type of home do you live in: 1 story or 2 story? Lives in a one story home with one room upstairs        Immunization History  Administered Date(s) Administered   Fluad Quad(high Dose 65+) 10/07/2022   Influenza-Unspecified 08/08/2012, 08/10/2016, 07/09/2017, 07/09/2017, 08/29/2021   PFIZER(Purple Top)SARS-COV-2 Vaccination 01/29/2020, 02/19/2020, 10/10/2020   Pneumococcal Polysaccharide-23 04/08/2021   Tdap 11/08/2014   Zoster Recombinant(Shingrix) 08/30/2022     Objective: Vital Signs: BP 122/76 (BP Location: Left Arm, Patient Position: Sitting, Cuff Size: Normal)   Pulse 74   Resp 16   Ht 5\' 2"  (1.575 m)   Wt 176 lb (79.8 kg)   BMI 32.19 kg/m    Physical Exam Vitals and nursing note reviewed.  Constitutional:      Appearance: She is well-developed.  HENT:     Head: Normocephalic and atraumatic.  Eyes:     Conjunctiva/sclera: Conjunctivae normal.  Cardiovascular:     Rate and Rhythm: Normal rate and regular rhythm.     Heart sounds: Normal  heart sounds.  Pulmonary:     Effort: Pulmonary effort is normal.     Breath sounds: Normal breath sounds.  Abdominal:     General: Bowel sounds are normal.     Palpations: Abdomen is soft.  Musculoskeletal:     Cervical back: Normal range of motion.  Lymphadenopathy:     Cervical: No cervical adenopathy.  Skin:    General: Skin is warm and dry.     Capillary Refill: Capillary refill takes less than 2 seconds.  Neurological:     Mental Status: She is alert and oriented to person, place, and time.  Psychiatric:        Behavior: Behavior normal.      Musculoskeletal Exam: C-spine, thoracic spine, lumbar spine have good range of motion.  Shoulder joints, elbow joints, wrist joints, MCPs, PIPs, DIPs have good  range of motion with no synovitis.  Hip joints, knee joints, and ankle joints have good range of motion.  No warmth or effusion of knee joints noted.  No tenderness or swelling of ankle joints.  CDAI Exam: CDAI Score: -- Patient Global: --; Provider Global: -- Swollen: --; Tender: -- Joint Exam 12/08/2023   No joint exam has been documented for this visit   There is currently no information documented on the homunculus. Go to the Rheumatology activity and complete the homunculus joint exam.  Investigation: No additional findings.  Imaging: No results found.  Recent Labs: Lab Results  Component Value Date   WBC 5.2 06/20/2023   HGB 13.8 06/20/2023   PLT 207 06/20/2023   NA 141 06/20/2023   K 4.6 06/20/2023   CL 103 06/20/2023   CO2 29 06/20/2023   GLUCOSE 133 (H) 06/20/2023   BUN 22 06/20/2023   CREATININE 0.89 06/20/2023   BILITOT 0.4 06/20/2023   ALKPHOS 52 04/21/2023   AST 12 06/20/2023   ALT 11 06/20/2023   PROT 6.7 06/20/2023   ALBUMIN 4.2 04/21/2023   CALCIUM 9.5 06/20/2023   GFRAA 68 05/05/2021   QFTBGOLDPLUS NEGATIVE 03/02/2021    Speciality Comments: PLQ eye exam: 09/08/2020 WNL [facility] Follow up in 6 months. Abnormal OCT.  Started  Plaquenil at age 25  Procedures:  No procedures performed Allergies: Aspirin, Epinephrine, and Hydrocodone     Assessment / Plan:     Visit Diagnoses: Autoimmune disease (HCC) - She has not had any signs or symptoms of an autoimmune disease flare.  She has clinically been doing well taking Arava 10 mg 1 tablet by mouth daily.  She is tolerating Arava without any side effects and has not had any recent gaps in therapy.  She has no synovitis on examination today.  She has not had any recent rashes, hair loss, Raynaud's phenomenon, oral or nasal ulcerations, sicca symptoms, or cervical lymphadenopathy.  Overall her energy level has been stable. Lab work from 06/20/23 was reviewed today in the office: no proteinuria, ESR WNL, complements WNL, and dsDNA is negative.  The following lab work will be updated today.  Patient will remain on Arava as prescribed.  She was vies notify us if she develops signs or symptoms of a flare.  She will follow-up in the office in 5 months or sooner if needed. Plan: CBC with Differential/Platelet, Protein / creatinine ratio, urine, COMPLETE METABOLIC PANEL WITH GFR, Anti-DNA antibody, double-stranded, C3 and C4, Sedimentation rate  High risk medication use - Arava 10 mg 1 tablet by mouth daily.  CBC and CMP updated on 06/20/23.  Orders for CBC and CMP released today.  Her next lab work will be due at the end of April and every 3 months to monitor for drug toxicity. No recent or recurrent infections. Discussed the importance of holding arava if she develops signs or symptoms of an infection and to resume once the infection has completely cleared.  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  No synovitis was noted on examination today.  Primary osteoarthritis of both knees: She  has good range of motion of both knees with some discomfort in her right knee.  No warmth or effusion noted. Underwent right knee viscosupplementation in January/early February 2020.  Primary osteoarthritis of both feet: She is not experiencing  any increased discomfort in her feet at this time.  Osteopenia of multiple sites: DEXA updated 10/18/22--osteopenia-T-score -1.6.  Statistically significant increase in BMD of AP spine and bilateral hips.  DEXA results were reviewed with the patient today in the office and all questions were addressed. Patient is taking a calcium and vitamin D supplement daily.  No recent falls or fractures.  Discussed the importance of performing resistive exercises.  Plan to update DEXA in December 2025.  History of vitamin D deficiency: She is taking a vitamin D supplement daily.  Chronic idiopathic gout involving toe of right foot without tophus -She has not had any signs or symptoms of a gout flare.  She has clinically been doing well taking allopurinol 100 mg daily.  She continues to tolerate allopurinol without any side effects and has not had any recent gaps in therapy.  Plan to update uric acid level today.  Plan: Uric acid  Ischial bursitis of left side: Patient reports for the past 2 to 3 months she has been experiencing discomfort inferior to her left buttocks intermittently.  Her symptoms are exacerbated by sitting for prolonged periods of time or while climbing steps.  Her symptoms have gradually started to improve.  On examination today her symptoms are not currently active but seem consistent with possible left sided ischial bursitis.  Discussed stretching exercises that she can perform.  .  Discussed that we can place a referral to orthopedics or physical therapy if her symptoms persist or worsen.  Other medical conditions are listed as follows:  Dyslipidemia  Heart murmur  History of diabetes mellitus  History of hypothyroidism  Orders: Orders Placed  This Encounter  Procedures   CBC with Differential/Platelet   Protein / creatinine ratio, urine   COMPLETE METABOLIC PANEL WITH GFR   Anti-DNA antibody, double-stranded   C3 and C4   Sedimentation rate   Uric acid   No orders of the defined types were placed in this encounter.    Follow-Up Instructions: Return in about 5 months (around 05/07/2024) for Autoimmune Disease.   Gearldine Bienenstock, PA-C  Note - This record has been created using Dragon software.  Chart creation errors have been sought, but may not always  have been located. Such creation errors do not reflect on  the standard of medical care.

## 2023-12-08 ENCOUNTER — Ambulatory Visit: Payer: BC Managed Care – PPO | Attending: Physician Assistant | Admitting: Physician Assistant

## 2023-12-08 ENCOUNTER — Other Ambulatory Visit: Payer: Self-pay | Admitting: Family Medicine

## 2023-12-08 ENCOUNTER — Encounter: Payer: Self-pay | Admitting: Physician Assistant

## 2023-12-08 VITALS — BP 122/76 | HR 74 | Resp 16 | Ht 62.0 in | Wt 176.0 lb

## 2023-12-08 DIAGNOSIS — M170 Bilateral primary osteoarthritis of knee: Secondary | ICD-10-CM

## 2023-12-08 DIAGNOSIS — M19042 Primary osteoarthritis, left hand: Secondary | ICD-10-CM

## 2023-12-08 DIAGNOSIS — M8589 Other specified disorders of bone density and structure, multiple sites: Secondary | ICD-10-CM

## 2023-12-08 DIAGNOSIS — M19041 Primary osteoarthritis, right hand: Secondary | ICD-10-CM | POA: Diagnosis not present

## 2023-12-08 DIAGNOSIS — M359 Systemic involvement of connective tissue, unspecified: Secondary | ICD-10-CM

## 2023-12-08 DIAGNOSIS — H35389 Toxic maculopathy, unspecified eye: Secondary | ICD-10-CM | POA: Diagnosis not present

## 2023-12-08 DIAGNOSIS — Z79899 Other long term (current) drug therapy: Secondary | ICD-10-CM

## 2023-12-08 DIAGNOSIS — Z8639 Personal history of other endocrine, nutritional and metabolic disease: Secondary | ICD-10-CM

## 2023-12-08 DIAGNOSIS — M19072 Primary osteoarthritis, left ankle and foot: Secondary | ICD-10-CM

## 2023-12-08 DIAGNOSIS — T372X5A Adverse effect of antimalarials and drugs acting on other blood protozoa, initial encounter: Secondary | ICD-10-CM

## 2023-12-08 DIAGNOSIS — R011 Cardiac murmur, unspecified: Secondary | ICD-10-CM

## 2023-12-08 DIAGNOSIS — M7072 Other bursitis of hip, left hip: Secondary | ICD-10-CM

## 2023-12-08 DIAGNOSIS — M19071 Primary osteoarthritis, right ankle and foot: Secondary | ICD-10-CM

## 2023-12-08 DIAGNOSIS — M1A0710 Idiopathic chronic gout, right ankle and foot, without tophus (tophi): Secondary | ICD-10-CM

## 2023-12-08 DIAGNOSIS — E785 Hyperlipidemia, unspecified: Secondary | ICD-10-CM

## 2023-12-08 NOTE — Patient Instructions (Signed)
Standing Labs We placed an order today for your standing lab work.   Please have your standing labs drawn at the end of April and every 3 months   Please have your labs drawn 2 weeks prior to your appointment so that the provider can discuss your lab results at your appointment, if possible.  Please note that you may see your imaging and lab results in MyChart before we have reviewed them. We will contact you once all results are reviewed. Please allow our office up to 72 hours to thoroughly review all of the results before contacting the office for clarification of your results.  WALK-IN LAB HOURS  Monday through Thursday from 8:00 am -12:30 pm and 1:00 pm-5:00 pm and Friday from 8:00 am-12:00 pm.  Patients with office visits requiring labs will be seen before walk-in labs.  You may encounter longer than normal wait times. Please allow additional time. Wait times may be shorter on  Monday and Thursday afternoons.  We do not book appointments for walk-in labs. We appreciate your patience and understanding with our staff.   Labs are drawn by [facility]. Please bring your co-pay at the time of your lab draw.  You may receive a bill from [facility] for your lab work.  Please note if you are on Hydroxychloroquine and and an order has been placed for a Hydroxychloroquine level,  you will need to have it drawn 4 hours or more after your last dose.  If you wish to have your labs drawn at another location, please call the office 24 hours in advance so we can fax the orders.  The office is located at [address], [place], [place] [address]   If you have any questions regarding directions or hours of operation,  please call [phone].   As a reminder, please drink plenty of water prior to coming for your lab work. Thanks!

## 2023-12-08 NOTE — Progress Notes (Signed)
CBC WNL

## 2023-12-08 NOTE — Telephone Encounter (Signed)
Copied from CRM [id number]. Topic: Clinical - Medication Question >> Dec 08, 2023  3:05 PM Shelbie Proctor wrote: Reason for CRM: Patient [phone] is asking for a new blood glucose monitor, lancets and test strips. One Touch machine is cracked and expired, whatever Dr. Cheree Ditto would prescribe. Patient has one week left of supplies.   [id number] [address] [place] [address] Phone:[phone]:[phone]

## 2023-12-09 LAB — CBC WITH DIFFERENTIAL/PLATELET
Absolute Lymphocytes: 1204 {cells}/uL (ref 850–3900)
Absolute Monocytes: 515 {cells}/uL (ref 200–950)
Basophils Absolute: 61 {cells}/uL (ref 0–200)
Basophils Relative: 1.2 %
Eosinophils Absolute: 321 {cells}/uL (ref 15–500)
Eosinophils Relative: 6.3 %
HCT: 41.1 % (ref 35.0–45.0)
Hemoglobin: 13.3 g/dL (ref 11.7–15.5)
MCH: 28.6 pg (ref 27.0–33.0)
MCHC: 32.4 g/dL (ref 32.0–36.0)
MCV: 88.4 fL (ref 80.0–100.0)
MPV: 13.1 fL — ABNORMAL HIGH (ref 7.5–12.5)
Monocytes Relative: 10.1 %
Neutro Abs: 2999 {cells}/uL (ref 1500–7800)
Neutrophils Relative %: 58.8 %
Platelets: 199 10*3/uL (ref 140–400)
RBC: 4.65 10*6/uL (ref 3.80–5.10)
RDW: 12.2 % (ref 11.0–15.0)
Total Lymphocyte: 23.6 %
WBC: 5.1 10*3/uL (ref 3.8–10.8)

## 2023-12-09 LAB — COMPLETE METABOLIC PANEL WITH GFR
AG Ratio: 1.8 (calc) (ref 1.0–2.5)
ALT: 16 U/L (ref 6–29)
AST: 12 U/L (ref 10–35)
Albumin: 4.4 g/dL (ref 3.6–5.1)
Alkaline phosphatase (APISO): 60 U/L (ref 37–153)
BUN: 24 mg/dL (ref 7–25)
CO2: 28 mmol/L (ref 20–32)
Calcium: 9.6 mg/dL (ref 8.6–10.4)
Chloride: 103 mmol/L (ref 98–110)
Creat: 0.93 mg/dL (ref 0.50–1.05)
Globulin: 2.5 g/dL (ref 1.9–3.7)
Glucose, Bld: 203 mg/dL — ABNORMAL HIGH (ref 65–99)
Potassium: 4.7 mmol/L (ref 3.5–5.3)
Sodium: 141 mmol/L (ref 135–146)
Total Bilirubin: 0.5 mg/dL (ref 0.2–1.2)
Total Protein: 6.9 g/dL (ref 6.1–8.1)
eGFR: 67 mL/min/{1.73_m2} (ref >=60)

## 2023-12-09 LAB — URIC ACID: Uric Acid, Serum: 3.9 mg/dL (ref 2.5–7.0)

## 2023-12-09 LAB — ANTI-DNA ANTIBODY, DOUBLE-STRANDED: ds DNA Ab: <1 [IU]/mL

## 2023-12-09 LAB — PROTEIN / CREATININE RATIO, URINE
Creatinine, Urine: 40 mg/dL (ref 20–275)
Total Protein, Urine: <4 mg/dL — ABNORMAL LOW (ref 5–24)

## 2023-12-09 LAB — SEDIMENTATION RATE: Sed Rate: 17 mm/h (ref 0–30)

## 2023-12-09 LAB — C3 AND C4
C3 Complement: 149 mg/dL (ref 83–193)
C4 Complement: 25 mg/dL (ref 15–57)

## 2023-12-09 NOTE — Progress Notes (Signed)
No proteinuria.   Glucose is 203.  Rest of CMP WNL.  Complements WNL ESR WNL Uric acid WNL

## 2023-12-12 ENCOUNTER — Other Ambulatory Visit: Payer: Self-pay

## 2023-12-12 MED ORDER — ONETOUCH VERIO VI STRP: ORAL_STRIP | 0 refills | Status: DC

## 2023-12-12 MED ORDER — ONETOUCH DELICA LANCETS 33G MISC: 1.0000 | Freq: Two times a day (BID) | 0 refills | Status: DC

## 2023-12-12 MED ORDER — GLUCOCOM BLOOD GLUCOSE MONITOR DEVI: 1.0000 | Freq: Once | 0 refills | Status: AC

## 2023-12-12 NOTE — Progress Notes (Signed)
dsDNA is negative.  Labs are not consistent with a flare.

## 2023-12-16 ENCOUNTER — Telehealth: Payer: Self-pay

## 2023-12-16 ENCOUNTER — Telehealth: Payer: Self-pay | Admitting: Family Medicine

## 2023-12-16 ENCOUNTER — Ambulatory Visit: Payer: Self-pay | Admitting: Family Medicine

## 2023-12-16 ENCOUNTER — Other Ambulatory Visit: Payer: Self-pay

## 2023-12-16 MED ORDER — BLOOD GLUCOSE MONITORING SUPPL DEVI: 1.0000 | Freq: Three times a day (TID) | 0 refills | Status: DC

## 2023-12-16 NOTE — Telephone Encounter (Signed)
 Sent to pharmacy

## 2023-12-16 NOTE — Telephone Encounter (Signed)
  1. REASON FOR CALL or QUESTION: What is your reason for calling today? or How can I best help you? or What question do you have that I can help answer?     Patient called in stating she spoke with pharmacist today 12/16/23 and pharmacist said something happened to the prescription sent over for the new glucose management device and she needs physician to send another order over today.  Protocols used: Information Only Call - No Triage-A-AH    Copied from CRM [id number]. Topic: Clinical - Medication Refill >> Dec 16, 2023 11:32 AM Corin V wrote: Most Recent Primary Care Visit:  Provider: JIMMY ADE I  Department: [facility]  Visit Type: OFFICE VISIT  Date: 04/21/2023  Medication: Blood Glucose Monitoring Suppl (GLUCOCOM BLOOD GLUCOSE MONITOR) DEVI- her old monitor is cracked and she needs a new one  Has the patient contacted their pharmacy? Yes (Agent: If no, request that the patient contact the pharmacy for the refill. If patient does not wish to contact the pharmacy document the reason why and proceed with request.) (Agent: If yes, when and what did the pharmacy advise?)  Is this the correct pharmacy for this prescription? Yes If no, delete pharmacy and type the correct one.  This is the patient's preferred pharmacy:  [id number] [place],  - [address] [place] [address] Phone: [phone] Fax: [phone]   Has the prescription been filled recently? No  Is the patient out of the medication? Yes  Has the patient been seen for an appointment in the last year OR does the patient have an upcoming appointment? Yes  Can we respond through MyChart? Yes  Agent: Please be advised that Rx refills may take up to 3 business days. We ask that you follow-up with your pharmacy. Reason for Disposition  [1] Caller requesting NON-URGENT health information AND [2] PCP's office is the best resource  Answer Assessment - Initial Assessment Questions 1.  REASON FOR CALL or QUESTION: What is your reason for calling today? or How can I best help you? or What question do you have that I can help answer?     Patient called in stating she spoke with pharmacist today 12/16/23 and pharmacist said something happened to the prescription sent over for the new glucose management device and she needs physician to send another order over today.  Protocols used: Information Only Call - No Triage-A-AH

## 2023-12-16 NOTE — Telephone Encounter (Signed)
 Copied from CRM [id number]. Topic: Clinical - Request for Lab/Test Order >> Dec 16, 2023 11:40 AM Corin V wrote: Reason for CRM: Patient needs any labs needed for her physical in March ordered and sent to a lab near her office in [place]. She works at [employer] [address], [place], [place] and would be open to any lab near there.

## 2023-12-16 NOTE — Telephone Encounter (Signed)
 Copied from CRM [id number]. Topic: Clinical - Medication Refill >> Dec 16, 2023 11:32 AM Corin V wrote: Most Recent Primary Care Visit:  Provider: JIMMY ADE I  Department: [facility]  Visit Type: OFFICE VISIT  Date: 04/21/2023  Medication: Blood Glucose Monitoring Suppl (GLUCOCOM BLOOD GLUCOSE MONITOR) DEVI- her old monitor is cracked and she needs a new one  Has the patient contacted their pharmacy? Yes (Agent: If no, request that the patient contact the pharmacy for the refill. If patient does not wish to contact the pharmacy document the reason why and proceed with request.) (Agent: If yes, when and what did the pharmacy advise?)  Is this the correct pharmacy for this prescription? Yes If no, delete pharmacy and type the correct one.  This is the patient's preferred pharmacy:  [id number] [place], [place] - [address] [place] [address] Phone: [phone] Fax: [phone]   Has the prescription been filled recently? No  Is the patient out of the medication? Yes  Has the patient been seen for an appointment in the last year OR does the patient have an upcoming appointment? Yes  Can we respond through MyChart? Yes  Agent: Please be advised that Rx refills may take up to 3 business days. We ask that you follow-up with your pharmacy.

## 2023-12-16 NOTE — Telephone Encounter (Signed)
 Closing encounter. Spoke with pharmacy and they have the order I did call and advise patient. She will just wait for them to let her know its ready for pickup

## 2023-12-16 NOTE — Telephone Encounter (Signed)
 Duplicate, This has been addressed

## 2023-12-16 NOTE — Telephone Encounter (Signed)
 Closing encounter. Issue has already been addressed

## 2023-12-17 ENCOUNTER — Other Ambulatory Visit: Payer: Self-pay | Admitting: Physician Assistant

## 2023-12-17 DIAGNOSIS — M359 Systemic involvement of connective tissue, unspecified: Secondary | ICD-10-CM

## 2023-12-19 NOTE — Telephone Encounter (Signed)
 Last Fill: 09/19/2023  Labs: 12/08/2023 Glucose is 203.  Rest of CMP WNL. CBC WNL   Next Visit: 05/23/2024  Last Visit: 12/08/2023  DX: Autoimmune disease   Current Dose per office note 12/08/2023:  Arava  10 mg 1 tablet by mouth daily.   Okay to refill Arava  ?

## 2024-01-10 ENCOUNTER — Ambulatory Visit (INDEPENDENT_AMBULATORY_CARE_PROVIDER_SITE_OTHER): Payer: BC Managed Care – PPO | Admitting: Family Medicine

## 2024-01-10 ENCOUNTER — Encounter: Payer: Self-pay | Admitting: Family Medicine

## 2024-01-10 VITALS — BP 128/74 | HR 72 | Temp 98.5°F | Ht 61.81 in | Wt 176.0 lb

## 2024-01-10 DIAGNOSIS — E785 Hyperlipidemia, unspecified: Secondary | ICD-10-CM

## 2024-01-10 DIAGNOSIS — Z1211 Encounter for screening for malignant neoplasm of colon: Secondary | ICD-10-CM

## 2024-01-10 DIAGNOSIS — Z7189 Other specified counseling: Secondary | ICD-10-CM

## 2024-01-10 DIAGNOSIS — E1165 Type 2 diabetes mellitus with hyperglycemia: Secondary | ICD-10-CM

## 2024-01-10 DIAGNOSIS — M109 Gout, unspecified: Secondary | ICD-10-CM

## 2024-01-10 DIAGNOSIS — M359 Systemic involvement of connective tissue, unspecified: Secondary | ICD-10-CM

## 2024-01-10 DIAGNOSIS — Z0000 Encounter for general adult medical examination without abnormal findings: Secondary | ICD-10-CM

## 2024-01-10 DIAGNOSIS — I2510 Atherosclerotic heart disease of native coronary artery without angina pectoris: Secondary | ICD-10-CM | POA: Diagnosis not present

## 2024-01-10 LAB — LIPID PANEL
Cholesterol: 136 mg/dL (ref 0–200)
HDL: 49.2 mg/dL (ref >39.00)
LDL Cholesterol: 54 mg/dL (ref 0–99)
NonHDL: 86.76
Total CHOL/HDL Ratio: 3
Triglycerides: 162 mg/dL — ABNORMAL HIGH (ref 0.0–149.0)
VLDL: 32.4 mg/dL (ref 0.0–40.0)

## 2024-01-10 NOTE — Patient Instructions (Addendum)
 Please call [facility] if you don't get a preemptive call about the referral.   [address] [place], [place] [address] [phone]  Please ask [facility] to send a copy of your note when you go for a visit.    Go to the lab on the way out.   If you have mychart we'll likely use that to update you.     Ask rheumatology about the RSV vaccine.   Call cardiology about follow up.   Take care.  Glad to see you.

## 2024-01-10 NOTE — Progress Notes (Unsigned)
 DM per endo.  D/w pt about eye clinic follow up.  [facility], Dr. Randon Goldsmith.   Arthritis per outside clinic.  On Arava 10mg .  No active joint pain.  Compliant with med use.   History of coronary artery disease, non-STEMI July 2020, DES to RCA.  H/o HLD.  Using medication without problems or lightheadedness: yes Chest pain with exertion:no Edema:no Short of breath:no Muscle aches: no cramps on crestor.   Diet compliance: d/w pt.   Exercise: d/w pt.   D/w pt about cardiology f/u.   Labs pending.   H/o gout.  On allopurinol. No recent flares.  Uric acid level at goal on check per rheum.    Advance directive discussed with patient.  Son and daughter equally designated if patient were incapacitated. Vaccines d/w pt.   Flu 2024 She [facility] [place]ammogram was previously done at [place]. DXA 10/2022.   Colon cancer screening d/w pt 2025, see AVS.    Meds, vitals, and allergies reviewed.   ROS: Per HPI unless specifically indicated in ROS section   GEN: nad, alert and oriented HEENT: ncat NECK: supple w/o LA CV: rrr.  Soft SEM.  PULM: ctab, no inc wob ABD: soft, +bs EXT: no edema SKIN: well perfused.

## 2024-01-11 NOTE — Assessment & Plan Note (Signed)
 Per endo. See above.

## 2024-01-11 NOTE — Assessment & Plan Note (Signed)
 On Arava per outside clinic.

## 2024-01-11 NOTE — Assessment & Plan Note (Signed)
Advance directive discussed with patient.  Son and daughter equally designated if patient were incapacitated.

## 2024-01-11 NOTE — Assessment & Plan Note (Signed)
 Advance directive discussed with patient.  Son and daughter equally designated if patient were incapacitated. Vaccines d/w pt.   Flu 2024 She reported [facility] [place]. Mammogram was previously done at [place]. DXA 10/2022.   Colon cancer screening d/w pt 2025, see AVS.

## 2024-01-11 NOTE — Assessment & Plan Note (Signed)
 Continue aspirin, metoprolol, crestor.  No CP.  D/w pt about cardiology f/u.

## 2024-01-11 NOTE — Assessment & Plan Note (Signed)
 Continue crestor.  Labs pending.

## 2024-01-11 NOTE — Assessment & Plan Note (Signed)
 Continue on allopurinol. No recent flares.  Uric acid level at goal on check per rheum.

## 2024-01-15 ENCOUNTER — Encounter: Payer: Self-pay | Admitting: Family Medicine

## 2024-01-22 NOTE — Progress Notes (Unsigned)
 Cardiology Office Note  Date:  01/23/2024   ID:  Susan Daniels, DOB [birth year], MRN [id number]  PCP:  Joaquim Nam, MD   Chief Complaint  Patient presents with   12 month follow up     "Doing well."     HPI:  [name] is a 68 year old woman with past medical history of Coronary artery disease, non-STEMI July 2020, DES to RCA Rheumatoid arthritis/autoimmune disease Diabetes type 2 with complications Hypertension Hyperlipidemia Presenting for follow-up of her coronary disease, prior stenting  LOV October 2023 Thinking of retiring in the next few months, [facility]  Chronic knee pain, had a gel shot, seems to be holding No regular exercise program  Denies chest pain concerning for angina  Lab work reviewed, A1c trending down On Ozempic  Lab work reviewed Total cholesterol 136 LDL 54 A1c 7.7 down from 8.5 down from 9.8  EKG personally reviewed by myself on todays visit EKG Interpretation Date/Time:  Monday January 23 2024 09:13:48 EDT Ventricular Rate:  83 PR Interval:  134 QRS Duration:  84 QT Interval:  376 QTC Calculation: 441 R Axis:   46  Text Interpretation: Normal sinus rhythm Normal ECG When compared with ECG of 16-Jun-2020 09:44, T wave amplitude has increased in Anterior leads Confirmed by Julien Nordmann [phone]) on 01/23/2024 9:30:23 AM    Anginal sx 05/24/2019 Leading to catheterization and stenting Denies having significant anginal symptoms on today's visit  Details of her prior cardiac history reviewed as below 05/24/2019:   LHC 05/24/2019: NSTEMI; DES to 100% mRCA; nonobstructive and branch disease in other vessels; normal EF  Echo 05/24/2019: EF 60-65%, indeterminate diastolic function, no significant valve disease   PMH:   has a past medical history of Arthritis, CAD (coronary artery disease), Diabetes mellitus without complication (HCC), Headache(784.0), and Heart murmur.  PSH:    Past Surgical History:  Procedure  Laterality Date   COLONOSCOPY WITH PROPOFOL N/A 01/30/2013   Procedure: COLONOSCOPY WITH PROPOFOL;  Surgeon: Charolett Bumpers, MD;  Location: [facility];  Service: Endoscopy;  Laterality: N/A;   dental implant  09/29/2018   HEART STENT     TUBAL LIGATION      Current Outpatient Medications  Medication Sig Dispense Refill   allopurinol (ZYLOPRIM) 100 MG tablet TAKE 1 TABLET BY MOUTH EVERY DAY 90 tablet 0   aspirin 81 MG chewable tablet Chew 81 mg by mouth daily.      B-D ULTRA-FINE 33 LANCETS MISC Use as directed to test blood glucose     Blood Glucose Monitoring Suppl DEVI 1 each by Does not apply route in the morning, at noon, and at bedtime. May substitute to any manufacturer covered by patient's insurance. 1 each 0   CALCIUM PO Take by mouth daily.     Cholecalciferol (VITAMIN D PO) Take by mouth daily.     Empagliflozin-metFORMIN HCl (SYNJARDY) 12.03-999 MG TABS Take 1 tablet by mouth 2 (two) times daily. 180 tablet 3   glimepiride (AMARYL) 1 MG tablet Take 1 tablet (1 mg total) by mouth daily with breakfast. 90 tablet 3   leflunomide (ARAVA) 10 MG tablet TAKE 1 TABLET BY MOUTH EVERY DAY 90 tablet 0   lisinopril (ZESTRIL) 2.5 MG tablet Take 1 tablet (2.5 mg total) by mouth daily. 90 tablet 3   metoprolol tartrate (LOPRESSOR) 25 MG tablet TAKE 1/2 TABLET TWICE A DAY BY MOUTH 90 tablet 0   nitroGLYCERIN (NITROSTAT) 0.4 MG SL tablet Place 1 tablet (0.4 mg total)  under the tongue as needed. 25 tablet 3   OneTouch Delica Lancets 33G MISC 1 each by Other route 2 (two) times daily. 100 each 0   ONETOUCH VERIO test strip Use as instructed 100 each 0   rosuvastatin (CRESTOR) 10 MG tablet Take 5 mg by mouth at bedtime. 1/2 tablet once a day     Semaglutide, 1 MG/DOSE, (OZEMPIC, 1 MG/DOSE,) 4 MG/3ML SOPN 1 mg by Other route once a week. 9 mL 2   No current facility-administered medications for this visit.    Allergies:   Aspirin, Epinephrine, and Hydrocodone   Social History:  The patient   reports that she quit smoking about 44 years ago. Her smoking use included cigarettes. She started smoking about 51 years ago. She has a 3.5 pack-year smoking history. She has never been exposed to tobacco smoke. She has never used smokeless tobacco. She reports current alcohol use. She reports that she does not use drugs.   Family History:   family history includes Breast cancer in her maternal aunt; Dementia in her maternal uncle; Healthy in her daughter, mother, and son; Heart Problems in her maternal grandmother; Heart disease in her father; Hypertension in her sister; Rheumatic fever in her father, maternal grandmother, and sister.    Review of Systems: Review of Systems  Constitutional: Negative.   HENT: Negative.    Respiratory: Negative.    Cardiovascular: Negative.   Gastrointestinal: Negative.   Musculoskeletal: Negative.   Neurological:  Positive for speech change.  Psychiatric/Behavioral: Negative.    All other systems reviewed and are negative.   PHYSICAL EXAM: VS:  BP 128/70 (BP Location: Left Arm, Patient Position: Sitting, Cuff Size: Normal)   Pulse 83   Ht 5\' 2"  (1.575 m)   Wt 176 lb 4 oz (79.9 kg)   SpO2 97%   BMI 32.24 kg/m  , BMI Body mass index is 32.24 kg/m. Constitutional:  oriented to person, place, and time. No distress.  HENT:  Head: Grossly normal Eyes:  no discharge. No scleral icterus.  Neck: No JVD, no carotid bruits  Cardiovascular: Regular rate and rhythm, no murmurs appreciated Pulmonary/Chest: Clear to auscultation bilaterally, no wheezes or rails Abdominal: Soft.  no distension.  no tenderness.  Musculoskeletal: Normal range of motion Neurological:  normal muscle tone. Coordination normal. No atrophy Skin: Skin warm and dry Psychiatric: normal affect, pleasant  Recent Labs: 04/21/2023: TSH 2.86 12/08/2023: ALT 16; BUN 24; Creat 0.93; Hemoglobin 13.3; Platelets 199; Potassium 4.7; Sodium 141    Lipid Panel Lab Results  Component Value  Date   CHOL 136 01/10/2024   HDL 49.20 01/10/2024   LDLCALC 54 01/10/2024   TRIG 162.0 (H) 01/10/2024      Wt Readings from Last 3 Encounters:  01/23/24 176 lb 4 oz (79.9 kg)  01/10/24 176 lb (79.8 kg)  12/08/23 176 lb (79.8 kg)     ASSESSMENT AND PLAN:  Problem List Items Addressed This Visit       Cardiology Problems   CAD (coronary artery disease), native coronary artery - Primary   Relevant Orders   EKG 12-Lead (Completed)     Other   Dyslipidemia   Type 2 diabetes mellitus with hyperglycemia, without long-term current use of insulin (HCC)   Other Visit Diagnoses       Diabetes mellitus type 2 with complications (HCC)       Relevant Orders   EKG 12-Lead (Completed)      Coronary disease with stable angina Currently with  no symptoms of angina. No further workup at this time. Continue current medication regimen.  Cholesterol at goal  Diabetes type 2 with complications A1c 7.7, assisted by endocrinology On Ozempic We have encouraged continued exercise, careful diet management in an effort to lose weight.  Hyperlipidemia Cholesterol at goal, continue low-dose Crestor  obesity Recommend walking program, low carbohydrates  Autoimmune disorder Followed by rheumatology Reports symptoms are stable   Signed, Dossie Arbour, M.D., Ph.D. [facility] [place], [place] [phone]

## 2024-01-23 ENCOUNTER — Encounter: Payer: Self-pay | Admitting: Cardiovascular Disease

## 2024-01-23 ENCOUNTER — Ambulatory Visit: Attending: Cardiovascular Disease | Admitting: Cardiovascular Disease

## 2024-01-23 VITALS — BP 128/70 | HR 83 | Ht 62.0 in | Wt 176.2 lb

## 2024-01-23 DIAGNOSIS — E1165 Type 2 diabetes mellitus with hyperglycemia: Secondary | ICD-10-CM

## 2024-01-23 DIAGNOSIS — E118 Type 2 diabetes mellitus with unspecified complications: Secondary | ICD-10-CM

## 2024-01-23 DIAGNOSIS — I25118 Atherosclerotic heart disease of native coronary artery with other forms of angina pectoris: Secondary | ICD-10-CM | POA: Diagnosis not present

## 2024-01-23 DIAGNOSIS — E785 Hyperlipidemia, unspecified: Secondary | ICD-10-CM | POA: Diagnosis not present

## 2024-01-23 MED ORDER — METOPROLOL TARTRATE 25 MG PO TABS: 12.5000 mg | ORAL_TABLET | Freq: Two times a day (BID) | ORAL | 3 refills | Status: AC

## 2024-01-23 MED ORDER — NITROGLYCERIN 0.4 MG SL SUBL: 0.4000 mg | SUBLINGUAL_TABLET | SUBLINGUAL | 3 refills | Status: AC | PRN

## 2024-01-23 NOTE — Patient Instructions (Signed)

## 2024-01-30 ENCOUNTER — Encounter: Payer: Self-pay | Admitting: *Deleted

## 2024-02-22 ENCOUNTER — Other Ambulatory Visit: Payer: Self-pay | Admitting: Physician Assistant

## 2024-02-22 NOTE — Telephone Encounter (Signed)
 Last Fill: 11/24/2023  Labs: 12/08/2023 No proteinuria.   Glucose is 203.  Rest of CMP WNL. 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   Okay to refill Allopurinol?

## 2024-03-05 ENCOUNTER — Other Ambulatory Visit: Payer: Self-pay | Admitting: Family Medicine

## 2024-03-15 ENCOUNTER — Other Ambulatory Visit: Payer: Self-pay | Admitting: Family Medicine

## 2024-03-22 ENCOUNTER — Other Ambulatory Visit: Payer: Self-pay | Admitting: Physician Assistant

## 2024-03-22 DIAGNOSIS — M359 Systemic involvement of connective tissue, unspecified: Secondary | ICD-10-CM

## 2024-03-22 NOTE — Telephone Encounter (Signed)
 Last Fill: 12/19/2023  Labs: 12/08/2023 Glucose is 203.  Rest of CMP WNL. , CBC WNL   Next Visit: 05/23/2024  Last Visit: 12/08/2023  DX: Autoimmune disease   Current Dose per office note 12/08/2023: Arava  10 mg 1 tablet by mouth daily.   Patient advised she is due for labs. Patient states she will come to the office to have labs around 04/05/2024.   Okay to refill Arava  ?

## 2024-03-23 LAB — HM COLONOSCOPY

## 2024-04-04 ENCOUNTER — Ambulatory Visit: Payer: BC Managed Care – PPO | Admitting: Internal Medicine

## 2024-04-14 ENCOUNTER — Other Ambulatory Visit: Payer: Self-pay | Admitting: Family Medicine

## 2024-04-18 ENCOUNTER — Other Ambulatory Visit: Payer: Self-pay | Admitting: Rheumatology

## 2024-04-18 DIAGNOSIS — M359 Systemic involvement of connective tissue, unspecified: Secondary | ICD-10-CM

## 2024-05-02 ENCOUNTER — Other Ambulatory Visit: Payer: Self-pay | Admitting: Physician Assistant

## 2024-05-02 ENCOUNTER — Telehealth: Payer: Self-pay | Admitting: Rheumatology

## 2024-05-02 ENCOUNTER — Other Ambulatory Visit: Payer: Self-pay | Admitting: Family Medicine

## 2024-05-02 ENCOUNTER — Encounter: Payer: Self-pay | Admitting: Internal Medicine

## 2024-05-02 ENCOUNTER — Other Ambulatory Visit: Payer: Self-pay | Admitting: Rheumatology

## 2024-05-02 ENCOUNTER — Other Ambulatory Visit: Payer: Self-pay

## 2024-05-02 ENCOUNTER — Ambulatory Visit: Admitting: Internal Medicine

## 2024-05-02 VITALS — BP 120/72 | HR 72 | Ht 62.0 in | Wt 174.0 lb

## 2024-05-02 DIAGNOSIS — M359 Systemic involvement of connective tissue, unspecified: Secondary | ICD-10-CM

## 2024-05-02 DIAGNOSIS — Z794 Long term (current) use of insulin: Secondary | ICD-10-CM

## 2024-05-02 DIAGNOSIS — E1165 Type 2 diabetes mellitus with hyperglycemia: Secondary | ICD-10-CM | POA: Diagnosis not present

## 2024-05-02 DIAGNOSIS — E1159 Type 2 diabetes mellitus with other circulatory complications: Secondary | ICD-10-CM

## 2024-05-02 LAB — POCT GLYCOSYLATED HEMOGLOBIN (HGB A1C): Hemoglobin A1C: 7.9 % — AB (ref 4.0–5.6)

## 2024-05-02 MED ORDER — GLIMEPIRIDE 2 MG PO TABS: 2.0000 mg | ORAL_TABLET | Freq: Every day | ORAL | 3 refills | Status: AC

## 2024-05-02 MED ORDER — EMPAGLIFLOZIN 25 MG PO TABS: 25.0000 mg | ORAL_TABLET | Freq: Every day | ORAL | 3 refills | Status: DC

## 2024-05-02 MED ORDER — METFORMIN HCL ER 750 MG PO TB24: 1500.0000 mg | ORAL_TABLET | Freq: Every day | ORAL | 3 refills | Status: DC

## 2024-05-02 NOTE — Progress Notes (Signed)
 Name: Susan Daniels  MRN/ DOB: [id number], 1956/04/21   Age/ Sex: 68 y.o., female    PCP: Cleatus Arlyss RAMAN, MD   Reason for Endocrinology Evaluation: Type 2 Diabetes Mellitus     Date of Initial Endocrinology Visit: 02/03/2022    PATIENT IDENTIFIER: Susan Daniels is a 68 y.o. female with a past medical history of T2DM, autoimmune disease. The patient presented for initial endocrinology clinic visit on 02/03/2022 for consultative assistance with her diabetes management.    HPI: Susan Daniels was    Diagnosed with DM ~ 10 yrs  Prior Medications tried/Intolerance: Was on insulin . 2020 after discharge for MI . Started Ozempic  2021          Hemoglobin A1c  ranging from 8.0 %, peaking at 9.8% in 2023.   She follows with Dr. Gollan ( cardiology)  She has noted abdominal pain with the urge to vomit  the day following ozempic  , its short lived  Glimepiride  started 04/2023 with an A1c of 8.0%  SUBJECTIVE:   During the last visit (10/03/2023): A1c 7.7 %   Today (05/02/24): Mr. Lave is here for a follow up on diabetes management   She checks checks her blood sugars 1 times daily. The patient has not had hypoglycemic episodes since the last clinic visit.   Patient follows with rheumatology for autoimmune disease and osteoarthritis  She follows with cardiology m for CAD and dyslipidemia  Denies nausea or vomiting  Denies constipation or diarrhea  She held Ozempic  for 3 weeks due to   HOME DIABETES REGIMEN: Glimepiride  1 mg daily Synjardy  12.03-999 mg , BID  Ozempic  1 mg weekly      Statin: yes ACE-I/ARB: yes   METER DOWNLOAD SUMMARY: 5/27-6/25/2025 Fingerstick Blood Glucose Tests = 12 Average Number Tests/Day = 0 Overall Mean FS Glucose = 194 Standard Deviation = 28  BG Ranges: Low = 149 High = 246  BG Target % Results: % In target = 33 % Over target = 67 % Under target = 0  Hypoglycemic Events/30 Days: BG < 50 = 0 Episodes of symptomatic  severe hypoglycemia = 0    DIABETIC COMPLICATIONS: Microvascular complications:   Denies: CKD, retinopathy  Last eye exam: Completed 2023  Macrovascular complications:  CAD Denies: PVD, CVA   PAST HISTORY: Past Medical History:  Past Medical History:  Diagnosis Date   Arthritis    Rheumatoid   CAD (coronary artery disease)    s/p stent x1   Diabetes mellitus without complication (HCC)    Headache(784.0)    migraines   Heart murmur    Past Surgical History:  Past Surgical History:  Procedure Laterality Date   COLONOSCOPY WITH PROPOFOL  N/A 01/30/2013   Procedure: COLONOSCOPY WITH PROPOFOL ;  Surgeon: Gladis MARLA Louder, MD;  Location: [facility];  Service: Endoscopy;  Laterality: N/A;   dental implant  09/29/2018   HEART STENT     TUBAL LIGATION      Social History:  reports that she quit smoking about 44 years ago. Her smoking use included cigarettes. She started smoking about 51 years ago. She has a 3.5 pack-year smoking history. She has never been exposed to tobacco smoke. She has never used smokeless tobacco. She reports current alcohol use. She reports that she does not use drugs. Family History:  Family History  Problem Relation Age of Onset   Healthy Mother    Heart disease Father    Rheumatic fever Father    Hypertension Sister  Rheumatic fever Sister    Breast cancer Maternal Aunt    Dementia Maternal Uncle    Rheumatic fever Maternal Grandmother    Heart Problems Maternal Grandmother    Healthy Daughter    Healthy Son    Colon cancer Neg Hx      HOME MEDICATIONS: Allergies as of 05/02/2024       Reactions   Aspirin    Can not take high doses of aspirin- h/o heart racing with use    Codeine    Other Reaction(s): patient flips out   Epinephrine    Hydrocodone Nausea And Vomiting        Medication List        Accurate as of May 02, 2024 12:07 PM. If you have any questions, ask your nurse or doctor.          allopurinol  100 MG  tablet Commonly known as: ZYLOPRIM  TAKE 1 TABLET BY MOUTH EVERY DAY   aspirin 81 MG chewable tablet Chew 81 mg by mouth daily.   B-D ULTRA-FINE 33 LANCETS Misc Use as directed to test blood glucose   OneTouch Delica Plus Lancet33G Misc 1 EACH BY OTHER ROUTE 2 (TWO) TIMES DAILY.   CALCIUM PO Take by mouth daily.   glimepiride  1 MG tablet Commonly known as: AMARYL  Take 1 tablet (1 mg total) by mouth daily with breakfast.   leflunomide  10 MG tablet Commonly known as: ARAVA  TAKE 1 TABLET BY MOUTH EVERY DAY   lisinopril  2.5 MG tablet Commonly known as: ZESTRIL  TAKE 1 TABLET BY MOUTH EVERY DAY   metoprolol  tartrate 25 MG tablet Commonly known as: LOPRESSOR  Take 0.5 tablets (12.5 mg total) by mouth 2 (two) times daily.   nitroGLYCERIN  0.4 MG SL tablet Commonly known as: NITROSTAT  Place 1 tablet (0.4 mg total) under the tongue as needed.   OneTouch Verio Flex System w/Device Kit USE IN THE MORNING, AT NOON, AND AT BEDTIME.   OneTouch Verio test strip Generic drug: glucose blood USE AS INSTRUCTED What changed:  when to take this additional instructions Changed by: Arlyss Solian   Ozempic  (1 MG/DOSE) 4 MG/3ML Sopn Generic drug: Semaglutide  (1 MG/DOSE) 1 mg by Other route once a week.   rosuvastatin 10 MG tablet Commonly known as: CRESTOR Take 5 mg by mouth at bedtime. 1/2 tablet once a day   Synjardy  12.03-999 MG Tabs Generic drug: Empagliflozin -metFORMIN  HCl Take 1 tablet by mouth 2 (two) times daily.   VITAMIN D  PO Take by mouth daily.         ALLERGIES: Allergies  Allergen Reactions   Aspirin     Can not take high doses of aspirin- h/o heart racing with use    Codeine     Other Reaction(s): patient flips out   Epinephrine    Hydrocodone Nausea And Vomiting     REVIEW OF SYSTEMS: A comprehensive ROS was conducted with the patient and is negative except as per HPI    OBJECTIVE:   VITAL SIGNS: BP 120/72 (BP Location: Left Arm, Patient  Position: Sitting, Cuff Size: Normal)   Pulse 72   Ht 5' 2 (1.575 m)   Wt 174 lb (78.9 kg)   SpO2 98%   BMI 31.83 kg/m      Filed Weights   05/02/24 1201  Weight: 174 lb (78.9 kg)    PHYSICAL EXAM:  General: Pt appears well and is in NAD  Lungs: Clear with good BS bilat with no rales, rhonchi, or wheezes  Heart:  RRR , + systolic murmur   Extremities:  Lower extremities - No pretibial edema  Neuro: MS is good with appropriate affect, pt is alert and Ox3    DM foot exam: 04/11/2023  The skin of the feet is intact without sores or ulcerations. The pedal pulses are 2+ on right and 2+ on left. The sensation is intact to a screening 5.07, 10 gram monofilament bilaterally    DATA REVIEWED:  Lab Results  Component Value Date   HGBA1C 7.7 (A) 10/03/2023   HGBA1C 8.0 (A) 04/11/2023   HGBA1C 8.2 (A) 10/07/2022    Latest Reference Range & Units 12/08/23 08:47 01/10/24 10:28  Sodium 135 - 146 mmol/L 141   Potassium 3.5 - 5.3 mmol/L 4.7   Chloride 98 - 110 mmol/L 103   CO2 20 - 32 mmol/L 28   Glucose 65 - 99 mg/dL 796 (H)   BUN 7 - 25 mg/dL 24   Creatinine 9.49 - 1.05 mg/dL 9.06   Calcium 8.6 - 89.5 mg/dL 9.6   BUN/Creatinine Ratio 6 - 22 (calc) SEE NOTE:   eGFR > OR = 60 mL/min/1.71m2 67   AG Ratio 1.0 - 2.5 (calc) 1.8   Uric Acid, Serum 2.5 - 7.0 mg/dL 3.9   AST 10 - 35 U/L 12   ALT 6 - 29 U/L 16   Total Protein 6.1 - 8.1 g/dL 6.9   Total Bilirubin 0.2 - 1.2 mg/dL 0.5   Total CHOL/HDL Ratio   3  Cholesterol 0 - 200 mg/dL  863  HDL Cholesterol >60.99 mg/dL  50.79  LDL (calc) 0 - 99 mg/dL  54  NonHDL   13.23  Triglycerides 0.0 - 149.0 mg/dL  837.9 (H)  VLDL 0.0 - 40.0 mg/dL  67.5  (H): Data is abnormally high  ASSESSMENT / PLAN / RECOMMENDATIONS:   1) Type 2 Diabetes Mellitus,Sub-optimally  controlled, With Macrovascular  complications - Most recent A1c of 7.9 %. Goal A1c < 7.0 %.    - Patient continues with persistent hyperglycemia - She is changing her  insurance and Synjardy  as well as Ozempic  are cost prohibitive.  We have opted to switch Synjardy  to Jardiance  and metformin  and see what the cost would be - In the meantime, will increase glimepiride  - MA/CR ratio normal  MEDICATIONS: Stop Synjardy  12.03-999 mg, 2 tabs daily Continue  Ozempic  1 mg weekly Take metformin  750 mg XR, 2 tabs daily Take Jardiance  25 mg daily Increase  Glimepiride  2 mg, 1 tablet before Breakfast   EDUCATION / INSTRUCTIONS: BG monitoring instructions: Patient is instructed to check her blood sugars 1 times a day Call [facility] if: BG persistently < 70 I reviewed the Rule of 15 for the treatment of hypoglycemia in detail with the patient. Literature supplied.   2) Diabetic complications:  Eye: Does not have known diabetic retinopathy.  Neuro/ Feet: Does not have known diabetic peripheral neuropathy. Renal: Patient does not have known baseline CKD. She is  on an ACEI/ARB at present.   3) CAD/Dyslipidemia:   - Per cardiology  F/U in 6 months    I spent 25 minutes preparing to see the patient by review of recent labs, imaging and procedures, obtaining and reviewing separately obtained history, communicating with the patient, ordering medications, tests or procedures, and documenting clinical information in the EHR including the differential Dx, treatment, and any further evaluation and other management   Signed electronically by: Stefano Redgie Butts, MD  [facility]  [facility] [address] [place], [place] [address] Phone: [phone] FAX: [phone]   CC: Cleatus Arlyss RAMAN, MD [address] [place] [address] Phone: [phone]  Fax: [phone]    Return to Endocrinology clinic as below: Future Appointments  Date Time Provider Department Center  05/02/2024 12:10 PM Margarett Viti, Donell Cardinal, MD [facility] None  05/23/2024 11:20 AM Dolphus Reiter, MD [facility]  None  01/04/2025  8:30 AM [facility]  01/11/2025 11:00 AM Cleatus Arlyss RAMAN, MD [facility]

## 2024-05-02 NOTE — Patient Instructions (Addendum)
-   Increase Glimepiride  2 mg , 1 tablet before Breakfast  - Stop Synjardy  12.03-999 mg, TWO tablets daily  - Take Metformin 750 mg XR, 2 tablets daily -Take Jardiance 25 mg, 1 tablet every morning - Continue  Ozempic  1 mg weekly    HOW TO TREAT LOW BLOOD SUGARS (Blood sugar LESS THAN 70 MG/DL) Please follow the RULE OF 15 for the treatment of hypoglycemia treatment (when your (blood sugars are less than 70 mg/dL)   STEP 1: Take 15 grams of carbohydrates when your blood sugar is low, which includes:  3-4 GLUCOSE TABS  OR 3-4 OZ OF JUICE OR REGULAR SODA OR ONE TUBE OF GLUCOSE GEL    STEP 2: RECHECK blood sugar in 15 MINUTES STEP 3: If your blood sugar is still low at the 15 minute recheck --> then, go back to STEP 1 and treat AGAIN with another 15 grams of carbohydrates.

## 2024-05-02 NOTE — Telephone Encounter (Addendum)
 Last Fill: 02/22/2024  Labs: 12/08/2023 No proteinuria.   Glucose is 203.  Rest of CMP WNL.  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

## 2024-05-02 NOTE — Telephone Encounter (Signed)
 Last Fill: 02/22/2024  Labs: 12/08/2023 No proteinuria.   Glucose is 203.  Rest of CMP WNL.  Complements WNL  ESR WNL  Uric acid WNL   LMOM for patient for patient to update labs  Next Visit: 05/23/2024  Last Visit: 12/08/2023  DX: Autoimmune disease   Current Dose per office note 12/08/2023: Arava  10 mg 1 tablet by mouth daily    Okay to refill Arava ?

## 2024-05-02 NOTE — Telephone Encounter (Signed)
 Patient left a voicemail stating she was returning a call to our office regarding her labwork.  Patient states she plans to have labwork at her appointment on 05/23/24.

## 2024-05-02 NOTE — Telephone Encounter (Signed)
 Contacted patient and explained why labs are due to be updated, patient stated she would be in the office today to get those done.

## 2024-05-03 ENCOUNTER — Ambulatory Visit: Payer: Self-pay | Admitting: Physician Assistant

## 2024-05-03 LAB — COMPREHENSIVE METABOLIC PANEL WITH GFR
AG Ratio: 2.1 (calc) (ref 1.0–2.5)
ALT: 14 U/L (ref 6–29)
AST: 14 U/L (ref 10–35)
Albumin: 4.5 g/dL (ref 3.6–5.1)
Alkaline phosphatase (APISO): 60 U/L (ref 37–153)
BUN: 18 mg/dL (ref 7–25)
CO2: 27 mmol/L (ref 20–32)
Calcium: 9.4 mg/dL (ref 8.6–10.4)
Chloride: 105 mmol/L (ref 98–110)
Creat: 0.8 mg/dL (ref 0.50–1.05)
Globulin: 2.1 g/dL (ref 1.9–3.7)
Glucose, Bld: 79 mg/dL (ref 65–99)
Potassium: 4.1 mmol/L (ref 3.5–5.3)
Sodium: 138 mmol/L (ref 135–146)
Total Bilirubin: 0.6 mg/dL (ref 0.2–1.2)
Total Protein: 6.6 g/dL (ref 6.1–8.1)
eGFR: 80 mL/min/{1.73_m2} (ref >=60)

## 2024-05-03 LAB — C3 AND C4
C3 Complement: 150 mg/dL (ref 83–193)
C4 Complement: 25 mg/dL (ref 15–57)

## 2024-05-03 LAB — CBC WITH DIFFERENTIAL/PLATELET
Absolute Lymphocytes: 1363 {cells}/uL (ref 850–3900)
Absolute Monocytes: 563 {cells}/uL (ref 200–950)
Basophils Absolute: 58 {cells}/uL (ref 0–200)
Basophils Relative: 1 %
Eosinophils Absolute: 273 {cells}/uL (ref 15–500)
Eosinophils Relative: 4.7 %
HCT: 40.2 % (ref 35.0–45.0)
Hemoglobin: 12.9 g/dL (ref 11.7–15.5)
MCH: 28.6 pg (ref 27.0–33.0)
MCHC: 32.1 g/dL (ref 32.0–36.0)
MCV: 89.1 fL (ref 80.0–100.0)
MPV: 12.2 fL (ref 7.5–12.5)
Monocytes Relative: 9.7 %
Neutro Abs: 3544 {cells}/uL (ref 1500–7800)
Neutrophils Relative %: 61.1 %
Platelets: 196 10*3/uL (ref 140–400)
RBC: 4.51 10*6/uL (ref 3.80–5.10)
RDW: 13 % (ref 11.0–15.0)
Total Lymphocyte: 23.5 %
WBC: 5.8 10*3/uL (ref 3.8–10.8)

## 2024-05-03 LAB — PROTEIN / CREATININE RATIO, URINE
Creatinine, Urine: 97 mg/dL (ref 20–275)
Protein/Creat Ratio: 72 mg/g{creat} (ref 24–184)
Protein/Creatinine Ratio: 0.072 mg/mg{creat} (ref 0.024–0.184)
Total Protein, Urine: 7 mg/dL (ref 5–24)

## 2024-05-03 LAB — SEDIMENTATION RATE: Sed Rate: 11 mm/h (ref 0–30)

## 2024-05-03 LAB — MICROALBUMIN / CREATININE URINE RATIO
Creatinine, Urine: 99 mg/dL (ref 20–275)
Microalb Creat Ratio: 3 mg/g{creat} (ref <30)
Microalb, Ur: 0.3 mg/dL

## 2024-05-03 LAB — ANTI-DNA ANTIBODY, DOUBLE-STRANDED: ds DNA Ab: <1 [IU]/mL

## 2024-05-04 ENCOUNTER — Ambulatory Visit: Payer: Self-pay | Admitting: Internal Medicine

## 2024-05-09 ENCOUNTER — Encounter: Payer: Self-pay | Admitting: Internal Medicine

## 2024-05-09 MED ORDER — SYNJARDY XR 12.5-1000 MG PO TB24: 2.0000 | ORAL_TABLET | Freq: Every day | ORAL | 3 refills | Status: DC

## 2024-05-09 NOTE — Progress Notes (Deleted)
 Office Visit Note  Patient: Susan Daniels             Date of Birth: September 22, 1956           MRN: [id number]             PCP: Cleatus Arlyss RAMAN, MD Referring: Cleatus Arlyss RAMAN, MD Visit Date: 05/23/2024 Occupation: @GUAROCC @  Subjective:  No chief complaint on file.   History of Present Illness: Susan Daniels is a 68 y.o. female ***     Activities of Daily Living:  Patient reports morning stiffness for *** {minute/hour:19697}.   Patient {ACTIONS;DENIES/REPORTS:21021675::Denies} nocturnal pain.  Difficulty dressing/grooming: {ACTIONS;DENIES/REPORTS:21021675::Denies} Difficulty climbing stairs: {ACTIONS;DENIES/REPORTS:21021675::Denies} Difficulty getting out of chair: {ACTIONS;DENIES/REPORTS:21021675::Denies} Difficulty using hands for taps, buttons, cutlery, and/or writing: {ACTIONS;DENIES/REPORTS:21021675::Denies}  No Rheumatology ROS completed.   PMFS History:  Patient Active Problem List   Diagnosis Date Noted   Facial paresthesia 04/19/2023   Vertigo 04/19/2023   Lymphadenopathy 04/19/2023   Gout 03/30/2023   Healthcare maintenance 03/30/2023   Advance care planning 03/30/2023   Type 2 diabetes mellitus with hyperglycemia, without long-term current use of insulin (HCC) 05/19/2022   CAD (coronary artery disease), native coronary artery 06/16/2020   Autoimmune disease (HCC) 06/03/2017   High risk medication use 06/03/2017   Primary osteoarthritis of both hands 06/03/2017   Primary osteoarthritis of both knees 06/03/2017   Primary osteoarthritis of both feet 06/03/2017   Heart murmur 06/03/2017   History of hypothyroidism 06/03/2017   Dyslipidemia 06/03/2017   Vitamin D  deficiency 06/03/2017    Past Medical History:  Diagnosis Date   Arthritis    Rheumatoid   CAD (coronary artery disease)    s/p stent x1   Diabetes mellitus without complication (HCC)    Headache(784.0)    migraines   Heart murmur     Family History  Problem Relation Age of  Onset   Healthy Mother    Heart disease Father    Rheumatic fever Father    Hypertension Sister    Rheumatic fever Sister    Breast cancer Maternal Aunt    Dementia Maternal Uncle    Rheumatic fever Maternal Grandmother    Heart Problems Maternal Grandmother    Healthy Daughter    Healthy Son    Colon cancer Neg Hx    Past Surgical History:  Procedure Laterality Date   COLONOSCOPY WITH PROPOFOL  N/A 01/30/2013   Procedure: COLONOSCOPY WITH PROPOFOL ;  Surgeon: Gladis MARLA Louder, MD;  Location: [facility];  Service: Endoscopy;  Laterality: N/A;   dental implant  09/29/2018   HEART STENT     TUBAL LIGATION     Social History   Social History Narrative   From [place].   Divorced.   Lives alone.   Enjoys hiking and fishing.   2 children.  1 son in [place], 1 daughter in [place].      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      Are you right handed or left handed? Right Handed    Are you currently employed ? 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 Yes   Who lives with you?    What type of home do you live in: 1 story or 2 story? Lives in a one story home with one room upstairs   Immunization History  Administered Date(s) Administered   Fluad Quad(high Dose 65+) 10/07/2022   Influenza, High  Dose Seasonal PF 09/01/2023   Influenza-Unspecified 08/08/2012, 08/10/2016, 07/09/2017, 07/09/2017, 08/29/2021   PFIZER(Purple Top)SARS-COV-2 Vaccination 01/29/2020, 02/19/2020, 10/10/2020   Pneumococcal Polysaccharide-23 04/08/2021   Tdap 11/08/2014   Zoster Recombinant(Shingrix) 08/30/2022     Objective: Vital Signs: There were no vitals taken for this visit.   Physical Exam   Musculoskeletal Exam: ***  CDAI Exam: CDAI Score: -- Patient Global: --; Provider Global: -- Swollen: --; Tender: -- Joint Exam 05/23/2024   No joint exam has been documented for this visit   There is  currently no information documented on the homunculus. Go to the Rheumatology activity and complete the homunculus joint exam.  Investigation: No additional findings.  Imaging: No results found.  Recent Labs: Lab Results  Component Value Date   WBC 5.8 05/02/2024   HGB 12.9 05/02/2024   PLT 196 05/02/2024   NA 138 05/02/2024   K 4.1 05/02/2024   CL 105 05/02/2024   CO2 27 05/02/2024   GLUCOSE 79 05/02/2024   BUN 18 05/02/2024   CREATININE 0.80 05/02/2024   BILITOT 0.6 05/02/2024   ALKPHOS 52 04/21/2023   AST 14 05/02/2024   ALT 14 05/02/2024   PROT 6.6 05/02/2024   ALBUMIN 4.2 04/21/2023   CALCIUM 9.4 05/02/2024   GFRAA 68 05/05/2021   QFTBGOLDPLUS NEGATIVE 03/02/2021   May 02, 2024 urine protein creatinine ratio normal, dsDNA negative, sed rate 11, complements normal  Speciality Comments: PLQ eye exam: 09/08/2020 [facility] Follow up in 6 months. Abnormal OCT.  Started Plaquenil  at age 53  Procedures:  No procedures performed Allergies: Aspirin, Codeine, Epinephrine, and Hydrocodone   Assessment / Plan:     Visit Diagnoses: No diagnosis found.  Orders: No orders of the defined types were placed in this encounter.  No orders of the defined types were placed in this encounter.   Face-to-face time spent with patient was *** minutes. Greater than 50% of time was spent in counseling and coordination of care.  Follow-Up Instructions: No follow-ups on file.   Maya Nash, MD  Note - This record has been created using Animal nutritionist.  Chart creation errors have been sought, but may not always  have been located. Such creation errors do not reflect on  the standard of medical care.

## 2024-05-23 ENCOUNTER — Ambulatory Visit: Payer: BC Managed Care – PPO | Admitting: Rheumatology

## 2024-05-23 DIAGNOSIS — E785 Hyperlipidemia, unspecified: Secondary | ICD-10-CM

## 2024-05-23 DIAGNOSIS — M1A0710 Idiopathic chronic gout, right ankle and foot, without tophus (tophi): Secondary | ICD-10-CM

## 2024-05-23 DIAGNOSIS — M7072 Other bursitis of hip, left hip: Secondary | ICD-10-CM

## 2024-05-23 DIAGNOSIS — M359 Systemic involvement of connective tissue, unspecified: Secondary | ICD-10-CM

## 2024-05-23 DIAGNOSIS — M19071 Primary osteoarthritis, right ankle and foot: Secondary | ICD-10-CM

## 2024-05-23 DIAGNOSIS — Z8639 Personal history of other endocrine, nutritional and metabolic disease: Secondary | ICD-10-CM

## 2024-05-23 DIAGNOSIS — Z79899 Other long term (current) drug therapy: Secondary | ICD-10-CM

## 2024-05-23 DIAGNOSIS — M170 Bilateral primary osteoarthritis of knee: Secondary | ICD-10-CM

## 2024-05-23 DIAGNOSIS — M19041 Primary osteoarthritis, right hand: Secondary | ICD-10-CM

## 2024-05-23 DIAGNOSIS — H35389 Toxic maculopathy, unspecified eye: Secondary | ICD-10-CM

## 2024-05-23 DIAGNOSIS — R011 Cardiac murmur, unspecified: Secondary | ICD-10-CM

## 2024-05-23 DIAGNOSIS — M8589 Other specified disorders of bone density and structure, multiple sites: Secondary | ICD-10-CM

## 2024-05-25 ENCOUNTER — Other Ambulatory Visit: Payer: Self-pay | Admitting: Physician Assistant

## 2024-05-25 DIAGNOSIS — M359 Systemic involvement of connective tissue, unspecified: Secondary | ICD-10-CM

## 2024-05-29 NOTE — Progress Notes (Unsigned)
 Office Visit Note  Patient: Susan Daniels             Date of Birth: Jun 07, 1956           MRN: [id number]             PCP: Cleatus Arlyss RAMAN, MD Referring: Cleatus Arlyss RAMAN, MD Visit Date: 05/31/2024 Occupation: @GUAROCC @  Subjective:  No chief complaint on file.   History of Present Illness: Susan Daniels is a 68 y.o. female ***     Activities of Daily Living:  Patient reports morning stiffness for *** {minute/hour:19697}.   Patient {ACTIONS;DENIES/REPORTS:21021675::Denies} nocturnal pain.  Difficulty dressing/grooming: {ACTIONS;DENIES/REPORTS:21021675::Denies} Difficulty climbing stairs: {ACTIONS;DENIES/REPORTS:21021675::Denies} Difficulty getting out of chair: {ACTIONS;DENIES/REPORTS:21021675::Denies} Difficulty using hands for taps, buttons, cutlery, and/or writing: {ACTIONS;DENIES/REPORTS:21021675::Denies}  No Rheumatology ROS completed.   PMFS History:  Patient Active Problem List   Diagnosis Date Noted   Facial paresthesia 04/19/2023   Vertigo 04/19/2023   Lymphadenopathy 04/19/2023   Gout 03/30/2023   Healthcare maintenance 03/30/2023   Advance care planning 03/30/2023   Type 2 diabetes mellitus with hyperglycemia, without long-term current use of insulin (HCC) 05/19/2022   CAD (coronary artery disease), native coronary artery 06/16/2020   Autoimmune disease (HCC) 06/03/2017   High risk medication use 06/03/2017   Primary osteoarthritis of both hands 06/03/2017   Primary osteoarthritis of both knees 06/03/2017   Primary osteoarthritis of both feet 06/03/2017   Heart murmur 06/03/2017   History of hypothyroidism 06/03/2017   Dyslipidemia 06/03/2017   Vitamin D  deficiency 06/03/2017    Past Medical History:  Diagnosis Date   Arthritis    Rheumatoid   CAD (coronary artery disease)    s/p stent x1   Diabetes mellitus without complication (HCC)    Headache(784.0)    migraines   Heart murmur     Family History  Problem Relation Age of  Onset   Healthy Mother    Heart disease Father    Rheumatic fever Father    Hypertension Sister    Rheumatic fever Sister    Breast cancer Maternal Aunt    Dementia Maternal Uncle    Rheumatic fever Maternal Grandmother    Heart Problems Maternal Grandmother    Healthy Daughter    Healthy Son    Colon cancer Neg Hx    Past Surgical History:  Procedure Laterality Date   COLONOSCOPY WITH PROPOFOL  N/A 01/30/2013   Procedure: COLONOSCOPY WITH PROPOFOL ;  Surgeon: Gladis MARLA Louder, MD;  Location: [facility];  Service: Endoscopy;  Laterality: N/A;   dental implant  09/29/2018   HEART STENT     TUBAL LIGATION     Social History   Social History Narrative   From [place].   Divorced.   Lives alone.   Enjoys hiking and fishing.   2 children.  1 son in [place] city, 1 daughter in [place].      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      Are you right handed or left handed? Right Handed    Are you currently employed ? 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 Yes   Who lives with you?    What type of home do you live in: 1 story or 2 story? Lives in a one story home with one room upstairs   Immunization History  Administered Date(s) Administered   Fluad Quad(high Dose 65+) 10/07/2022   Influenza, High  Dose Seasonal PF 09/01/2023   Influenza-Unspecified 08/08/2012, 08/10/2016, 07/09/2017, 07/09/2017, 08/29/2021   PFIZER(Purple Top)SARS-COV-2 Vaccination 01/29/2020, 02/19/2020, 10/10/2020   Pneumococcal Polysaccharide-23 04/08/2021   Tdap 11/08/2014   Zoster Recombinant(Shingrix) 08/30/2022     Objective: Vital Signs: There were no vitals taken for this visit.   Physical Exam   Musculoskeletal Exam: ***  CDAI Exam: CDAI Score: -- Patient Global: --; Provider Global: -- Swollen: --; Tender: -- Joint Exam 05/31/2024   No joint exam has been documented for this visit   There is  currently no information documented on the homunculus. Go to the Rheumatology activity and complete the homunculus joint exam.  Investigation: No additional findings.  Imaging: No results found.  Recent Labs: Lab Results  Component Value Date   WBC 5.8 05/02/2024   HGB 12.9 05/02/2024   PLT 196 05/02/2024   NA 138 05/02/2024   K 4.1 05/02/2024   CL 105 05/02/2024   CO2 27 05/02/2024   GLUCOSE 79 05/02/2024   BUN 18 05/02/2024   CREATININE 0.80 05/02/2024   BILITOT 0.6 05/02/2024   ALKPHOS 52 04/21/2023   AST 14 05/02/2024   ALT 14 05/02/2024   PROT 6.6 05/02/2024   ALBUMIN 4.2 04/21/2023   CALCIUM 9.4 05/02/2024   GFRAA 68 05/05/2021   QFTBGOLDPLUS NEGATIVE 03/02/2021   May 02, 2024 urine protein creatinine ratio normal, dsDNA negative, sed rate 11, complements normal  Speciality Comments: PLQ eye exam: 09/08/2020 [facility] Follow up in 6 months. Abnormal OCT.  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  No orders of the defined types were placed in this encounter.   Face-to-face time spent with patient was *** minutes. Greater than 50% of time was spent in counseling and coordination of care.  Follow-Up Instructions: No follow-ups on file.   Waddell CHRISTELLA Craze, PA-C  Note - This record has been created using  Dragon software.  Chart creation errors have been sought, but may not always  have been located. Such creation errors do not reflect on  the standard of medical care.

## 2024-05-31 ENCOUNTER — Ambulatory Visit: Payer: Self-pay | Attending: Physician Assistant | Admitting: Physician Assistant

## 2024-05-31 ENCOUNTER — Encounter: Payer: Self-pay | Admitting: Physician Assistant

## 2024-05-31 VITALS — BP 107/66 | HR 77 | Resp 16 | Ht 62.0 in | Wt 174.4 lb

## 2024-05-31 DIAGNOSIS — M19072 Primary osteoarthritis, left ankle and foot: Secondary | ICD-10-CM | POA: Diagnosis present

## 2024-05-31 DIAGNOSIS — Z79899 Other long term (current) drug therapy: Secondary | ICD-10-CM | POA: Diagnosis not present

## 2024-05-31 DIAGNOSIS — M359 Systemic involvement of connective tissue, unspecified: Secondary | ICD-10-CM | POA: Insufficient documentation

## 2024-05-31 DIAGNOSIS — R011 Cardiac murmur, unspecified: Secondary | ICD-10-CM

## 2024-05-31 DIAGNOSIS — H35389 Toxic maculopathy, unspecified eye: Secondary | ICD-10-CM | POA: Diagnosis not present

## 2024-05-31 DIAGNOSIS — M1A0710 Idiopathic chronic gout, right ankle and foot, without tophus (tophi): Secondary | ICD-10-CM

## 2024-05-31 DIAGNOSIS — T372X5A Adverse effect of antimalarials and drugs acting on other blood protozoa, initial encounter: Secondary | ICD-10-CM

## 2024-05-31 DIAGNOSIS — Z8639 Personal history of other endocrine, nutritional and metabolic disease: Secondary | ICD-10-CM | POA: Diagnosis present

## 2024-05-31 DIAGNOSIS — M170 Bilateral primary osteoarthritis of knee: Secondary | ICD-10-CM | POA: Diagnosis present

## 2024-05-31 DIAGNOSIS — M8589 Other specified disorders of bone density and structure, multiple sites: Secondary | ICD-10-CM | POA: Diagnosis present

## 2024-05-31 DIAGNOSIS — M19041 Primary osteoarthritis, right hand: Secondary | ICD-10-CM | POA: Diagnosis not present

## 2024-05-31 DIAGNOSIS — M19042 Primary osteoarthritis, left hand: Secondary | ICD-10-CM | POA: Diagnosis present

## 2024-05-31 DIAGNOSIS — M19071 Primary osteoarthritis, right ankle and foot: Secondary | ICD-10-CM | POA: Diagnosis present

## 2024-05-31 DIAGNOSIS — E785 Hyperlipidemia, unspecified: Secondary | ICD-10-CM

## 2024-05-31 NOTE — Patient Instructions (Signed)
 Standing Labs We placed an order today for your standing lab work.   Please have your standing labs drawn in September and every 3 months   Please have your labs drawn 2 weeks prior to your appointment so that the provider can discuss your lab results at your appointment, if possible.  Please note that you may see your imaging and lab results in MyChart before we have reviewed them. We will contact you once all results are reviewed. Please allow our office up to 72 hours to thoroughly review all of the results before contacting the office for clarification of your results.  WALK-IN LAB HOURS  Monday through Thursday from 8:00 am -12:30 pm and 1:00 pm-4:30 pm and Friday from 8:00 am-12:00 pm.  Patients with office visits requiring labs will be seen before walk-in labs.  You may encounter longer than normal wait times. Please allow additional time. Wait times may be shorter on  Monday and Thursday afternoons.  We do not book appointments for walk-in labs. We appreciate your patience and understanding with our staff.   Labs are drawn by [facility]. Please bring your co-pay at the time of your lab draw.  You may receive a bill from [facility] for your lab work.  Please note if you are on Hydroxychloroquine and and an order has been placed for a Hydroxychloroquine level,  you will need to have it drawn 4 hours or more after your last dose.  If you wish to have your labs drawn at another location, please call the office 24 hours in advance so we can fax the orders.  The office is located at [address], [address], [place], [place] [address]   If you have any questions regarding directions or hours of operation,  please call [phone].   As a reminder, please drink plenty of water prior to coming for your lab work. Thanks!

## 2024-07-06 ENCOUNTER — Other Ambulatory Visit: Payer: Self-pay | Admitting: Internal Medicine

## 2024-07-06 DIAGNOSIS — E1165 Type 2 diabetes mellitus with hyperglycemia: Secondary | ICD-10-CM

## 2024-07-17 ENCOUNTER — Telehealth: Payer: Self-pay | Admitting: Pharmacy Technician

## 2024-07-17 ENCOUNTER — Other Ambulatory Visit (HOSPITAL_COMMUNITY): Payer: Self-pay

## 2024-07-17 NOTE — Telephone Encounter (Signed)
 Pharmacy Patient Advocate Encounter   Received notification from CoverMyMeds that prior authorization for Ozempic  (1 MG/DOSE) 4MG /3ML pen-injectors is required/requested.   Insurance verification completed.   The patient is insured through [facility] ADVANTAGE/RX ADVANCE .   Per test claim: PA required; PA submitted to above mentioned insurance via Latent Key/confirmation #/EOC [id number] Status is pending

## 2024-07-18 ENCOUNTER — Other Ambulatory Visit (HOSPITAL_COMMUNITY): Payer: Self-pay

## 2024-07-18 NOTE — Telephone Encounter (Signed)
 Pharmacy Patient Advocate Encounter  Received notification from [employer] that Prior Authorization for Ozempic  (1 MG/DOSE) 4MG /3ML pen-injectors  has been APPROVED from 07/17/24 to 07/17/25. Ran test claim, Copay is $0.00. This test claim was processed through [facility]- copay amounts may vary at other pharmacies due to pharmacy/plan contracts, or as the patient moves through the different stages of their insurance plan.   PA #/Case ID/Reference #: [id number]

## 2024-07-19 ENCOUNTER — Telehealth: Payer: Self-pay | Admitting: Family Medicine

## 2024-07-19 NOTE — Telephone Encounter (Signed)
 Patient dropped of form to complete placed in provider box at front desk.

## 2024-07-19 NOTE — Telephone Encounter (Signed)
 Placed in yout tray

## 2024-07-19 NOTE — Telephone Encounter (Signed)
 I will work on [employer].  Thanks.

## 2024-07-25 ENCOUNTER — Other Ambulatory Visit: Payer: Self-pay | Admitting: Physician Assistant

## 2024-07-25 DIAGNOSIS — M359 Systemic involvement of connective tissue, unspecified: Secondary | ICD-10-CM

## 2024-07-25 DIAGNOSIS — M1A0710 Idiopathic chronic gout, right ankle and foot, without tophus (tophi): Secondary | ICD-10-CM

## 2024-07-25 NOTE — Telephone Encounter (Unsigned)
 Copied from CRM [id number]. Topic: General - Other >> Jul 24, 2024  4:45 PM Delon DASEN wrote: Reason for CRM: Patient asking of document is ready for pick up- please text or call when ready [phone]

## 2024-07-25 NOTE — Telephone Encounter (Signed)
 Last Fill: 05/02/2024  Labs: 05/02/2024 CBC and CMP WNL Urine protein creatinine ratio WNL ESR WNL dsDNA negative  Labs are not consistent with a flare  12/08/2023 Uric Acid 3.9  Next Visit: 11/05/2024  Last Visit: 05/31/2024  DX: Autoimmune disease (HCC), Chronic idiopathic gout involving toe of right foot without tophus   Current Dose per office note 05/31/2024: Arava  10 mg 1 tablet by mouth daily, allopurinol  100 mg daily   Contacted the patient and advised CBC, CMP, and Uric acid are due. Patient states she can come in to do labs on 08/06/2024. CBC and CMP already in order review. Please review and sign uric acid.   Okay to refill Allopurinol  and Arava ?

## 2024-07-26 ENCOUNTER — Telehealth: Payer: Self-pay

## 2024-07-26 NOTE — Telephone Encounter (Signed)
 Copied from CRM [id number]. Topic: General - Other >> Jul 24, 2024  4:45 PM Delon DASEN wrote: Reason for CRM: Patient asking of document is ready for pick up- please text or call when ready [phone] >> Jul 26, 2024  9:19 AM Thersia C wrote: Patient called in regarding paperwork, wanted to know if it was ready. Did inform patient about timeframe of paperwork , she would like a callback regarding this

## 2024-07-26 NOTE — Telephone Encounter (Unsigned)
 Copied from CRM [id number]. Topic: General - Other >> Jul 24, 2024  4:45 PM Delon DASEN wrote: Reason for CRM: Patient asking of document is ready for pick up- please text or call when ready [phone] >> Jul 26, 2024  9:19 AM Thersia C wrote: Patient called in regarding paperwork, wanted to know if it was ready. Did inform patient about timeframe of paperwork , she would like a callback regarding this

## 2024-07-26 NOTE — Telephone Encounter (Signed)
 Have you completed paperwork? Please advise

## 2024-07-26 NOTE — Telephone Encounter (Signed)
 All paperwork from last week was completed and handed in on Monday.

## 2024-07-27 NOTE — Telephone Encounter (Signed)
 Reached out to patient and advised that I did fax the paperwork that she emailed me.

## 2024-07-27 NOTE — Telephone Encounter (Signed)
 Spoke with patient unable to locate documentation not sure if someone sent it to the scan center or not. Patient will email me the documentation and I will have document signed by provider and fax on her behalf. Patient understood.

## 2024-08-03 DIAGNOSIS — H04302 Unspecified dacryocystitis of left lacrimal passage: Secondary | ICD-10-CM | POA: Diagnosis not present

## 2024-08-10 ENCOUNTER — Telehealth: Payer: Self-pay | Admitting: Dietician

## 2024-08-10 NOTE — Telephone Encounter (Signed)
 Patient advised and will start taking the medication.

## 2024-08-10 NOTE — Telephone Encounter (Signed)
 Patient called.  Her pharmacy has filled both the Synjardy  and the Synjardy  XR.  She wishes to know which she should take and if the frequency should change. Will message MD  Leita Constable, RD, LDN, CDCES, DipACLM

## 2024-08-13 ENCOUNTER — Other Ambulatory Visit: Payer: Self-pay | Admitting: Family Medicine

## 2024-08-13 NOTE — Telephone Encounter (Unsigned)
 Copied from CRM #[id number]. Topic: Clinical - Medication Refill >> Aug 13, 2024 10:11 AM Donna BRAVO wrote: Patient has medication follow up appt on 08/20/24 patient would like enough medication to hold over until appt   Medication: rosuvastatin (CRESTOR) 10 MG tablet  Has the patient contacted their pharmacy? No Patient has no refills  This is the patient's preferred pharmacy:   [id number] [place],  - [address] [place] [address] Phone: [phone] Fax: [phone]  Is this the correct pharmacy for this prescription? Yes If no, delete pharmacy and type the correct one.   Has the prescription been filled recently? No  Is the patient out of the medication? Yes  Has the patient been seen for an appointment in the last year OR does the patient have an upcoming appointment? Yes    Can we respond through MyChart? Yes  Agent: Please be advised that Rx refills may take up to 3 business days. We ask that you follow-up with your pharmacy.

## 2024-08-13 NOTE — Telephone Encounter (Signed)
 LOV: 01/10/2024 NOV: 08/20/2024  Last Refill: rosuvastatin (CRESTOR) 10 MG tablet 06/17/24

## 2024-08-14 MED ORDER — ROSUVASTATIN CALCIUM 10 MG PO TABS: 5.0000 mg | ORAL_TABLET | Freq: Every day | ORAL | 3 refills | Status: AC

## 2024-08-18 ENCOUNTER — Other Ambulatory Visit: Payer: Self-pay | Admitting: Physician Assistant

## 2024-08-18 DIAGNOSIS — M359 Systemic involvement of connective tissue, unspecified: Secondary | ICD-10-CM

## 2024-08-20 ENCOUNTER — Ambulatory Visit: Payer: Self-pay | Admitting: Family Medicine

## 2024-08-22 ENCOUNTER — Other Ambulatory Visit: Payer: Self-pay | Admitting: Physician Assistant

## 2024-08-22 DIAGNOSIS — M359 Systemic involvement of connective tissue, unspecified: Secondary | ICD-10-CM

## 2024-08-27 ENCOUNTER — Other Ambulatory Visit: Payer: Self-pay | Admitting: *Deleted

## 2024-08-27 DIAGNOSIS — M359 Systemic involvement of connective tissue, unspecified: Secondary | ICD-10-CM

## 2024-08-27 DIAGNOSIS — M1A0710 Idiopathic chronic gout, right ankle and foot, without tophus (tophi): Secondary | ICD-10-CM | POA: Diagnosis not present

## 2024-08-28 ENCOUNTER — Ambulatory Visit: Payer: Self-pay | Admitting: Physician Assistant

## 2024-08-28 ENCOUNTER — Ambulatory Visit: Payer: Self-pay | Admitting: Rheumatology

## 2024-08-28 LAB — PROTEIN / CREATININE RATIO, URINE
Creatinine, Urine: 53 mg/dL (ref 20–275)
Protein/Creat Ratio: 75 mg/g{creat} (ref 24–184)
Protein/Creatinine Ratio: 0.075 mg/mg{creat} (ref 0.024–0.184)
Total Protein, Urine: 4 mg/dL — ABNORMAL LOW (ref 5–24)

## 2024-08-28 LAB — CBC WITH DIFFERENTIAL/PLATELET
Absolute Lymphocytes: 1283 {cells}/uL (ref 850–3900)
Absolute Monocytes: 546 {cells}/uL (ref 200–950)
Basophils Absolute: 42 {cells}/uL (ref 0–200)
Basophils Relative: 0.8 %
Eosinophils Absolute: 239 {cells}/uL (ref 15–500)
Eosinophils Relative: 4.5 %
HCT: 40.1 % (ref 35.0–45.0)
Hemoglobin: 13.5 g/dL (ref 11.7–15.5)
MCH: 29.4 pg (ref 27.0–33.0)
MCHC: 33.7 g/dL (ref 32.0–36.0)
MCV: 87.4 fL (ref 80.0–100.0)
MPV: 12.4 fL (ref 7.5–12.5)
Monocytes Relative: 10.3 %
Neutro Abs: 3191 {cells}/uL (ref 1500–7800)
Neutrophils Relative %: 60.2 %
Platelets: 220 Thousand/uL (ref 140–400)
RBC: 4.59 Million/uL (ref 3.80–5.10)
RDW: 12.9 % (ref 11.0–15.0)
Total Lymphocyte: 24.2 %
WBC: 5.3 Thousand/uL (ref 3.8–10.8)

## 2024-08-28 LAB — COMPREHENSIVE METABOLIC PANEL WITH GFR
AG Ratio: 1.9 (calc) (ref 1.0–2.5)
ALT: 15 U/L (ref 6–29)
AST: 13 U/L (ref 10–35)
Albumin: 4.7 g/dL (ref 3.6–5.1)
Alkaline phosphatase (APISO): 58 U/L (ref 37–153)
BUN: 19 mg/dL (ref 7–25)
CO2: 27 mmol/L (ref 20–32)
Calcium: 10.1 mg/dL (ref 8.6–10.4)
Chloride: 104 mmol/L (ref 98–110)
Creat: 0.95 mg/dL (ref 0.50–1.05)
Globulin: 2.5 g/dL (ref 1.9–3.7)
Glucose, Bld: 103 mg/dL — ABNORMAL HIGH (ref 65–99)
Potassium: 4.4 mmol/L (ref 3.5–5.3)
Sodium: 140 mmol/L (ref 135–146)
Total Bilirubin: 0.6 mg/dL (ref 0.2–1.2)
Total Protein: 7.2 g/dL (ref 6.1–8.1)
eGFR: 65 mL/min/1.73m2 (ref >=60)

## 2024-08-28 LAB — ANTI-DNA ANTIBODY, DOUBLE-STRANDED: ds DNA Ab: <1 [IU]/mL

## 2024-08-28 LAB — C3 AND C4
C3 Complement: 159 mg/dL (ref 83–193)
C4 Complement: 27 mg/dL (ref 15–57)

## 2024-08-28 LAB — SEDIMENTATION RATE: Sed Rate: 19 mm/h (ref 0–30)

## 2024-08-28 LAB — URIC ACID: Uric Acid, Serum: 3.9 mg/dL (ref 2.5–7.0)

## 2024-08-28 NOTE — Progress Notes (Signed)
Uric acid WNL

## 2024-08-28 NOTE — Progress Notes (Signed)
 Urine protein creatinine ratio normal, CBC normal, CMP normal, sed rate normal, complements normal, double-stranded DNA pending.

## 2024-08-29 NOTE — Progress Notes (Signed)
 Urine protein creatinine ratio normal, glucose is mildly elevated probably not a fasting sample.  Sed rate normal, complements normal, anti-DNA negative, CBC normal, labs do not indicate an autoimmune disease flare.

## 2024-09-03 ENCOUNTER — Ambulatory Visit: Payer: Self-pay | Admitting: Family Medicine

## 2024-09-03 ENCOUNTER — Encounter: Payer: Self-pay | Admitting: Family Medicine

## 2024-09-03 VITALS — BP 138/80 | HR 80 | Temp 98.5°F | Ht 61.81 in | Wt 176.0 lb

## 2024-09-03 DIAGNOSIS — M542 Cervicalgia: Secondary | ICD-10-CM | POA: Diagnosis not present

## 2024-09-03 DIAGNOSIS — E1165 Type 2 diabetes mellitus with hyperglycemia: Secondary | ICD-10-CM | POA: Diagnosis not present

## 2024-09-03 MED ORDER — DICLOFENAC SODIUM 1 % EX GEL: 2.0000 g | Freq: Four times a day (QID) | CUTANEOUS | Status: AC | PRN

## 2024-09-03 NOTE — Patient Instructions (Addendum)
 I would gently stretch. Try ice vs heat.  Try OTC icy hot or similar.   You could try OTC diclofenac  gel if need.  Update me as needed.  Take care.  Glad to see you. Exercise as tolerated.

## 2024-09-03 NOTE — Progress Notes (Unsigned)
 D/w pt about recent labs per rheum. She has endo f/u pending re: Dm2.  No recent gout flares.  Sugar has been <200, 120-160s.  No lows.    She had eye appointment with Dr. Charmayne 2025.    She had a flu shot.   No CP.  Some R posterior neck pain.  Not midline, R occiput.  Sore for the lats 4 days.  Positional pain.  Using heating pad and taking tylenol .  No trauma.  Had been working out at [employer].  No arm sx.  No L sided sx.  No rash.  Tylenol  helps temporarily.  She has frequently changed pillows at baseline.    Meds, vitals, and allergies reviewed.   ROS: Per HPI unless specifically indicated in ROS section   No LA Pain with neck flexion but not extension.  Normal rotation.

## 2024-09-04 ENCOUNTER — Ambulatory Visit: Admitting: Internal Medicine

## 2024-09-06 ENCOUNTER — Other Ambulatory Visit: Payer: Self-pay | Admitting: Physician Assistant

## 2024-09-06 DIAGNOSIS — M359 Systemic involvement of connective tissue, unspecified: Secondary | ICD-10-CM

## 2024-09-06 DIAGNOSIS — M542 Cervicalgia: Secondary | ICD-10-CM | POA: Insufficient documentation

## 2024-09-06 NOTE — Assessment & Plan Note (Signed)
 D/w pt about recent labs per rheum. She has endo f/u pending re: Dm2.  No change in meds today. No recent gout flares.  Sugar has been <200, 120-160s.  No lows.    She had eye appointment with Dr. Charmayne 2025.    She had a flu shot.

## 2024-09-06 NOTE — Telephone Encounter (Signed)
 Last Fill: 07/25/2024 (30 day supply)  Labs: 08/27/2024 Urine protein creatinine ratio normal, CBC normal, CMP normal, sed rate normal, complements normal   Next Visit: 11/05/2024  Last Visit: 05/31/2024  DX:  Autoimmune disease   Current Dose per office note 05/31/2024: Arava  10 mg 1 tablet by mouth daily   Okay to refill Arava  ?

## 2024-09-06 NOTE — Assessment & Plan Note (Signed)
 I would gently stretch.  She can try ice vs heat.  Try OTC icy hot or similar.   She could try OTC diclofenac  gel if need.  Update me as needed.  Exercise as tolerated.

## 2024-09-11 ENCOUNTER — Ambulatory Visit: Admitting: Internal Medicine

## 2024-09-12 ENCOUNTER — Ambulatory Visit: Admitting: Internal Medicine

## 2024-09-12 ENCOUNTER — Encounter: Payer: Self-pay | Admitting: Internal Medicine

## 2024-09-12 VITALS — BP 138/70 | Ht 61.0 in | Wt 175.0 lb

## 2024-09-12 DIAGNOSIS — E1159 Type 2 diabetes mellitus with other circulatory complications: Secondary | ICD-10-CM | POA: Diagnosis not present

## 2024-09-12 DIAGNOSIS — Z794 Long term (current) use of insulin: Secondary | ICD-10-CM | POA: Diagnosis not present

## 2024-09-12 DIAGNOSIS — E1165 Type 2 diabetes mellitus with hyperglycemia: Secondary | ICD-10-CM | POA: Diagnosis not present

## 2024-09-12 LAB — POCT GLYCOSYLATED HEMOGLOBIN (HGB A1C): Hemoglobin A1C: 7.4 % — AB (ref 4.0–5.6)

## 2024-09-12 MED ORDER — SYNJARDY XR 12.5-1000 MG PO TB24: 2.0000 | ORAL_TABLET | Freq: Every day | ORAL | 3 refills | Status: AC

## 2024-09-12 MED ORDER — SEMAGLUTIDE (2 MG/DOSE) 8 MG/3ML ~~LOC~~ SOPN: 2.0000 mg | PEN_INJECTOR | SUBCUTANEOUS | 3 refills | Status: AC

## 2024-09-12 NOTE — Patient Instructions (Addendum)

## 2024-09-12 NOTE — Progress Notes (Signed)
 Name: Susan Daniels  MRN/ DOB: [id number], 11-12-1955   Age/ Sex: 68 y.o., female    PCP: Cleatus Arlyss RAMAN, MD   Reason for Endocrinology Evaluation: Type 2 Diabetes Mellitus     Date of Initial Endocrinology Visit: 02/03/2022    PATIENT IDENTIFIER: [name] is a 68 y.o. female with a past medical history of T2DM, autoimmune disease. The patient presented for initial endocrinology clinic visit on 02/03/2022 for consultative assistance with her diabetes management.    HPI: [name] was    Diagnosed with DM ~ 10 yrs  Prior Medications tried/Intolerance: Was on insulin . 2020 after discharge for MI . Started Ozempic  2021          Hemoglobin A1c  ranging from 8.0 %, peaking at 9.8% in 2023.   She follows with Dr. Gollan ( cardiology)  She has noted abdominal pain with the urge to vomit  the day following ozempic  , its short lived  Glimepiride  started 04/2023 with an A1c of 8.0%  SUBJECTIVE:   During the last visit (05/02/2024): A1c 7.9 %   Today (09/12/24): [name] is here for a follow up on diabetes management   She checks checks her blood sugars occasionally. The patient has not had hypoglycemic episodes since the last clinic visit.   Patient follows with rheumatology for autoimmune disease and osteoarthritis  She follows with cardiology m for CAD and dyslipidemia Weight has been stable No nausea or vomiting  No constipation but has some diarrhea episodes this week  Took tylenol  for neck pain which she attributes the diarrhea to     HOME DIABETES REGIMEN: Glimepiride  2 mg daily Synjardy  12.03-999 mg  BID  Ozempic  1 mg weekly      Statin: yes ACE-I/ARB: yes   METER DOWNLOAD SUMMARY: 10/22 - 09/12/2024 Average 147 Highest 165 Lowest 120   DIABETIC COMPLICATIONS: Microvascular complications:   Denies: CKD, retinopathy  Last eye exam: Completed 2025  Macrovascular complications:  CAD Denies: PVD, CVA   PAST HISTORY: Past  Medical History:  Past Medical History:  Diagnosis Date   Arthritis    Rheumatoid   CAD (coronary artery disease)    s/p stent x1   Diabetes mellitus without complication (HCC)    Headache(784.0)    migraines   Heart murmur    Past Surgical History:  Past Surgical History:  Procedure Laterality Date   COLONOSCOPY WITH PROPOFOL  N/A 01/30/2013   Procedure: COLONOSCOPY WITH PROPOFOL ;  Surgeon: Gladis MARLA Louder, MD;  Location: [facility];  Service: Endoscopy;  Laterality: N/A;   dental implant  09/29/2018   HEART STENT     TUBAL LIGATION      Social History:  reports that she quit smoking about 44 years ago. Her smoking use included cigarettes. She started smoking about 51 years ago. She has a 3.5 pack-year smoking history. She has never been exposed to tobacco smoke. She has never used smokeless tobacco. She reports current alcohol use. She reports that she does not use drugs. Family History:  Family History  Problem Relation Age of Onset   Healthy Mother    Heart disease Father    Rheumatic fever Father    Hypertension Sister    Rheumatic fever Sister    Breast cancer Maternal Aunt    Dementia Maternal Uncle    Rheumatic fever Maternal Grandmother    Heart Problems Maternal Grandmother    Healthy Daughter    Healthy Son    Colon cancer Neg  Hx      HOME MEDICATIONS: Allergies as of 09/12/2024       Reactions   Aspirin    Can not take high doses of aspirin- h/o heart racing with use    Codeine    Altered mental status.    Epinephrine    Hydrocodone Nausea And Vomiting        Medication List        Accurate as of September 12, 2024 10:46 AM. If you have any questions, ask your nurse or doctor.          allopurinol  100 MG tablet Commonly known as: ZYLOPRIM  TAKE 1 TABLET BY MOUTH EVERY DAY   aspirin 81 MG chewable tablet Chew 81 mg by mouth daily.   B-D ULTRA-FINE 33 LANCETS Misc Use as directed to test blood glucose   OneTouch Delica Plus Lancet33G  Misc 1 EACH BY OTHER ROUTE 2 (TWO) TIMES DAILY.   CALCIUM PO Take by mouth daily.   Cholecalciferol 50 MCG (2000 UT) Caps 1 capsule.   diclofenac  Sodium 1 % Gel Commonly known as: VOLTAREN  Apply 2 g topically 4 (four) times daily as needed.   glimepiride  2 MG tablet Commonly known as: AMARYL  Take 1 tablet (2 mg total) by mouth daily before breakfast.   leflunomide  10 MG tablet Commonly known as: ARAVA  TAKE 1 TABLET BY MOUTH EVERY DAY   lisinopril  2.5 MG tablet Commonly known as: ZESTRIL  TAKE 1 TABLET BY MOUTH EVERY DAY   metoprolol  tartrate 25 MG tablet Commonly known as: LOPRESSOR  Take 0.5 tablets (12.5 mg total) by mouth 2 (two) times daily.   nitroGLYCERIN  0.4 MG SL tablet Commonly known as: NITROSTAT  Place 1 tablet (0.4 mg total) under the tongue as needed.   OneTouch Verio Flex System w/Device Kit USE IN THE MORNING, AT NOON, AND AT BEDTIME.   OneTouch Verio test strip Generic drug: glucose blood USE AS INSTRUCTED   Ozempic  (1 MG/DOSE) 4 MG/3ML Sopn Generic drug: Semaglutide  (1 MG/DOSE) 1 MG BY OTHER ROUTE ONCE A WEEK.   rosuvastatin 10 MG tablet Commonly known as: CRESTOR Take 0.5 tablets (5 mg total) by mouth at bedtime.   Synjardy  XR 12.03-999 MG Tb24 Generic drug: Empagliflozin -metFORMIN  HCl ER Take 2 tablets by mouth daily.   ZyrTEC Allergy 10 MG tablet Generic drug: cetirizine as needed.         ALLERGIES: Allergies  Allergen Reactions   Aspirin     Can not take high doses of aspirin- h/o heart racing with use    Codeine     Altered mental status.    Epinephrine    Hydrocodone Nausea And Vomiting     REVIEW OF SYSTEMS: A comprehensive ROS was conducted with the patient and is negative except as per HPI    OBJECTIVE:   VITAL SIGNS: BP 138/70   Ht 5' 1 (1.549 m)   Wt 175 lb (79.4 kg)   BMI 33.07 kg/m      Filed Weights   09/12/24 1042  Weight: 175 lb (79.4 kg)     PHYSICAL EXAM:  General: Pt appears well and is in  NAD  Lungs: Clear with good BS bilat   Heart: RRR , + systolic murmur   Extremities:  Lower extremities - No pretibial edema  Neuro: MS is good with appropriate affect, pt is alert and Ox3    DM foot exam: 09/12/2024  The skin of the feet is intact without sores or ulcerations. The pedal pulses are 2+  on right and 2+ on left. The sensation is intact to a screening 5.07, 10 gram monofilament bilaterally    DATA REVIEWED:  Lab Results  Component Value Date   HGBA1C 7.4 (A) 09/12/2024   HGBA1C 7.9 (A) 05/02/2024   HGBA1C 7.7 (A) 10/03/2023    Latest Reference Range & Units 08/27/24 14:03  Sodium 135 - 146 mmol/L 140  Potassium 3.5 - 5.3 mmol/L 4.4  Chloride 98 - 110 mmol/L 104  CO2 20 - 32 mmol/L 27  Glucose 65 - 99 mg/dL 896 (H)  BUN 7 - 25 mg/dL 19  Creatinine 9.49 - 8.94 mg/dL 9.04  Calcium 8.6 - 89.5 mg/dL 89.8  BUN/Creatinine Ratio 6 - 22 (calc) SEE NOTE:  eGFR > OR = 60 mL/min/1.47m2 65  AG Ratio 1.0 - 2.5 (calc) 1.9  Uric Acid, Serum 2.5 - 7.0 mg/dL 3.9  AST 10 - 35 U/L 13  ALT 6 - 29 U/L 15  Total Protein 6.1 - 8.1 g/dL 7.2  Total Bilirubin 0.2 - 1.2 mg/dL 0.6     Latest Reference Range & Units 08/27/24 14:03  Total Protein, Urine 5 - 24 mg/dL 4 (L)  Creatinine, Urine 20 - 275 mg/dL 53  Protein/Creat Ratio 24 - 184 mg/g creat 75    ASSESSMENT / PLAN / RECOMMENDATIONS:   1) Type 2 Diabetes Mellitus,Sub-optimally  controlled, With Macrovascular  complications - Most recent A1c of 7.4 %. Goal A1c < 7.0 %.    - A1c is trending down but continues to be above goal -Patient is skeptical about increasing Ozempic  to 2 mg, as she knows someone who developed side effects, I did encourage patient to try the higher dose of Ozempic  as her A1c is not at goal yet and her weight has plateaued.  I did ask her that she can dial the Ozempic  2 mg pens three quarters of the way for few weeks, if no side effects, she may take a full 2 mg dose - Labs to rheumatology have been  reviewed  MEDICATIONS: Continue Synjardy  12.03-999 mg, 2 tabs daily Increase Ozempic  2 mg weekly Continue glimepiride  2 mg, 1 tablet before Breakfast   EDUCATION / INSTRUCTIONS: BG monitoring instructions: Patient is instructed to check her blood sugars 1 times a day Call [facility] if: BG persistently < 70 I reviewed the Rule of 15 for the treatment of hypoglycemia in detail with the patient. Literature supplied.   2) Diabetic complications:  Eye: Does not have known diabetic retinopathy.  Neuro/ Feet: Does not have known diabetic peripheral neuropathy. Renal: Patient does not have known baseline CKD. She is  on an ACEI/ARB at present.   3) CAD/Dyslipidemia:   - Per cardiology  F/U in 6 months    Signed electronically by: Stefano Redgie Butts, MD  [facility]  [facility] [address] [place] [place], [place] [address] Phone: [phone] FAX: [phone]   CC: Cleatus Arlyss RAMAN, MD [address] [place] [address] Phone: [phone]  Fax: [phone]    Return to Endocrinology clinic as below: Future Appointments  Date Time Provider Department Center  11/05/2024 10:10 AM Cheryl Waddell HERO, PA-C [facility] None  01/04/2025  8:30 AM [facility] LAB [facility] [address]  01/11/2025 11:00 AM Cleatus Arlyss RAMAN, MD [facility] [address]

## 2024-09-26 ENCOUNTER — Other Ambulatory Visit: Payer: Self-pay | Admitting: Physician Assistant

## 2024-09-26 NOTE — Telephone Encounter (Signed)
 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

## 2024-10-13 DIAGNOSIS — J069 Acute upper respiratory infection, unspecified: Secondary | ICD-10-CM | POA: Diagnosis not present

## 2024-10-22 NOTE — Progress Notes (Deleted)
 Office Visit Note  Patient: Susan Daniels             Date of Birth: 1956/04/21           MRN: [id number]             PCP: Cleatus Arlyss RAMAN, MD Referring: Cleatus Arlyss RAMAN, MD Visit Date: 11/05/2024 Occupation: Data Unavailable  Subjective:  No chief complaint on file.   History of Present Illness: Susan Daniels is a 68 y.o. female ***     Activities of Daily Living:  Patient reports morning stiffness for *** {minute/hour:19697}.   Patient {ACTIONS;DENIES/REPORTS:21021675::Denies} nocturnal pain.  Difficulty dressing/grooming: {ACTIONS;DENIES/REPORTS:21021675::Denies} Difficulty climbing stairs: {ACTIONS;DENIES/REPORTS:21021675::Denies} Difficulty getting out of chair: {ACTIONS;DENIES/REPORTS:21021675::Denies} Difficulty using hands for taps, buttons, cutlery, and/or writing: {ACTIONS;DENIES/REPORTS:21021675::Denies}  No Rheumatology ROS completed.   PMFS History:  Patient Active Problem List   Diagnosis Date Noted   Neck pain 09/06/2024   Facial paresthesia 04/19/2023   Vertigo 04/19/2023   Lymphadenopathy 04/19/2023   Gout 03/30/2023   Healthcare maintenance 03/30/2023   Advance care planning 03/30/2023   Type 2 diabetes mellitus with hyperglycemia, without long-term current use of insulin (HCC) 05/19/2022   CAD (coronary artery disease), native coronary artery 06/16/2020   Autoimmune disease 06/03/2017   High risk medication use 06/03/2017   Primary osteoarthritis of both hands 06/03/2017   Primary osteoarthritis of both knees 06/03/2017   Primary osteoarthritis of both feet 06/03/2017   Heart murmur 06/03/2017   History of hypothyroidism 06/03/2017   Dyslipidemia 06/03/2017   Vitamin D  deficiency 06/03/2017    Past Medical History:  Diagnosis Date   Arthritis    Rheumatoid   CAD (coronary artery disease)    s/p stent x1   Diabetes mellitus without complication (HCC)    Headache(784.0)    migraines   Heart murmur     Family History   Problem Relation Age of Onset   Healthy Mother    Heart disease Father    Rheumatic fever Father    Hypertension Sister    Rheumatic fever Sister    Breast cancer Maternal Aunt    Dementia Maternal Uncle    Rheumatic fever Maternal Grandmother    Heart Problems Maternal Grandmother    Healthy Daughter    Healthy Son    Colon cancer Neg Hx    Past Surgical History:  Procedure Laterality Date   COLONOSCOPY WITH PROPOFOL  N/A 01/30/2013   Procedure: COLONOSCOPY WITH PROPOFOL ;  Surgeon: Gladis MARLA Louder, MD;  Location: [facility];  Service: Endoscopy;  Laterality: N/A;   dental implant  09/29/2018   HEART STENT     TUBAL LIGATION     Social History[1] Social History   Social History Narrative   From [place].   Divorced.   Lives alone.   Enjoys hiking and fishing.   2 children.  1 son in [place], 1 daughter in [place].      Retired hotel manager, retired from [employer]      Are you right handed or left handed? Right Handed    Are you currently employed ? 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 Yes   Who lives with you?    What type of home do you live in: 1 story or 2 story? Lives in a one story home with one room upstairs     Immunization History  Administered Date(s) Administered   Fluad Quad(high Dose  65+) 10/07/2022   INFLUENZA, HIGH DOSE SEASONAL PF 09/01/2023, 08/14/2024   Influenza-Unspecified 08/08/2012, 08/10/2016, 07/09/2017, 07/09/2017, 08/29/2021   PFIZER(Purple Top)SARS-COV-2 Vaccination 01/29/2020, 02/19/2020, 10/10/2020   Pneumococcal Polysaccharide-23 04/08/2021   Tdap 11/08/2014   Zoster Recombinant(Shingrix) 08/30/2022     Objective: Vital Signs: There were no vitals taken for this visit.   Physical Exam   Musculoskeletal Exam: ***  CDAI Exam: CDAI Score: -- Patient Global: --; Provider Global: -- Swollen: --; Tender: -- Joint Exam 11/05/2024   No joint exam has been  documented for this visit   There is currently no information documented on the homunculus. Go to the Rheumatology activity and complete the homunculus joint exam.  Investigation: No additional findings.  Imaging: No results found.  Recent Labs: Lab Results  Component Value Date   WBC 5.3 08/27/2024   HGB 13.5 08/27/2024   PLT 220 08/27/2024   NA 140 08/27/2024   K 4.4 08/27/2024   CL 104 08/27/2024   CO2 27 08/27/2024   GLUCOSE 103 (H) 08/27/2024   BUN 19 08/27/2024   CREATININE 0.95 08/27/2024   BILITOT 0.6 08/27/2024   ALKPHOS 52 04/21/2023   AST 13 08/27/2024   ALT 15 08/27/2024   PROT 7.2 08/27/2024   ALBUMIN 4.2 04/21/2023   CALCIUM  10.1 08/27/2024   GFRAA 68 05/05/2021   QFTBGOLDPLUS NEGATIVE 03/02/2021    Speciality Comments: PLQ eye exam: 09/08/2020 W[facility]. Abnormal OCT.  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  No orders of the defined types were placed in this encounter.   Face-to-face time spent with patient was *** minutes. Greater than 50% of time was spent in counseling and coordination of care.  Follow-Up Instructions: No follow-ups on file.   Waddell CHRISTELLA Craze, PA-C  Note - This record has been created using Dragon software.  Chart creation errors have been sought, but may  not always  have been located. Such creation errors do not reflect on  the standard of medical care.     [1]  Social History Tobacco Use   Smoking status: Former    Current packs/day: 0.00    Average packs/day: 0.5 packs/day for 7.0 years (3.5 ttl pk-yrs)    Types: Cigarettes    Start date: 11/08/1972    Quit date: 11/09/1979    Years since quitting: 44.9    Passive exposure: Never   Smokeless tobacco: Never  Vaping Use   Vaping status: Never Used  Substance Use Topics   Alcohol use: Yes    Comment: occasionally    Drug use: Never

## 2024-10-24 LAB — HM MAMMOGRAPHY

## 2024-10-26 ENCOUNTER — Encounter: Payer: Self-pay | Admitting: Family Medicine

## 2024-10-29 ENCOUNTER — Other Ambulatory Visit: Payer: Self-pay | Admitting: Physician Assistant

## 2024-10-29 NOTE — Telephone Encounter (Signed)
 Last Fill: 09/26/2024  Labs: 08/27/2024 Urine protein creatinine ratio normal, glucose is mildly elevated probably not a fasting sample. Sed rate normal, complements normal, anti-DNA negative, CBC normal, labs do not indicate an autoimmune disease flare.  Uric Acid: 08/27/2024 Uric acid WNL   Next Visit: 11/05/2024  Last Visit: 05/31/2024  DX: Chronic idiopathic gout involving toe of right foot without tophus   Current Dose per office note 05/31/2024:  allopurinol  100 mg daily.   Okay to refill Allopurinol ?

## 2024-10-31 ENCOUNTER — Ambulatory Visit: Payer: Self-pay | Admitting: Family Medicine

## 2024-11-05 ENCOUNTER — Ambulatory Visit: Admitting: Physician Assistant

## 2024-11-13 ENCOUNTER — Other Ambulatory Visit: Payer: Self-pay | Admitting: Family Medicine

## 2024-11-15 ENCOUNTER — Encounter: Payer: Self-pay | Admitting: Family Medicine

## 2024-11-18 ENCOUNTER — Ambulatory Visit: Payer: Self-pay | Admitting: Family Medicine

## 2024-12-01 ENCOUNTER — Other Ambulatory Visit: Payer: Self-pay | Admitting: Physician Assistant

## 2024-12-01 DIAGNOSIS — M359 Systemic involvement of connective tissue, unspecified: Secondary | ICD-10-CM

## 2024-12-03 NOTE — Telephone Encounter (Signed)
 Last Fill: 09/06/2024  Labs: 08/27/2024 Urine protein creatinine ratio normal, glucose is mildly elevated probably not a fasting sample. Sed rate normal, complements normal, anti-DNA negative, CBC normal, labs do not indicate an autoimmune disease flare.   Next Visit: 12/14/2024  Last Visit: 05/31/2024  DX: Autoimmune disease (HCC):+ANA   Current Dose per office note on 05/31/2024: Arava  10 mg 1 tablet by mouth daily   Labs are pended to update at upcoming appointment.   Okay to refill Arava  ?

## 2024-12-14 ENCOUNTER — Ambulatory Visit: Admitting: Physician Assistant

## 2024-12-14 ENCOUNTER — Telehealth: Payer: Self-pay

## 2024-12-14 ENCOUNTER — Encounter: Payer: Self-pay | Admitting: Physician Assistant

## 2024-12-14 VITALS — BP 129/79 | HR 93 | Temp 97.5°F | Resp 14 | Ht 62.0 in | Wt 171.6 lb

## 2024-12-14 DIAGNOSIS — E785 Hyperlipidemia, unspecified: Secondary | ICD-10-CM

## 2024-12-14 DIAGNOSIS — R011 Cardiac murmur, unspecified: Secondary | ICD-10-CM

## 2024-12-14 DIAGNOSIS — M19041 Primary osteoarthritis, right hand: Secondary | ICD-10-CM

## 2024-12-14 DIAGNOSIS — H35389 Toxic maculopathy, unspecified eye: Secondary | ICD-10-CM

## 2024-12-14 DIAGNOSIS — Z79899 Other long term (current) drug therapy: Secondary | ICD-10-CM

## 2024-12-14 DIAGNOSIS — M1A0710 Idiopathic chronic gout, right ankle and foot, without tophus (tophi): Secondary | ICD-10-CM

## 2024-12-14 DIAGNOSIS — M8589 Other specified disorders of bone density and structure, multiple sites: Secondary | ICD-10-CM

## 2024-12-14 DIAGNOSIS — M170 Bilateral primary osteoarthritis of knee: Secondary | ICD-10-CM

## 2024-12-14 DIAGNOSIS — Z8639 Personal history of other endocrine, nutritional and metabolic disease: Secondary | ICD-10-CM

## 2024-12-14 DIAGNOSIS — M19071 Primary osteoarthritis, right ankle and foot: Secondary | ICD-10-CM

## 2024-12-14 DIAGNOSIS — M359 Systemic involvement of connective tissue, unspecified: Secondary | ICD-10-CM

## 2024-12-14 LAB — CBC WITH DIFFERENTIAL/PLATELET
Absolute Lymphocytes: 1334 {cells}/uL (ref 850–3900)
Absolute Monocytes: 497 {cells}/uL (ref 200–950)
Basophils Absolute: 59 {cells}/uL (ref 0–200)
Basophils Relative: 1.1 %
Eosinophils Absolute: 292 {cells}/uL (ref 15–500)
Eosinophils Relative: 5.4 %
HCT: 42 % (ref 35.9–46.0)
Hemoglobin: 13.8 g/dL (ref 11.7–15.5)
MCH: 28.6 pg (ref 27.0–33.0)
MCHC: 32.9 g/dL (ref 31.6–35.4)
MCV: 87 fL (ref 81.4–101.7)
MPV: 12.3 fL (ref 7.5–12.5)
Monocytes Relative: 9.2 %
Neutro Abs: 3218 {cells}/uL (ref 1500–7800)
Neutrophils Relative %: 59.6 %
Platelets: 238 10*3/uL (ref 140–400)
RBC: 4.83 Million/uL (ref 3.80–5.10)
RDW: 12.8 % (ref 11.0–15.0)
Total Lymphocyte: 24.7 %
WBC: 5.4 10*3/uL (ref 3.8–10.8)

## 2024-12-14 NOTE — Telephone Encounter (Signed)
 Patient advised Waddell Craze, PA-C placed a DEXA order at [facility]. Patient verbalized understanding.

## 2024-12-14 NOTE — Patient Instructions (Signed)
 Standing Labs We placed an order today for your standing lab work.   Please have your standing labs drawn in May and every 3 months   Please have your labs drawn 2 weeks prior to your appointment so that the provider can discuss your lab results at your appointment, if possible.  Please note that you may see your imaging and lab results in MyChart before we have reviewed them. We will contact you once all results are reviewed. Please allow our office up to 72 hours to thoroughly review all of the results before contacting the office for clarification of your results.  WALK-IN LAB HOURS  Monday through Thursday from 8:00 am - 4:00 pm and Friday from 8:00 am-12:00 pm.  Patients with office visits requiring labs will be seen before walk-in labs.  You may encounter longer than normal wait times. Please allow additional time. Wait times may be shorter on  Monday and Thursday afternoons.  We do not book appointments for walk-in labs. We appreciate your patience and understanding with our staff.   Labs are drawn by [facility]. Please bring your co-pay at the time of your lab draw.  You may receive a bill from [facility] for your lab work.  Please note if you are on Hydroxychloroquine  and and an order has been placed for a Hydroxychloroquine  level,  you will need to have it drawn 4 hours or more after your last dose.  If you wish to have your labs drawn at another location, please call the office 24 hours in advance so we can fax the orders.  The office is located at [address], [address], [place], [place] [address]   If you have any questions regarding directions or hours of operation,  please call [phone].   As a reminder, please drink plenty of water prior to coming for your lab work. Thanks!

## 2024-12-14 NOTE — Telephone Encounter (Signed)
-----   Message from Susan Daniels Craze sent at 12/14/2024 11:23 AM EST ----- Can you please let the patient know that I placed a new order for a DEXA at [facility]?

## 2025-01-04 ENCOUNTER — Other Ambulatory Visit

## 2025-01-11 ENCOUNTER — Encounter: Admitting: Family Medicine

## 2025-01-11 ENCOUNTER — Ambulatory Visit: Admitting: Family Medicine

## 2025-03-11 ENCOUNTER — Ambulatory Visit: Admitting: Internal Medicine

## 2025-05-14 ENCOUNTER — Ambulatory Visit: Admitting: Physician Assistant
